# Patient Record
Sex: Male | Born: 1940 | Race: White | Hispanic: No | Smoking: Former smoker
Health system: Southern US, Community
[De-identification: ages and names within clinical notes are randomized; demographics above are authoritative.]

## PROBLEM LIST (undated history)

## (undated) DIAGNOSIS — N186 End stage renal disease: Secondary | ICD-10-CM

## (undated) DIAGNOSIS — Z936 Other artificial openings of urinary tract status: Secondary | ICD-10-CM

## (undated) DIAGNOSIS — D649 Anemia, unspecified: Secondary | ICD-10-CM

## (undated) DIAGNOSIS — N2581 Secondary hyperparathyroidism of renal origin: Secondary | ICD-10-CM

## (undated) DIAGNOSIS — I351 Nonrheumatic aortic (valve) insufficiency: Secondary | ICD-10-CM

## (undated) DIAGNOSIS — I209 Angina pectoris, unspecified: Secondary | ICD-10-CM

## (undated) DIAGNOSIS — D631 Anemia in chronic kidney disease: Secondary | ICD-10-CM

## (undated) DIAGNOSIS — N029 Recurrent and persistent hematuria with unspecified morphologic changes: Secondary | ICD-10-CM

## (undated) DIAGNOSIS — I340 Nonrheumatic mitral (valve) insufficiency: Secondary | ICD-10-CM

## (undated) DIAGNOSIS — N189 Chronic kidney disease, unspecified: Secondary | ICD-10-CM

## (undated) DIAGNOSIS — J189 Pneumonia, unspecified organism: Secondary | ICD-10-CM

## (undated) DIAGNOSIS — K529 Noninfective gastroenteritis and colitis, unspecified: Secondary | ICD-10-CM

## (undated) DIAGNOSIS — R011 Cardiac murmur, unspecified: Secondary | ICD-10-CM

## (undated) DIAGNOSIS — C679 Malignant neoplasm of bladder, unspecified: Secondary | ICD-10-CM

## (undated) DIAGNOSIS — I10 Essential (primary) hypertension: Secondary | ICD-10-CM

## (undated) DIAGNOSIS — R7303 Prediabetes: Secondary | ICD-10-CM

## (undated) DIAGNOSIS — M545 Low back pain, unspecified: Secondary | ICD-10-CM

## (undated) DIAGNOSIS — G8929 Other chronic pain: Secondary | ICD-10-CM

## (undated) DIAGNOSIS — I350 Nonrheumatic aortic (valve) stenosis: Secondary | ICD-10-CM

## (undated) DIAGNOSIS — I214 Non-ST elevation (NSTEMI) myocardial infarction: Secondary | ICD-10-CM

## (undated) DIAGNOSIS — Z8679 Personal history of other diseases of the circulatory system: Secondary | ICD-10-CM

## (undated) DIAGNOSIS — Z87442 Personal history of urinary calculi: Secondary | ICD-10-CM

## (undated) DIAGNOSIS — Z9289 Personal history of other medical treatment: Secondary | ICD-10-CM

## (undated) DIAGNOSIS — F419 Anxiety disorder, unspecified: Secondary | ICD-10-CM

## (undated) HISTORY — PX: PROSTATECTOMY: SHX69

## (undated) HISTORY — DX: Secondary hyperparathyroidism of renal origin: N25.81

## (undated) HISTORY — DX: Malignant neoplasm of bladder, unspecified: C67.9

## (undated) HISTORY — DX: Personal history of other diseases of the circulatory system: Z86.79

## (undated) HISTORY — DX: Essential (primary) hypertension: I10

## (undated) HISTORY — PX: TONSILLECTOMY: SUR1361

## (undated) HISTORY — PX: CYSTECTOMY: SUR359

---

## 2005-12-23 HISTORY — PX: ARTERIOVENOUS GRAFT PLACEMENT: SUR1029

## 2005-12-23 HISTORY — PX: URETHRECTOMY: SHX1080

## 2006-01-02 ENCOUNTER — Inpatient Hospital Stay (HOSPITAL_COMMUNITY): Admission: EM | Admit: 2006-01-02 | Discharge: 2006-01-14 | Payer: Self-pay | Admitting: Emergency Medicine

## 2006-01-02 ENCOUNTER — Ambulatory Visit: Payer: Self-pay | Admitting: Internal Medicine

## 2006-01-03 ENCOUNTER — Encounter: Payer: Self-pay | Admitting: Internal Medicine

## 2006-01-04 ENCOUNTER — Ambulatory Visit: Payer: Self-pay | Admitting: Pulmonary Disease

## 2006-01-10 ENCOUNTER — Encounter (INDEPENDENT_AMBULATORY_CARE_PROVIDER_SITE_OTHER): Payer: Self-pay | Admitting: *Deleted

## 2006-01-20 ENCOUNTER — Inpatient Hospital Stay (HOSPITAL_COMMUNITY): Admission: RE | Admit: 2006-01-20 | Discharge: 2006-01-30 | Payer: Self-pay | Admitting: Urology

## 2006-01-21 ENCOUNTER — Encounter (INDEPENDENT_AMBULATORY_CARE_PROVIDER_SITE_OTHER): Payer: Self-pay | Admitting: Specialist

## 2007-12-19 ENCOUNTER — Encounter: Admission: RE | Admit: 2007-12-19 | Discharge: 2007-12-19 | Payer: Self-pay | Admitting: Nephrology

## 2008-04-11 ENCOUNTER — Ambulatory Visit (HOSPITAL_COMMUNITY): Admission: RE | Admit: 2008-04-11 | Discharge: 2008-04-11 | Payer: Self-pay | Admitting: Urology

## 2008-05-03 ENCOUNTER — Ambulatory Visit (HOSPITAL_COMMUNITY): Admission: RE | Admit: 2008-05-03 | Discharge: 2008-05-03 | Payer: Self-pay | Admitting: Urology

## 2008-05-17 ENCOUNTER — Encounter (HOSPITAL_COMMUNITY): Admission: RE | Admit: 2008-05-17 | Discharge: 2008-08-15 | Payer: Self-pay | Admitting: Nephrology

## 2008-08-23 ENCOUNTER — Encounter (HOSPITAL_COMMUNITY): Admission: RE | Admit: 2008-08-23 | Discharge: 2008-10-24 | Payer: Self-pay | Admitting: Nephrology

## 2008-11-22 ENCOUNTER — Encounter (HOSPITAL_COMMUNITY): Admission: RE | Admit: 2008-11-22 | Discharge: 2009-02-14 | Payer: Self-pay | Admitting: Nephrology

## 2009-03-14 ENCOUNTER — Encounter (HOSPITAL_COMMUNITY): Admission: RE | Admit: 2009-03-14 | Discharge: 2009-06-12 | Payer: Self-pay | Admitting: Nephrology

## 2009-06-20 ENCOUNTER — Encounter (HOSPITAL_COMMUNITY): Admission: RE | Admit: 2009-06-20 | Discharge: 2009-09-18 | Payer: Self-pay | Admitting: Nephrology

## 2009-10-10 ENCOUNTER — Encounter (HOSPITAL_COMMUNITY): Admission: RE | Admit: 2009-10-10 | Discharge: 2010-01-08 | Payer: Self-pay | Admitting: Nephrology

## 2010-01-16 ENCOUNTER — Encounter (HOSPITAL_COMMUNITY): Admission: RE | Admit: 2010-01-16 | Discharge: 2010-04-16 | Payer: Self-pay | Admitting: Nephrology

## 2010-05-08 ENCOUNTER — Encounter (HOSPITAL_COMMUNITY): Admission: RE | Admit: 2010-05-08 | Discharge: 2010-08-06 | Payer: Self-pay | Admitting: Nephrology

## 2010-08-14 ENCOUNTER — Encounter (HOSPITAL_COMMUNITY)
Admission: RE | Admit: 2010-08-14 | Discharge: 2010-10-27 | Payer: Self-pay | Source: Home / Self Care | Attending: Nephrology | Admitting: Nephrology

## 2011-01-07 LAB — RENAL FUNCTION PANEL
BUN: 57 mg/dL — ABNORMAL HIGH (ref 6–23)
Calcium: 9.3 mg/dL (ref 8.4–10.5)
Calcium: 9.4 mg/dL (ref 8.4–10.5)
Creatinine, Ser: 5.54 mg/dL — ABNORMAL HIGH (ref 0.4–1.5)
Creatinine, Ser: 5.59 mg/dL — ABNORMAL HIGH (ref 0.4–1.5)
GFR calc Af Amer: 12 mL/min — ABNORMAL LOW (ref >60)
GFR calc non Af Amer: 10 mL/min — ABNORMAL LOW (ref >60)
Glucose, Bld: 233 mg/dL — ABNORMAL HIGH (ref 70–99)
Phosphorus: 3 mg/dL (ref 2.3–4.6)
Phosphorus: 3.1 mg/dL (ref 2.3–4.6)
Sodium: 136 mEq/L (ref 135–145)
Sodium: 136 mEq/L (ref 135–145)

## 2011-01-07 LAB — PTH, INTACT AND CALCIUM
Calcium, Total (PTH): 9.7 mg/dL (ref 8.4–10.5)
PTH: 20.2 pg/mL (ref 14.0–72.0)
PTH: 24.3 pg/mL (ref 14.0–72.0)

## 2011-01-07 LAB — POCT HEMOGLOBIN-HEMACUE: Hemoglobin: 11.6 g/dL — ABNORMAL LOW (ref 13.0–17.0)

## 2011-01-07 LAB — FERRITIN: Ferritin: 516 ng/mL — ABNORMAL HIGH (ref 22–322)

## 2011-01-07 LAB — IRON AND TIBC
Iron: 57 ug/dL (ref 42–135)
Iron: 86 ug/dL (ref 42–135)
TIBC: 280 ug/dL (ref 215–435)

## 2011-01-08 LAB — RENAL FUNCTION PANEL
Albumin: 3.5 g/dL (ref 3.5–5.2)
Chloride: 102 mEq/L (ref 96–112)
Creatinine, Ser: 5.58 mg/dL — ABNORMAL HIGH (ref 0.4–1.5)
GFR calc Af Amer: 12 mL/min — ABNORMAL LOW (ref >60)
GFR calc non Af Amer: 10 mL/min — ABNORMAL LOW (ref >60)
Phosphorus: 3.3 mg/dL (ref 2.3–4.6)
Potassium: 4.4 mEq/L (ref 3.5–5.1)

## 2011-01-08 LAB — PTH, INTACT AND CALCIUM
Calcium, Total (PTH): 9.9 mg/dL (ref 8.4–10.5)
PTH: 16.6 pg/mL (ref 14.0–72.0)

## 2011-01-09 LAB — RENAL FUNCTION PANEL
Albumin: 3.7 g/dL (ref 3.5–5.2)
CO2: 29 mEq/L (ref 19–32)
Calcium: 9.4 mg/dL (ref 8.4–10.5)
Creatinine, Ser: 5.38 mg/dL — ABNORMAL HIGH (ref 0.4–1.5)
GFR calc Af Amer: 13 mL/min — ABNORMAL LOW (ref >60)
GFR calc non Af Amer: 11 mL/min — ABNORMAL LOW (ref >60)
Sodium: 136 mEq/L (ref 135–145)

## 2011-01-09 LAB — FERRITIN: Ferritin: 596 ng/mL — ABNORMAL HIGH (ref 22–322)

## 2011-01-09 LAB — IRON AND TIBC: Iron: 65 ug/dL (ref 42–135)

## 2011-01-09 LAB — POCT HEMOGLOBIN-HEMACUE: Hemoglobin: 11.4 g/dL — ABNORMAL LOW (ref 13.0–17.0)

## 2011-01-09 LAB — PTH, INTACT AND CALCIUM: Calcium, Total (PTH): 9.5 mg/dL (ref 8.4–10.5)

## 2011-01-10 LAB — RENAL FUNCTION PANEL
Albumin: 3.8 g/dL (ref 3.5–5.2)
CO2: 27 mEq/L (ref 19–32)
CO2: 32 mEq/L (ref 19–32)
Calcium: 9.4 mg/dL (ref 8.4–10.5)
Chloride: 98 mEq/L (ref 96–112)
Creatinine, Ser: 6.12 mg/dL — ABNORMAL HIGH (ref 0.4–1.5)
GFR calc Af Amer: 11 mL/min — ABNORMAL LOW (ref >60)
GFR calc non Af Amer: 9 mL/min — ABNORMAL LOW (ref >60)
Glucose, Bld: 236 mg/dL — ABNORMAL HIGH (ref 70–99)
Potassium: 4.6 mEq/L (ref 3.5–5.1)
Sodium: 138 mEq/L (ref 135–145)
Sodium: 137 mEq/L (ref 135–145)

## 2011-01-10 LAB — IRON AND TIBC
Iron: 69 ug/dL (ref 42–135)
TIBC: 287 ug/dL (ref 215–435)
TIBC: 272 ug/dL (ref 215–435)

## 2011-01-10 LAB — POCT HEMOGLOBIN-HEMACUE
Hemoglobin: 13 g/dL (ref 13.0–17.0)
Hemoglobin: 11.5 g/dL — ABNORMAL LOW (ref 13.0–17.0)
Hemoglobin: 11.5 g/dL — ABNORMAL LOW (ref 13.0–17.0)

## 2011-01-10 LAB — FERRITIN
Ferritin: 483 ng/mL — ABNORMAL HIGH (ref 22–322)
Ferritin: 514 ng/mL — ABNORMAL HIGH (ref 22–322)

## 2011-01-11 LAB — RENAL FUNCTION PANEL
CO2: 31 mEq/L (ref 19–32)
Calcium: 9.2 mg/dL (ref 8.4–10.5)
Creatinine, Ser: 5.24 mg/dL — ABNORMAL HIGH (ref 0.4–1.5)
GFR calc Af Amer: 13 mL/min — ABNORMAL LOW (ref >60)
Glucose, Bld: 230 mg/dL — ABNORMAL HIGH (ref 70–99)

## 2011-01-11 LAB — FERRITIN: Ferritin: 455 ng/mL — ABNORMAL HIGH (ref 22–322)

## 2011-01-11 LAB — IRON AND TIBC
Iron: 62 ug/dL (ref 42–135)
TIBC: 248 ug/dL (ref 215–435)
UIBC: 186 ug/dL

## 2011-01-12 LAB — RENAL FUNCTION PANEL
Albumin: 3.4 g/dL — ABNORMAL LOW (ref 3.5–5.2)
BUN: 48 mg/dL — ABNORMAL HIGH (ref 6–23)
Creatinine, Ser: 5.53 mg/dL — ABNORMAL HIGH (ref 0.4–1.5)
Phosphorus: 3.4 mg/dL (ref 2.3–4.6)

## 2011-01-12 LAB — IRON AND TIBC
Saturation Ratios: 23 % (ref 20–55)
UIBC: 202 ug/dL

## 2011-01-12 LAB — PTH, INTACT AND CALCIUM
Calcium, Total (PTH): 9.2 mg/dL (ref 8.4–10.5)
PTH: 26.1 pg/mL (ref 14.0–72.0)

## 2011-01-12 LAB — FERRITIN: Ferritin: 435 ng/mL — ABNORMAL HIGH (ref 22–322)

## 2011-01-13 LAB — RENAL FUNCTION PANEL
Albumin: 3.4 g/dL — ABNORMAL LOW (ref 3.5–5.2)
Chloride: 99 mEq/L (ref 96–112)
Creatinine, Ser: 5.61 mg/dL — ABNORMAL HIGH (ref 0.4–1.5)
GFR calc Af Amer: 12 mL/min — ABNORMAL LOW (ref >60)
GFR calc non Af Amer: 10 mL/min — ABNORMAL LOW (ref >60)
Phosphorus: 2.9 mg/dL (ref 2.3–4.6)
Potassium: 4.5 mEq/L (ref 3.5–5.1)

## 2011-01-13 LAB — FERRITIN: Ferritin: 427 ng/mL — ABNORMAL HIGH (ref 22–322)

## 2011-01-13 LAB — IRON AND TIBC: TIBC: 254 ug/dL (ref 215–435)

## 2011-01-17 LAB — RENAL FUNCTION PANEL
Albumin: 3.4 g/dL — ABNORMAL LOW (ref 3.5–5.2)
GFR calc Af Amer: 12 mL/min — ABNORMAL LOW (ref >60)
GFR calc non Af Amer: 10 mL/min — ABNORMAL LOW (ref >60)
Glucose, Bld: 231 mg/dL — ABNORMAL HIGH (ref 70–99)
Phosphorus: 3.2 mg/dL (ref 2.3–4.6)
Potassium: 4.6 mEq/L (ref 3.5–5.1)
Sodium: 137 mEq/L (ref 135–145)

## 2011-01-17 LAB — IRON AND TIBC: TIBC: 270 ug/dL (ref 215–435)

## 2011-01-17 LAB — POCT HEMOGLOBIN-HEMACUE: Hemoglobin: 11.8 g/dL — ABNORMAL LOW (ref 13.0–17.0)

## 2011-01-17 LAB — PTH, INTACT AND CALCIUM: PTH: 20.5 pg/mL (ref 14.0–72.0)

## 2011-01-25 LAB — RENAL FUNCTION PANEL
CO2: 28 mEq/L (ref 19–32)
Chloride: 99 mEq/L (ref 96–112)
GFR calc Af Amer: 12 mL/min — ABNORMAL LOW (ref >60)
GFR calc non Af Amer: 10 mL/min — ABNORMAL LOW (ref >60)
Glucose, Bld: 236 mg/dL — ABNORMAL HIGH (ref 70–99)
Potassium: 4.1 mEq/L (ref 3.5–5.1)
Sodium: 136 mEq/L (ref 135–145)

## 2011-01-25 LAB — POCT HEMOGLOBIN-HEMACUE: Hemoglobin: 11.5 g/dL — ABNORMAL LOW (ref 13.0–17.0)

## 2011-01-25 LAB — IRON AND TIBC: Saturation Ratios: 20 % (ref 20–55)

## 2011-01-27 LAB — IRON AND TIBC
Iron: 57 ug/dL (ref 42–135)
Saturation Ratios: 22 % (ref 20–55)
UIBC: 203 ug/dL

## 2011-01-27 LAB — FERRITIN: Ferritin: 280 ng/mL (ref 22–322)

## 2011-01-28 LAB — RENAL FUNCTION PANEL
Calcium: 9.8 mg/dL (ref 8.4–10.5)
GFR calc Af Amer: 12 mL/min — ABNORMAL LOW (ref >60)
GFR calc non Af Amer: 10 mL/min — ABNORMAL LOW (ref >60)
Glucose, Bld: 203 mg/dL — ABNORMAL HIGH (ref 70–99)
Phosphorus: 3.6 mg/dL (ref 2.3–4.6)
Potassium: 4.1 mEq/L (ref 3.5–5.1)
Sodium: 141 mEq/L (ref 135–145)

## 2011-01-28 LAB — IRON AND TIBC: Iron: 62 ug/dL (ref 42–135)

## 2011-01-28 LAB — FERRITIN: Ferritin: 426 ng/mL — ABNORMAL HIGH (ref 22–322)

## 2011-01-28 LAB — POCT HEMOGLOBIN-HEMACUE: Hemoglobin: 11.4 g/dL — ABNORMAL LOW (ref 13.0–17.0)

## 2011-01-29 LAB — RENAL FUNCTION PANEL
BUN: 53 mg/dL — ABNORMAL HIGH (ref 6–23)
CO2: 29 mEq/L (ref 19–32)
Chloride: 99 mEq/L (ref 96–112)
Creatinine, Ser: 5.77 mg/dL — ABNORMAL HIGH (ref 0.4–1.5)
Glucose, Bld: 221 mg/dL — ABNORMAL HIGH (ref 70–99)
Potassium: 4.3 mEq/L (ref 3.5–5.1)

## 2011-01-29 LAB — FERRITIN: Ferritin: 424 ng/mL — ABNORMAL HIGH (ref 22–322)

## 2011-01-29 LAB — IRON AND TIBC
Iron: 54 ug/dL (ref 42–135)
TIBC: 255 ug/dL (ref 215–435)

## 2011-01-30 LAB — FERRITIN: Ferritin: 415 ng/mL — ABNORMAL HIGH (ref 22–322)

## 2011-01-30 LAB — IRON AND TIBC
Iron: 53 ug/dL (ref 42–135)
Saturation Ratios: 20 % (ref 20–55)
TIBC: 267 ug/dL (ref 215–435)
UIBC: 214 ug/dL

## 2011-01-30 LAB — RENAL FUNCTION PANEL
Albumin: 3.3 g/dL — ABNORMAL LOW (ref 3.5–5.2)
BUN: 52 mg/dL — ABNORMAL HIGH (ref 6–23)
Creatinine, Ser: 5.68 mg/dL — ABNORMAL HIGH (ref 0.4–1.5)
Glucose, Bld: 252 mg/dL — ABNORMAL HIGH (ref 70–99)
Phosphorus: 3.3 mg/dL (ref 2.3–4.6)
Potassium: 4.7 mEq/L (ref 3.5–5.1)

## 2011-01-30 LAB — POCT HEMOGLOBIN-HEMACUE: Hemoglobin: 11.2 g/dL — ABNORMAL LOW (ref 13.0–17.0)

## 2011-01-31 LAB — RENAL FUNCTION PANEL
Albumin: 3.2 g/dL — ABNORMAL LOW (ref 3.5–5.2)
BUN: 50 mg/dL — ABNORMAL HIGH (ref 6–23)
Calcium: 9.5 mg/dL (ref 8.4–10.5)
Glucose, Bld: 163 mg/dL — ABNORMAL HIGH (ref 70–99)
Phosphorus: 4.4 mg/dL (ref 2.3–4.6)
Potassium: 4.3 mEq/L (ref 3.5–5.1)
Sodium: 140 mEq/L (ref 135–145)

## 2011-01-31 LAB — FERRITIN: Ferritin: 526 ng/mL — ABNORMAL HIGH (ref 22–322)

## 2011-01-31 LAB — IRON AND TIBC
Saturation Ratios: 19 % — ABNORMAL LOW (ref 20–55)
UIBC: 221 ug/dL

## 2011-01-31 LAB — POCT HEMOGLOBIN-HEMACUE
Hemoglobin: 10.2 g/dL — ABNORMAL LOW (ref 13.0–17.0)
Hemoglobin: 8.3 g/dL — ABNORMAL LOW (ref 13.0–17.0)
Hemoglobin: 8.3 g/dL — ABNORMAL LOW (ref 13.0–17.0)

## 2011-02-01 LAB — RENAL FUNCTION PANEL
CO2: 25 mEq/L (ref 19–32)
Calcium: 9.4 mg/dL (ref 8.4–10.5)
Creatinine, Ser: 6.51 mg/dL — ABNORMAL HIGH (ref 0.4–1.5)
GFR calc Af Amer: 10 mL/min — ABNORMAL LOW (ref >60)
GFR calc non Af Amer: 9 mL/min — ABNORMAL LOW (ref >60)
Phosphorus: 3.6 mg/dL (ref 2.3–4.6)
Sodium: 139 mEq/L (ref 135–145)

## 2011-02-01 LAB — IRON AND TIBC
Saturation Ratios: 17 % — ABNORMAL LOW (ref 20–55)
UIBC: 217 ug/dL

## 2011-02-03 LAB — RENAL FUNCTION PANEL
CO2: 26 mEq/L (ref 19–32)
Calcium: 9.3 mg/dL (ref 8.4–10.5)
GFR calc Af Amer: 11 mL/min — ABNORMAL LOW (ref >60)
GFR calc non Af Amer: 9 mL/min — ABNORMAL LOW (ref >60)
Phosphorus: 3.6 mg/dL (ref 2.3–4.6)
Potassium: 4.8 mEq/L (ref 3.5–5.1)
Sodium: 135 mEq/L (ref 135–145)

## 2011-02-03 LAB — IRON AND TIBC: TIBC: 250 ug/dL (ref 215–435)

## 2011-02-03 LAB — POCT HEMOGLOBIN-HEMACUE: Hemoglobin: 11.4 g/dL — ABNORMAL LOW (ref 13.0–17.0)

## 2011-02-04 LAB — RENAL FUNCTION PANEL
Albumin: 3.3 g/dL — ABNORMAL LOW (ref 3.5–5.2)
BUN: 58 mg/dL — ABNORMAL HIGH (ref 6–23)
Creatinine, Ser: 5.62 mg/dL — ABNORMAL HIGH (ref 0.4–1.5)
GFR calc Af Amer: 12 mL/min — ABNORMAL LOW (ref >60)
Phosphorus: 3.9 mg/dL (ref 2.3–4.6)

## 2011-02-09 LAB — RENAL FUNCTION PANEL
Albumin: 3.2 g/dL — ABNORMAL LOW (ref 3.5–5.2)
Chloride: 105 mEq/L (ref 96–112)
GFR calc Af Amer: 12 mL/min — ABNORMAL LOW (ref >60)
GFR calc non Af Amer: 10 mL/min — ABNORMAL LOW (ref >60)
Potassium: 4.3 mEq/L (ref 3.5–5.1)
Sodium: 136 mEq/L (ref 135–145)

## 2011-02-09 LAB — IRON AND TIBC: UIBC: 173 ug/dL

## 2011-02-09 LAB — POCT HEMOGLOBIN-HEMACUE: Hemoglobin: 10.7 g/dL — ABNORMAL LOW (ref 13.0–17.0)

## 2011-03-12 NOTE — Op Note (Signed)
NAME:  Matthew Mays, Matthew Mays NO.:  [id number]   MEDICAL RECORD NO.:  [id number]          PATIENT TYPE:  INP   LOCATION:  NA                           FACILITY:  [facility]   PHYSICIAN:  Di Kindle. Edilia Bo, M.D.DATE OF BIRTH:  August 27, 1941   DATE OF PROCEDURE:  01/13/2006  DATE OF DISCHARGE:                                 OPERATIVE REPORT   PREOPERATIVE DIAGNOSIS:  Chronic renal failure.   POSTOPERATIVE DIAGNOSIS:  Chronic renal failure.   PROCEDURE:  Placement of new left upper arm arteriovenous graft.   SURGEON:  Di Kindle. Edilia Bo, M.D.   ASSISTANT:  Coral Ceo, P.A.-C.   ANESTHESIA:  Local with sedation.   TECHNIQUE:  The patient was taken to the operating room, sedated by  anesthesia. The left upper extremity was prepped and draped in usual sterile  fashion. After the skin was infiltrated with 1% lidocaine, a transverse  incision was made just above the antecubital level.  Here, the cephalic vein  was dissected free and was very small and not usable for a fistula. The  brachial artery was dissected free beneath the fascia and was reasonable  although somewhat small. The veins were very small and I did not think he  was a candidate for a forearm graft. Therefore, a separate longitudinal  incision was made beneath the axilla after the skin was anesthetized. The  high brachial vein here was dissected free and was a good sized vein. A 4-7  mm graft was then tunneled between the two incisions and the patient was  then heparinized. The brachial artery was clamped proximally and distally  and a longitudinal arteriotomy was made. A short segment of the 4 mm end of  the graft was excised, the graft spatulated, and sewn end-to-side to the  brachial artery using continuous 6-0 Prolene suture. The graft was then  pulled to the appropriate length for anastomosis to the high brachial vein.  The vein was ligated distally and spatulated proximally. The graft was cut  to  the appropriate length, spatulated, and sewn end-to-end to the vein using  continuous 6-0 Prolene suture. At the completion, there was a good thrill in  the graft and a palpable radial pulse. Hemostasis was obtained. The wounds  were closed with a deep layer of 3-0 Vicryl and the skin closed with 4-0  Vicryl. A sterile dressing was applied. The patient tolerated procedure well  and was transferred to the recovery room in satisfactory condition. All  needle and sponge counts were correct.      Di Kindle. Edilia Bo, M.D.  Electronically Signed     CSD/MEDQ  D:  01/13/2006  T:  01/15/2006  Job:  [id number]

## 2011-03-12 NOTE — H&P (Signed)
NAME:  Matthew Mays, Matthew Mays NO.:  [id number]   MEDICAL RECORD NO.:  [id number]          PATIENT TYPE:  INP   LOCATION:  2103                         FACILITY:  [facility]   PHYSICIAN:  Zetta Bills, MD          DATE OF BIRTH:  1940/10/28   DATE OF ADMISSION:  01/02/2006  DATE OF DISCHARGE:                                HISTORY & PHYSICAL   HISTORY OF PRESENTING ILLNESS:  This is a 70 year old man with relatively  unremarkable past medical history other than a history of delirium tremens  from alcohol withdrawal 2 years ago and currently present glaucoma.  He has  not seen at regular doctor in 2 years.   He presents with a 4-day history of profound weakness, shortness of breath,  dyspnea on exertion, and shortness of breath when lying down.  He denies any  associated fever or chills.  He reports a near daily cough with scant white  sputum production and no changes to his sputum quality or quantity in the  recent past.  He does complain of mild chest pain, as well as epigastric  pain that resolves by itself.   He reports melena and hematochezia for the past 2-3 years, and reports that  he has been noticing external hemorrhoids.  He denies any associated  hematemesis, and also denies coffee ground emesis.  He reports some mild  dyspepsia, for which he takes Tums tablets as needed.   He also has diffuse arthralgias, for which he takes 2-4 tablets of Aleve a  day, and denies any associated aspirin, BC powder or Goody's powder use.   He is incontinent of urine and has been wearing diaper pads for the past 1-2  years.  He cannot tell me clearly when he last passed urine spontaneously.  He also has associated leg swelling for the past 2 years.   PAST MEDICAL HISTORY:  1.  History of delirium tremens in the past secondary to alcohol withdrawal      and no history of seizures.  2.  History of urinary incontinence for the past 2 years.  3.  History of melena and hematochezia for  the past 2-3 years.  4.  History of dyspepsia intermittently.   PAST SURGICAL HISTORY:  Not significant.   MEDICATIONS:  1.  Some eye drops for glaucoma, of which the name he cannot remember.  2.  Naproxen as needed.   ALLERGIES:  No known drug allergies.   FAMILY AND SOCIAL HISTORY:  The patient is a current smoker.  He has been  smoking cigarettes for the past 30 years, and smokes 2 packs a day of  cigarettes.  He is a former alcohol abuser, but has been abstinent for the  past 2 years.  He lives at home with his wife and has a supportive daughter  who checks up on him.   REVIEW OF SYSTEMS:  ENT:  He denies any epistaxis.  Denies congestion.  Denies rhinorrhea.  CARDIOVASCULAR:  He denies any angina.  Denies any  claudication.  NEUROLOGIC:  He reports  some occasional orthostatic  dizziness.  He reports some headaches, and also reports some numbness of his  arms and legs.   PHYSICAL EXAMINATION:  VITAL SIGNS:  Temperature of 94.8, pulse 96, blood  pressure 119/47 (orthostatic changes), respiratory rate 32, and he is on  BiPAP at 8/12 cm of water.  He is saturating 98% on this.  GENERAL:  He is in poor general condition.  Appears uncomfortable and  fatigued.  RESPIRATORY EVALUATION:  Distant breath sounds bilaterally, prolonged  expiration and bilateral rhonchi.  He h.s. no rales.  CARDIOVASCULAR:  Pulse is regular in rate and rhythm  Heart sounds S1 and S2  are normal with a grade 3/6 ejection systolic murmur over the apex and left  lateral sternal border.  ABDOMEN:  Soft, flat, nontender, nondistended, and bowel sounds are normal.  EXTREMITIES:  He has trace edema bipedally.  NEUROLOGIC:  He is oriented to time, person and place.  Cranial nerves  appear normal on gross evaluation, and he is able to move all 4 extremities  symmetrically.   ADMISSION LABORATORIES:  Sodium of 135, potassium 7.1, chloride 107,  bicarbonate 8, BUN 140, creatinine of 13, glucose 203.  He has an  anion gap  of 20.  Total bilirubin of 1.  Alkaline phosphatase of 93.  AST of 63, ALT  of 35.  Total protein 5.5 and albumin 2.3.  Hemoglobin of 2.6, hematocrit of  8.3.  White cell count of 16.3.  Platelets of 672.  He has an MCV of 82.  Red cell distribution weight of 21%.  Lipase of 119.  Arterial blood gas  shows a pH of 7.22, PCO2 of 20, PO2 of 287, bicarbonate of 8, and oxygen  saturation of 100%.   Chest x-ray shows mild cardiomegaly with a right infrahilar infiltrate  versus scar atelectasis.   ASSESSMENT AND PLAN:  1.  Respiratory distress.  An arterial blood gas has been checked, and based      on his clinical criteria, the patient will undergo endotracheal      intubation for ventilator support.  He is currently on BiPAP, but it is      anticipated that he will get progressively fatigued, and with worsening      of shortness of breath due to metabolic acidosis.  The patient will be      intubated any supported on the ventilator for a more stable medical      course.  From his chest x-ray and lung exam, as well as history, he has      no acute intrapulmonary pathology going on, but with this profound      anemia that he presents with, he likely has a supply to demand (V/Q)      mismatch.  2.  Anemia.  This is likely a combination of ongoing GI loss, as well as      preexisting chronic kidney disease.  The patient will be transfused with      2 units of packed red blood cells that are O negative in order to      rapidly stabilize him.  This was accomplished by the emergency room      prior to my arrival there.  The patient will be closely monitored for      his volume status, as well as his potassium concentration.  Guaiac exam      of this patient was positive for dark greasy stools.  Rectal exam also  showed a diffusely enlarged smooth prostate gland.  The patient will      lateral on need erythropoietin and intravenous iron, depending on his     iron studies.  3.   Elevated BUN and creatinine.  Based on his history, as well as      concomitant albumin and anemia that is evident on admission, the patient      likely has chronic kidney disease with an acute exacerbation.  A Foley      catheter was placed in this patient and obtained 1.5 liters of cloudy      dark yellow urine.  This my indicate that he may have an obstructive      uropathy, and it will be verified by obtaining a renal ultrasound after      he is more medically stable.  The presence of an analgesic nephropathy      or a primary glomerulonephritis cannot be ruled out at this time.  A      renal consult was obtained on an earlier basis, and Dr. Arlean Hopping was      promptly informed, and he came and placed a right femoral double lumen      catheter for urgent intermittent hemodialysis, in order to help with the      reduction of his uremia, as well s improvement of his hyperkalemia.  4.  Hyperkalemia, likely secondary to chronic kidney disease/renal      insufficiency.  The patient was acutely stabilized in the emergency room      using intravenous calcium gluconate, intravenous bicarbonate, and      intravenous 50% dextrose and insulin in order to acutely decrease his      potassium.  Kayexalate enema 50 gm was prescribed while in the emergency      room, but this was not received by the patient before transfer to the      intensive care unit.  Definitive management was accomplished with      hemodialysis.  5.  Past history of ethanol abuse.  The patient will be empirically started      on intravenous thiamine and folic acid.  6.  Prophylaxis.  For deep vein thrombosis, the patient will be started on      PS hose, and for GI prophylaxis, he will      be started on proton pump inhibitors initially intravenously twice a day      because of his risk of uremic gastropathy, and this can later be      transitioned over to once a day and converted to oral route when he able      to take  so.      Zetta Bills, MD     JP/MEDQ  D:  01/02/2006  T:  01/03/2006  Job:  [id number]

## 2011-03-12 NOTE — Discharge Summary (Signed)
NAME:  Matthew Mays NO.:  [id number]   MEDICAL RECORD NO.:  [id number]          PATIENT TYPE:  INP   LOCATION:  5530                         FACILITY:  [facility]   PHYSICIAN:  Sherin Quarry, MD      DATE OF BIRTH:  1941/07/15   DATE OF ADMISSION:  01/02/2006  DATE OF DISCHARGE:                                 DISCHARGE SUMMARY   Matthew Mays is a 70 year old man with a past history of prolonged alcohol  abuse and delirium tremens but no recent history of alcohol abuse. He had  not seen a physician for a number of years. He presented on January 02, 2006  with a four-day history of weakness, shortness of breath, dyspnea on  exertion and nausea. He had been taking a number of tablets of Aleve, BC  powders and Goody powders because of diffuse arthralgias. He had been  chronically incontinent of urine and been wearing diapers because of this  for the last two years.   On physical exam, he was noted to have a temperature 94.8. His blood  pressure was 119/47. He was experiencing respiratory distress and was on  BiPAP. Chest exam revealed diminished breath sounds diffusely with prolonged  expiratory wheezes and rhonchi. Cardiovascular exam revealed a grade 3/6  systolic murmur. The abdomen was flat. It was nondistended. Bowel sounds  were normal. Neurologic testing and examination of the extremities was  normal.   Potassium was 7.1, bicarbonate was 8, creatinine was 13, BUN was 140. White  cell count was 16,300. Arterial blood gas showed pH of 7.22, pCO2 of 20, pO2  of 287, bicarbonate of 8.   Because of the profound respiratory distress, the patient was intubated and  placed on a ventilator. He was transfused two units of blood. A Foley  catheter was placed because of concern about bladder outlet obstruction and  1.5 liters of dark urine was drained. In light of his hyperkalemia, he was  given intravenous calcium bicarbonate, dextrose insulin, and Kayexalate  enema was  given. A consultation was obtained from Dr. Arlean Hopping of the renal  service. He recommended the patient receive acute hemodialysis. Dialysis was  administered on the day of admission. The patient was carefully monitored in  the critical care unit.   By January 05, 2006, the patient was extubated and was not experiencing any  respiratory distress. He was seen in consultation by the urology service and  it was felt that eventually he was going to need a cystoscopy. CT scan of  the abdomen and pelvis was performed which did not show any surprising  findings. It showed that the bladder was very thick walled and irregular  with calcifications within the bladder wall. It was thought that this could  represent a transitional cell carcinoma. The patient's renal function slowly  improved. By January 05, 2006, the creatinine was down to 7.3. By January 07, 2006, it was down to 6.1. The patient became increasingly alert and was able  to tolerate a regular diet. Unfortunately, the creatinine seemed to plateau  at about 6.1. On  January 10, 2006, the patient was transported to [facility]  and received a cystoscopy with biopsy. At this point, we are awaiting the  results of the cystoscopy which perhaps will be back by Wednesday.   DIAGNOSES:  1.  As of January 10, 2006, bladder outlet obstruction secondary to diffusely      calcified poorly functioning bladder.  2.  Acute renal failure secondary to bladder outlet obstruction.  3.  Acute respiratory failure secondary to renal failure.  4.  Anemia secondary to renal failure.  5.  Severe hyperkalemia secondary to renal failure.  6.  History of alcohol abuse with delirium tremens in the past.  7.  A 100 pack-year smoking history.  8.  Chronic obstructive pulmonary disease.   MEDICATIONS AT THE TIME OF THIS DICTATION:  1.  Oracit solution 30 mL t.i.d.  2.  Protonix 40 milligrams b.i.d.  3.  Ferrous sulfate 325 milligrams every 8 hours.  4.  Calcium carbonate 500  milligrams 3 tablets t.i.d.  5.  Nephro-Vite one daily/  6.  Aranesp 200 mcg weekly.  7.  Hectorol 2.5 mcg daily.  8.  Sliding scale insulin.  9.  Albuterol by nebulization q.4h. p.r.n.   The patient's condition is fair. Subsequent treatments are pending.           ______________________________  Sherin Quarry, MD     SY/MEDQ  D:  01/10/2006  T:  01/11/2006  Job:  [id number]   cc:   Danice Goltz, M.D. [facility]  [address] [place], [place] [address]   Dyke Maes, M.D.  Fax: [phone]

## 2011-03-12 NOTE — Discharge Summary (Signed)
NAME:  Matthew Mays, Matthew Mays                  ACCOUNT NO.:  [id number]   MEDICAL RECORD NO.:  [id number]          PATIENT TYPE:  INP   LOCATION:  5530                         FACILITY:  [facility]   PHYSICIAN:  Kela Millin, M.D.DATE OF BIRTH:  12-20-1940   DATE OF ADMISSION:  01/02/2006  DATE OF DISCHARGE:  01/14/2006                                 DISCHARGE SUMMARY   ADDENDUM:  This is an addendum to the hospital course.  The patient was seen  by [facility] and Dr. Edilia Bo did a left upper extremity A-V graft on January 13, 2006.  The patient tolerated the procedure without any complications.  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  Dr. Earlene Plater, urologist, saw the patient and discussed a  cystectomy and ileal loop in detail with the patient and he agreed to  proceed.  Dr.  Earlene Plater stated that the patient will be readmitted to the  hospital on Thursday, January 20, 2006, which is the day before the surgery  for bowel prep.  The surgery was scheduled for Friday, January 21, 2006.  The  patient was to go home with his Foley catheter and he was instructed on how  to irrigate it prior to discharge.  Per Dr. Earlene Plater, his office to handle the  logistics of the surgery and okay to discharge the patient.  Nephrology also  saw the patient today and stated that it was okay to discharge the patient  home and that they will see the patient back again when he is readmitted to  the hospital on Thursday, January 20, 2006.  The patient has remained afebrile  and hemodynamically stable and he will be discharged at this time to follow  up as already discussed.   DISCHARGE MEDICATIONS:  1.  Cipro one p.o. b.i.d. beginning Monday, January 17, 2006.  2.  Oracit 30 cc t.i.d.  3.  Hectorol 2.5 mcg one daily.  4.  Protonix 40 mg one b.i.d.  5.  Nephro-Vite one p.o. daily.  6.  Iron 325 p.o. t.i.d.  7.  Albuterol MDI two puffs q.4-6h. p.r.n.  8.  Percocet 5 mg, 1-2 tablets p.o.  q.4-6h. p.r.n.   FOLLOWUP CARE:  1.  Dr. Earlene Plater as above.  2.  Primary care physician in one week.  3.  Nephrology as above.   DISCHARGE CONDITION:  Improved, stable.   DISCHARGE DIET:  Renal diet.      Kela Millin, M.D.  Electronically Signed     ACV/MEDQ  D:  01/14/2006  T:  01/14/2006  Job:  [id number]   cc:   Dyke Maes, M.D.  Fax: [phone]   Lucrezia Starch. Earlene Plater, M.D.  Fax: [phone]

## 2011-03-12 NOTE — Consult Note (Signed)
NAME:  Matthew Mays, Matthew Mays NO.:  [id number]   MEDICAL RECORD NO.:  [id number]          PATIENT TYPE:  INP   LOCATION:  2103                         FACILITY:  [facility]   PHYSICIAN:  Maree Krabbe, M.D.DATE OF BIRTH:  27-Aug-1941   DATE OF CONSULTATION:  01/02/2006  DATE OF DISCHARGE:                                   CONSULTATION   REFERRING PHYSICIAN:  Dr. Danice Goltz.   PRIMARY CARE PHYSICIAN:  Dr. Jayme Cloud.   REASON FOR CONSULTATION:  Elevated creatinine.   HISTORY:  The patient is a 70 year old white male with prior history of  alcohol abuse and delirium tremens with no drinking in the last 2 years.  He  has not seen a doctor for 2 years.  He has had progressive fatigue,  shortness of breath and dyspnea on exertion.  When he presented to the  emergency room today, he was found to have profound azotemia and profound  anemia, also hyperkalemia and severe metabolic acidosis.  The patient was  intubated and admitted to the ICU.  Foley catheter was placed with some  difficulty and returned over 1 L of urine, which was brown with lots of  sediment and turbid.  The patient, before he was intubated, reported some  bloody stools off and on for the past 2 years, some dyspepsia and  arthralgias for which he takes 2-4 Aleve per day.   PAST MEDICAL HISTORY:  1.  Alcohol withdrawal and DTs in the past, no alcohol abuse for the last 2      years.  2.  History of glaucoma.   PAST SURGICAL HISTORY:  None.   MEDICATIONS:  P.r.n. NSAIDS.   ALLERGIES:  None.   SOCIAL HISTORY:  Two-pack-a-day smoker for 30 years, former alcohol abuse.  Lives at home with wife, supportive daughter.   REVIEW OF SYSTEMS:  GENERAL:  No fever, chills or sweats.  ENT:  No  epistaxis, sore throat or difficulty swallowing.  CARDIORESPIRATORY:  No  chest pain, otherwise as above.  GI:  No nausea, vomiting or diarrhea.  GU:  He has been incontinent of urine for over a year.  NEUROLOGIC:  No  history  of stroke, TIA or seizure.  No focal numbness or weakness.  ENDOCRINE:  No  heat or cold intolerance.   PHYSICAL EXAMINATION:  VITAL SIGNS:  Temperature 94.8, pulse 96,  respirations 32, prior to intubation, blood pressure 120/50.  GENERAL:  This is a disheveled-appearing older male who is currently on the  ventilator  SKIN:  Without rash.  HEENT:  PERRL.  EOMI.  NECK:  No JVD.  CHEST:  Clear throughout.  CARDIAC:  Regular rate and rhythm with a 2/6 systolic ejection murmur, no  rub or gallop.  ABDOMEN:  Soft, flat and nontender.  Active bowel sounds.  No organomegaly.  EXTREMITIES:  No edema.  No ulceration.  Good distal perfusion in the feet.  NEUROLOGIC:  Moves all 4 extremities symmetrically.  He is lethargic and  confused.   LABORATORY DATA:  Sodium 135, potassium 7.1, CO2 8, BUN 140, creatinine 13,  anion gap 20.  Hemoglobin 2.6, hematocrit 8%, white blood count 16,000.  Blood gas:  7.22/20/287.  Albumin 2.3.   RADIOLOGIC FINDINGS:  Chest x-ray:  No acute disease.   IMPRESSION:  1.  Severe azotemia of uncertain duration, possible obstructive nephropathy      due to large prostate.  2.  Urinary retention with enlarged prostate on exam by the resident.  Over      1 L of urine on initial Foley catheter placement.  3.  Severe anemia due to possible chronic kidney disease and possible      gastrointestinal bleed.  4.  Severe hyperkalemia with QRS prolongation.  5.  Metabolic acidosis.   RECOMMENDATIONS:  1.  Foley catheter is already in place.  Acute treatment for increased      potassium has been given with insulin, glucose, bicarb and IV calcium.  2.  Renal ultrasound and urinalysis to start the renal workup and look for      chronic hydronephrosis.  3.  Acute hemodialysis with transfusion for life-threatening hyperkalemia      and metabolic acidosis.      Maree Krabbe, M.D.  Electronically Signed     RDS/MEDQ  D:  01/02/2006  T:  01/04/2006  Job:   [id number]

## 2011-03-12 NOTE — Op Note (Signed)
NAME:  Matthew Mays, Matthew Mays NO.:  [id number]   MEDICAL RECORD NO.:  [id number]          PATIENT TYPE:  INP   LOCATION:  5530                         FACILITY:  [facility]   PHYSICIAN:  Ronald L. Earlene Plater, M.D.  DATE OF BIRTH:  10-26-1940   DATE OF PROCEDURE:  01/10/2006  DATE OF DISCHARGE:                                 OPERATIVE REPORT   PREOPERATIVE DIAGNOSIS:  Renal failure, urinary retention, gross hematuria,  calcified inner mucosa of the bladder on CT scan, bilateral hydronephrosis.   POSTOPERATIVE DIAGNOSIS:  Renal failure, urinary retention, gross hematuria,  calcified inner mucosa of the bladder on CT scan, bilateral hydronephrosis.   OPERATION PERFORMED:  Cystourethroscopy, irrigation of bladder debris and  bladder biopsy.   SURGEON:  Lucrezia Starch. Earlene Plater, M.D.   ANESTHESIA:  MAC.   ESTIMATED BLOOD LOSS:  30 mL.   COMPLICATIONS:  None.   TUBES:  23 French Foley.   INDICATIONS FOR PROCEDURE:  [name] is a very nice 70 year old white male  who presented in renal failure.  He was found to have bilateral severe  hydronephrosis with cortical atrophy and urinary retention.  A catheter was  passed and on CT scanning he was found to have diffuse mucosal inner lining  of his bladder with very thick walled bladder and bilateral hydronephrosis.  His creatinine is stabilized at 6.  After understanding risks, benefits and  alternatives, he has elected to proceed with cysto with bladder biopsy.   DESCRIPTION OF PROCEDURE:  The patient was placed in supine position and  after proper MAC analgesia, was placed in the dorsal lithotomy position and  prepped and draped with Betadine in sterile fashion.  Cystourethroscopy was  performed.  He really did not appear to have obstruction at all from the  prostatic urethra and the bladder was full of what appeared to be necrotic  debris with much calcification which was felt to be dystrophic  calcification.  Utilizing a Toomey syringe,   it was irrigated out and  appeared to be a combination of tissue and calcification and was submitted  to pathology.  Inspection revealed a diffusely inflamed thickened bladder  with oozing throughout.  The trigone could not even be visualized.  Therefore biopsy was taken at the posterior midline and the base was  cauterized with Bovie coagulation cautery.  I felt there was nothing else to  do at that point. Therefore, the  panendoscope was removed and a 22 [place] Foley catheter was passed with 15  mL in its balloon and the bladder was irrigated to a pink color.  It was  felt that there were no significant bleeders, it was just diffuse oozing and  that no further irrigation was indicated.  The patient was taken to the  recovery room stable.      Ronald L. Earlene Plater, M.D.  Electronically Signed     RLD/MEDQ  D:  01/10/2006  T:  01/11/2006  Job:  [id number]   cc:   Fayrene Fearing L. Deterding, M.D.  Fax: [phone]

## 2011-03-12 NOTE — Consult Note (Signed)
NAME:  Matthew Mays, Matthew Mays NO.:  [id number]   MEDICAL RECORD NO.:  [id number]          PATIENT TYPE:  INP   LOCATION:  5530                         FACILITY:  [facility]   PHYSICIAN:  Di Kindle. Edilia Bo, M.D.DATE OF BIRTH:  1941/01/08   DATE OF CONSULTATION:  01/11/2006  DATE OF DISCHARGE:                                   CONSULTATION   REASON FOR CONSULTATION:  Need for hemodialysis access. Consult is from Dr.  Darrick Penna.   HISTORY:  This is a pleasant 70 year old gentleman who was admitted on January 02, 2006. He was found to have acute on chronic renal insufficiency.  Vascular surgery was consulted for placement of long-term hemodialysis  access. Of note he is right-handed.   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 He denies any history of diabetes, hypertension,  hypercholesterolemia, history of previous cardiac disease.   ALLERGIES:  He has no known drug allergies.   REVIEW OF SYSTEMS:  He has had no recent chest pain, chest pressure,  palpitations or arrhythmias. He has had no leg pain or arm pain.   PHYSICAL EXAMINATION:  Temperature is 97.8, blood pressure 106/72. Lungs are  clear bilaterally to auscultation. On vascular exam, he has palpable  brachial and radial pulse bilaterally. He appears to have a usable forearm  cephalic vein on the left.   We will map his cephalic vein on the left arm and evaluate him for possible  AV fistula placement. If the vein is not adequate, then we will place an AV  graft in the left arm. I have also explained that we may have to make a  decisions at the time of surgery. I have discussed the indications for  surgery and potential complications including but not limited to bleeding,  arm swelling, failure of the fistula to mature, graft thrombosis, graft  infection, and steal syndrome. All of his questions were answered and he is  agreeable to proceed. His surgery  has been scheduled for Thursday January 13, 2006.      Di Kindle. Edilia Bo, M.D.  Electronically Signed     CSD/MEDQ  D:  01/11/2006  T:  01/12/2006  Job:  [id number]

## 2011-03-12 NOTE — Consult Note (Signed)
NAME:  Matthew Mays, BASSO NO.:  [id number]   MEDICAL RECORD NO.:  [id number]          PATIENT TYPE:  INP   LOCATION:  1432                         FACILITY:  [facility]   PHYSICIAN:  Terrial Rhodes, M.D.DATE OF BIRTH:  December 24, 1940   DATE OF CONSULTATION:  DATE OF DISCHARGE:                                   CONSULTATION   REASON FOR CONSULTATION:  Chronic renal failure.   HISTORY OF PRESENT ILLNESS:  Matthew Mays is a 70 year old white male who was  admitted to [facility] on January 02, 2005 when he was noted to have  acute-on-chronic renal failure as well as weakness, shortness of breath.  His creatinine was noted to be 13, BUN 140, potassium 7.1.  He also was in  respiratory distress.  He underwent dialysis emergently with volume removal  to treat his hyperkalemia.  Workup revealed significant bilateral  hydronephrosis with calcified mass in the region of the bladder, possibly  the prostate.  He subsequently underwent biopsy of the mass.  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  Has had chronic obstruction of his kidneys due  to this mass and subsequently developed progressive renal failure.  He has  been off of dialysis now for two weeks, and his most recent creatinine was  5.5.  He has advanced chronic kidney disease, likely due to chronic  obstruction from this malignancy.  He underwent an AV graft placement in his  left upper arm on January 13, 2006 by Dr. Edilia Bo.  He was admitted today by  Dr. Earlene Plater before they proceed with a cystectomy and ileal diversion.  Overall, he has been feeling well and has not been taking any pain  medications.  His Foley catheter was in place and draining bloody urine.   He has no known drug allergies.   PAST MEDICAL HISTORY:  1.  As above.  2.  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  3.  Bladder cancer.  4.  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  5.  Metabolic acidosis secondary to renal failure.  6.  Anemia.  7.  Secondary hypothyroidism.  8.  Status post left upper arm AV graft on January 13, 2006.  9.  Status post failure of his AV fistula that was placed on January 13, 2006.   CURRENT MEDICATIONS:  1.  Oracit 30 cc t.i.d.  2.  Hectorol 2.5 mcg daily.  3.  Protonix 40 mg b.i.d.  4.  Nephro-Vite 1 daily.  5.  Iron sulfate 325 mg a day.  6.  Albuterol metered dose inhalers p.r.n.  7.  Percocet p.r.n.   FAMILY HISTORY:  Noncontributory.  No family history of kidney disease.   SOCIAL HISTORY:  He lives at home with his wife.  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  He has a 60-pack-  year tobacco history but quit since his admission in March.  He quit alcohol  abuse three years ago.  No drugs.   REVIEW OF SYSTEMS:  In general, patient denies any anorexia, malaise.  OPHTHALMIC:  No blurred vision or photophobia.  CARDIAC:  No chest pain or  palpitations, orthopnea, PND.  PULMONARY:  No shortness of breath,  hemoptysis, productive cough.  GI:  No nausea or vomiting, hematochezia,  melena, bright red blood per rectum.  GU:  He has blood urine in his Foley  catheter.  All systems negative.   PHYSICAL EXAMINATION:  VITAL SIGNS:  Temperature 98.6, pulse 115, blood  pressure 114/72, respiratory rate 20.  GENERAL:  A frail, chronically ill-appearing man in no apparent distress.  HEENT:  Head normocephalic and atraumatic.  Pupils are equal, round and  reactive to light.  Extraocular muscles are intact.  No icterus.  Oropharynx  without lesions.  NECK:  Supple.  No lymphadenopathy.  No bruits.  LUNGS:  Clear to auscultation and percussion bilaterally.  No rales, rhonchi  or rub.  CARDIAC:  Regular rate and rhythm.  No precordial rub appreciated.  ABDOMEN:  He has a ventral hernia.  Normoactive bowel sounds.  Soft,  nontender, nondistended.  EXTREMITIES:  No clubbing, cyanosis  or edema.  He has a left upper arm AV  graft with palpable thrill.  No audible bruit.  No guarding or rebound.   LABS:  White blood cell count 7.6, hemoglobin 12.5, platelets 301.  Labs are  pending.   ASSESSMENT/PLAN:  1.  Bladder cancer with bladder outlet obstruction:  Biopsy revealed a      papillary urothelial carcinoma as well as the necrotic debris and      multiple areas of calcification.  He is due for a cystectomy and      diverting ileostomy tomorrow by Dr. Earlene Plater.  2.  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  Most      recent creatinine was 5.5 on January 14, 2006.  He has not had dialysis in      two weeks.  Given some return of his renal function.  Will await his      creatinine today.  If there are no uremic symptoms, will continue to      follow closely.  His AV graft is not ready for use.  3.  Vascular access:  As above.  AV graft, palpable thrill.  Audible bruit.      Only one week old.  4.  Hypertension:  Blood pressure is stable.  5.  Anemia:  We will continue to follow his hemoglobin postoperatively and      continue with iron supplements.  6.  Secondary hyperparathyroidism:  He is on vitamin D.  Will follow his      calcium and phosphorus, and then check PTH.  7.  Gastroesophageal reflux disease:  Continue his Protonix.  8.  Chronic obstructive pulmonary disease:  Will continue with albuterol      metered dose inhalers.  9.  Metabolic acidosis:  Will continue on Oracit.  10. Fluids, electrolytes, nutrition:  Will continue with his renal diet      until he is n.p.o. after midnight, at which time will adjust his IV      fluids as needed.  Will continue to follow his renal function.           ______________________________  Terrial Rhodes, M.D.     JC/MEDQ  D:  01/20/2006  T:  01/21/2006  Job:  [id number]

## 2011-03-12 NOTE — Op Note (Signed)
NAME:  Matthew Mays, Matthew Mays NO.:  [id number]   MEDICAL RECORD NO.:  [id number]          PATIENT TYPE:  INP   LOCATION:  1408                         FACILITY:  [facility]   PHYSICIAN:  Bertram Millard. 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   PRINCIPAL PROCEDURE:  Urethrectomy.   SURGEON:  Bertram Millard. Dahlstedt, M.D.   BRIEF HISTORY:  The patient is a 70 year old male who is undergoing  cystectomy for treatment of the significant/widespread transitional cell  carcinoma of the bladder.  During the cystectomy, a distal prostatic  urethral margin was positive for papillary transitional cell carcinoma.  Total urethrectomy is indicated.  The patient has been prepped and draped  for possible perineal procedure.   DESCRIPTION OF PROCEDURE:  A catheter was advanced through the patient's  urethra.  Scrotum was retracted anteriorly.  A midline perineal incision was  then made and carried down through the subcutaneous tissues with  electrocautery.  A [place] retractor was then placed to afford better  visualization of the urethra.  Urethra was identified just distal to the  bulbous urethra.  It was then grasped with the [facility].  Dissection was then  carried proximally and distally, circumscribing the urethra.  I was unable  to apply traction distally and proximally, thus totally dissecting the  urethra from the corporal bodies.  I then dissected proximally to the pelvic  diaphragm.  The [facility] retractor was used to gain deep exposure.  Small  bleeders were either electrocoagulated or clipped.  Dissection was then  carried up to the proximal urethra which was then dissected free from the  stump that was left.  A separate specimen, the most proximal urethra, was  sent as urethral margin for a permanent section.  Dr. Earlene Plater, from above,  then closed the pelvic floor with a Vicryl suture.  I then placed FloSeal in  the base of the incision to gain adequate hemostasis.  I then dissected the  urethra distally, inverting the penis.  Dissection was then carried up all  the way to the glands.  The distal urethra was free from the corpus  spongiosum of the glands.  In this manner, the entire urethra was sent as  urethra.  The penis was then allowed to get back to its resting position  outside the body.  Two sutures of 3-0 Monocryl were then placed in a  horizontal mattress fashion in the glandular defect to close it.  Hemostasis  was adequate.  The perineal incision was then closed in three layers.  The  inner two layers were of running, 2-0 Vicryl in simple fashion.  The skin  edges were reapproximated using a running, 4-0 Monocryl in a subcuticular  fashion.  The patient tolerated this segment of the procedure well.  Bertram Millard. Dahlstedt, M.D.  Electronically Signed     SMD/MEDQ  D:  01/21/2006  T:  01/24/2006  Job:  [id number]

## 2011-03-12 NOTE — Op Note (Signed)
NAME:  Matthew Mays, Matthew Mays NO.:  [id number]   MEDICAL RECORD NO.:  [id number]          PATIENT TYPE:  INP   LOCATION:  1432                         FACILITY:  [facility]   PHYSICIAN:  Ronald L. Earlene Plater, M.D.  DATE OF BIRTH:  April 11, 1941   DATE OF PROCEDURE:  01/21/2006  DATE OF DISCHARGE:                                 OPERATIVE REPORT   PREOPERATIVE DIAGNOSIS:  Bladder cancer.   POSTOPERATIVE DIAGNOSIS:  Bladder cancer and urothelial cancer.   OPERATION PERFORMED:  Radical cystectomy with bilateral lymph node  dissection and ileal conduit urinary diversion and urethrectomy.   SURGEON:  Lucrezia Starch. Earlene Plater, M.D. for cystectomy with bilateral lymph node  dissection and ileal conduit urinary diversion.   ASSISTANT:  1.  Bertram Millard. Retta Diones, M.D.  2.  Cornelious Bryant, MD   For the urethrectomy, surgeon is Dr. Retta Diones.   ANESTHESIA:  General.   INDICATIONS FOR PROCEDURE:  This is a 70 year old gentleman with a history  of bilateral hydronephrosis that is due to low grade [facility] affecting a big  portion of the bladder mucosa.  After extensive counseling, the patient  elected for a radical cystectomy with ileal conduit diversion.  The patient  is known to have renal failure secondary to his hydronephrosis and he is  dialysis dependent.   DESCRIPTION OF PROCEDURE:  The patient was admitted to the hospital the day  before surgery.  Nephrology and the hospital services were consulted. After  a bowel prep, the patient was taken the next day to operating room where  general anesthesia was induced.  Preop antibiotics were given.  He was  placed in the dorsal lithotomy position.  His perineal and abdominal area  were prepped and draped in the normal sterile fashion.  Time out was taken  to properly identify the patient and the procedure to be done.  A low  midline incision was made below the umbilicus.  Dissection was carried out  through Scarpa's fascia and anterior rectus fascia  through the rectus  muscles.  The peritoneal cavity was then entered.  Inspection of the  peritoneal cavity did not reveal any gross abdominal seeding that could be  detected.  The bowels were reflected anteriorly.  The vas deferens initially  on the right side was identified and clipped and ligated.  Theposteriorreflection of the peritoneum was then incised above the right  ureter which was in close proximity to the pelvic artery and vein which  facilitated__________.  The ureter on the right side was noted to be  excessively dilated due to the patient's known bilateral ureteral  hydronephrosis.  The posterior peritoneal covering was then incised and then  the right ureter was freed up proximally.  The right ureter was then traced  distally to the level of the bladder and was clipped and ligated  posteriorly.  The distal margin of the right ureter was then sent for frozen  section for identification of possible tumor involvement.  Again  identification of the left ureter was then made after clipping and cutting  the left vas deferens.  The  left ureter was then freed proximally and  distally to the level of the bladder.  The distal portion of the left ureter  was clipped and cut.  A portion of the distal left ureter was then sent for  the frozen section for possible involvement of bladder tumor.  The left  ureter was also noted to be grossly hydronephrotic.  Decision at that point  was made to continue with the cystectomy and to postpone lymph node  dissection until the bladder was out.  The bladder pedicles were then taken  down using the LigaSure, starting from the base of the bladder posteriorly  and gradually moving anterior to the apex of the prostate.  This was done on  the right and left side sequentially.  After the posterior aspect of the  bladder was freed up, dissection was then started anteriorly at the level of  the apex of the bladder.  The dorsal venous complex was initially  ligated  and cut.  Due to a considerable amount of oozing through the dorsal venous  complex, the catheter that was inserted the beginning of the case through  the penis was taken out and the LigaSure was applied across the urethra and  the venous complex and thus the urethra was divided that way.  This  effectively freed the surgical specimen which included the bladder and the  prostate.  This was sent for permanent pathological specimen with a portion  of the anterior prostatic urethra was sent for frozen section for immediate  identification of possible tumor involvement.  At this point, lymph node  dissection was done on the right and left side sequentially.  During this  process, the pathologist called intraoperatively, called into the operating  room and relayed the message that both distal ureteral samples were negative  for tumor involvement, however, the prostatic urethra was involved with  tumor.  Decision at that point was to proceed with urethrectomy which was  performed by Dr. Retta Diones, whose note will be dictated separately.  So the  lymph node dissection on the right side was started by freeing the lymph  node just above the right iliac artery and dissection of the lymph node  carried down to the level below the right iliac vein and was taken laterally  through to the pelvic wall on the right side.  The dissection was carried  anterior to the level of the obturator nerve.  Distally, the dissection was  taken to the node ofwhere the iliac artery and vein go into the femoral  canal.  It was noted that during the lymph node dissection the right iliac  vein was bleeding secondary to a small puncture hole that was fixed with 4-0  Prolene uneventfully.  The right lymph node packet was then sent to  permanent pathological evaluation.  The same lymph node dissection was then  carried out on the left side.  At this point the hemostasis was adequately in place in the pelvis using  electrocautery and Surgicel.  Attention was  then given to creating the ileal conduit.  About 15 cm from the ileocecal  junction, a 15 cm piece of ileum was identified.  This piece of ileum was  then cut using staples.  The ileal continuity was then re-established again  using staples.  The proximal portion of the ileum was then used to  anastomose the ureters.  Prior to anastomosing the ureters the left ureter  was tunneled behind the bowel mesentery to the right  side.  Both the  ureteral walls were tagged to the ileal loop wall a few centimeters proximal  to the end of both ureters to aid in the anastomosis of the ureters.  A  small circular piece of serosa and mucosal stuck into the ileum close to the  left distal ureteral opening.  The ureter and the newly made ileal defect  was then anastomosed together using 4-0 chromic in a running fashion.  Prior  to complete closure of the suture line, a single J stent was inserted into  the left kidney.  The distal portion of the ileal conduit where the staple  line was done was then cut  and the distal portion of the stent was  externalized for that portion.  Again, the same anastomosis was then carried  out on the right side with insertion of a single J stent in the right  ureter.  The distal end of the ureteral stent was externalized through the  distal portion of the ileal conduit.  The marking on the right upper  quadrant done by the ostomy nurses the night before was then used to create  a circular skin defect where the ostomy would finally exit.  Dissection was  then carried down to the level of the fascia where the cruciate incision was  made in the anterior rectus fascia.  The ileal conduit was then tunneled  through the fascial defect.  2-0 Vicryl sutures were then used to attach the  ileal conduit seromuscular to the anterior rectus fascia in order to keep  the ileal conduit in position.  Care was taken not to pull the ureteral  stents  during the maneuver of tunneling the ileal conduit through the ostomy  wall defect.  The ileal mucosa was then everted at the level of the skin  using 3-0 chromic.  The ostomy was then matured using multiple interrupted  chromic sutures.  At this point hemostasis was then inspected in the pelvis  with no evidence of active bleeding.  The abdominal cavity was then  copiously irrigated with antibiotic solution.  Dr. Retta Diones at this point  finished his urethrectomy with no complications. The abdominal wound was  then closed using 1-0 PDS.  The skin was then closed using staples.  An  ostomy back appliance was applied.  Prior to closure of the wound, Al Pimple drain was inserted in the left lower quadrant.  The Jackson-Pratt  drain was put in the dependent portion of the pelvic cavity.  The Al Pimple pelvic drain was sutured in place using silk.  An ostomy appliance was  applied to the newly made ileal  conduit.  At end of the procedure, the ileal conduit mucosa looked viable and healthy.  The wound was dressed  appropriately and the patient was taken out of the supine position and  awakened from anesthesia with no complication.   DISPOSITION:  The patient was taken to post anesthesia care unit in stable  condition.  Please note that Dr. Earlene Plater and Dr. Retta Diones both were present  and participated in the entire procedure as they were the primary surgeons.     ______________________________  Terie Purser, MD      Lucrezia Starch. Earlene Plater, M.D.  Electronically Signed    JH/MEDQ  D:  01/24/2006  T:  01/25/2006  Job:  [id number]

## 2011-03-12 NOTE — Discharge Summary (Signed)
NAME:  Matthew Mays, Matthew Mays NO.:  [id number]   MEDICAL RECORD NO.:  [id number]          PATIENT TYPE:  INP   LOCATION:  1419                         FACILITY:  [facility]   PHYSICIAN:  Lucrezia Starch. Earlene Plater, M.D.  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   PROCEDURES:  Cystoprostatectomy with urethrectomy with ileal conduit  diversion done on January 21, 2006.   DISCHARGE DIAGNOSES AND COMORBIDITIES:  1.  Bladder cancer.  2.  Chronic renal disease.  3.  Acidosis.  4.  Gastroesophageal reflux.  5.  Anemia.  6.  Hypertension.  7.  Secondary hypothyroidism.   HISTORY OF PRESENT ILLNESS AND HOSPITAL COURSE:  Matthew Mays is a 70 year old  gentleman who was seen and evaluated on January 02, 2005 when he was admitted  to [facility] with acute on chronic renal failure.  Work-up of his renal  failure to rule out bilateral hydronephrosis with calcified mass in the  region of the bladder and possibly the prostate.  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  He was then thought to have  diffuse bladder cancer causing bilateral ureteral obstruction with  subsequent renal failure.  After extensive counseling the patient elected  for cystectomy and ileal conduit diversion.  He was also consented for the  possibility of a urethrectomy should his urethral margins be positive for  cancer.  Patient was admitted on January 20, 2006 where he was preoperative  and received a bowel prep the day before surgery.  He was also seen by the  nephrology services preoperatively.  His creatinine on admission was noted  to be 6.  The patient underwent the second day cystoprostatectomy and  intraoperatively  his urethral margin was noted positive for cancer for which  he underwent a urethrectomy.  The patient had a successful procedure  __________.  He received bilateral lymph node dissection and an ileal  conduit during that procedure.  Postoperatively he was observed in the ICU  for the immediate postoperative period after which the patient was  transferred to the floor.  His postoperative course was relatively smooth  and was eventful for two minor incidents of diarrhea and some wound redness.  The diarrhea was investigated for Clostridium difficile and was negative.  The patient's wound redness was thought to be minor wound infection with no  evidence for dehiscence for which the patient was started on Keflex.  His  creatinine during the entire hospitalization slowly trended down and the  patient was discharged with a creatinine 5 on January 30, 2006.  It should be  noted that the patient's pain was very well controlled throughout his  hospital stay.  Once his bowels started moving he was given a renal diet  which he tolerated very well.  On the day of  discharge the patient was  afebrile.  His vital signs were stable.  He was awake, alert, and oriented.  His abdomen was soft and nontender.  His ostium was pink and viable.  He had  clear breath sounds bilaterally.  His wounds were clean, dry, and intact  with some area of redness on his wound that was given Keflex.  The patient  was discharged to home on his regular home medications as documented on the  medication reconciliation sheet.  The patient was given prescription of  Keflex  and Vicodin to be taken.  Should the patient have any questions or concerns  he is to contact us or come to the emergency room.  The patient was given a  follow-up with Dr. Earlene Plater in two to three weeks for postoperative follow-up.     ______________________________  Terie Purser, MD      Lucrezia Starch. Earlene Plater, M.D.  Electronically Signed    JH/MEDQ  D:   02/15/2006  T:  02/16/2006  Job:  [id number]

## 2011-03-12 NOTE — Consult Note (Signed)
NAME:  Matthew Mays, Matthew Mays NO.:  [id number]   MEDICAL RECORD NO.:  [id number]          PATIENT TYPE:  INP   LOCATION:  2103                         FACILITY:  [facility]   PHYSICIAN:  Lucrezia Starch. Earlene Plater, M.D.  DATE OF BIRTH:  [birth year]   DATE OF CONSULTATION:  01/04/2006  DATE OF DISCHARGE:                                   CONSULTATION   A GU consult for obstructive uropathy with bilateral hydronephrosis.  Admitted for respiratory distress with ventilation for respiratory fatigue,  severe anemia, increased BUN and creatinine with uremia and azotemia and  life threatening hyperkalemia.  Four days prior to admission, patient  experienced weakness, shortness of breath, dyspnea on exertion and  orthopnea.  He reported melena, hematochezia for two to three years.  Urination on admission:  Diapers were in place.  States he has been  incontinent for the last one to two years.  On interview today, patient  states he urinated normally prior to hospitalization with good flow,  nocturia x2, no pain or blood.   Upon entering unit, RN is currently placing three-way Foley for increased  clots and sludge and mild hematuria and to have continual irrigation with  normal saline with order being taken from renal service.   PAST MEDICAL HISTORY:  1.  History of delirium tremors secondary to ETOH withdrawal.  2.  History of dyspepsia.  3.  Cataracts.   PAST SURGICAL HISTORY:  None.   MEDICATIONS PRIOR TO ADMISSION:  1.  Eyes for cataracts.  2.  Naprosyn p.r.n.   FAMILY HISTORY:  No history of prostate cancer or BPH.   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   REVIEW OF SYSTEMS:  Cough with sputum, mild dyspepsia and epigastric pain.  No chest pain.  Neurologically, alert and oriented, however, poor historian  on continence status.   PHYSICAL EXAMINATION:  GENERAL APPEARANCE:  A 70 year old white male in no  acute distress.  VITAL SIGNS:  Temperature 98.1, blood pressure 118/55, pulse 105, O2  saturation 97% on two liters.  HEENT:  Central lines at right neck.  ABDOMEN:  Soft, nondistended.  GU:  Testicles descended bilaterally.  Prostate large, semisymmetric,  benign.  Three-way Foley in place.  Tea-colored urine.  SKIN:  Warm and dry.  EXTREMITIES:  There is 1+ edema to lower extremities and hands.  Positive  pedal pulses.  NEUROLOGIC:  Alert and oriented x3, although poor health historian.   LABORATORY DATA:  White count 97.3, hemoglobin 9.3, hematocrit 26.8,  platelets 241.  Sodium 143, potassium 4, chloride 113, CO2 19, BUN 66,  creatinine 6.4, sodium 92, calcium 6.9.  Negative urine culture on January 02, 2006.   Renal ultrasound from January 02, 2006, severe bilateral hydronephrosis with  calcified mass in region of bladder which may represent prostate.   IMPRESSION AND PLAN:  1.  Obstructed uropathy.  CT scan abdomen and pelvis without contrast on      Thursday.  Keep bladder drained.  Discontinue bladder irrigation,      replace 18 [place] three-way with 22 [place] two-way and may hand irrigate      if needed.  2.  Renal failure.  3.  Anemia.  4.  Hypocalcemia, as per medical plan.     ______________________________  Alessandra Bevels. Chase Picket, FNP-C      Ronald L. Earlene Plater, M.D.  Electronically Signed    JML/MEDQ  D:  01/04/2006  T:  01/06/2006  Job:  [id number]

## 2011-03-12 NOTE — H&P (Signed)
NAME:  Matthew Mays, Matthew Mays NO.:  [id number]   MEDICAL RECORD NO.:  [id number]          PATIENT TYPE:  INP   LOCATION:  1432                         FACILITY:  [facility]   PHYSICIAN:  Ronald L. Earlene Plater, M.D.  DATE OF BIRTH:  [birth year]   DATE OF ADMISSION:  DATE OF DISCHARGE:                                HISTORY & PHYSICAL   REASON FOR ADMISSION:  Surgery for bladder cancer.   HISTORY OF PRESENT ILLNESS:  The patient was previously hospitalized, found  incidentally on CT scan to have obstructive uropathy with thickened bladder  wall and calcifications to bladder wall.  A cysto performed with biopsy that  revealed __________  of bladder.  The patient also had renal failure due to  obstructive uropathy and did require dialysis.   PAST MEDICAL HISTORY:  1.  Left A-V graft placed in the left arm in April 2007.  2.  Cystourethroscopy, performed January 10, 2006.  3.  Previous history of DTs secondary to alcohol withdrawal two years ago.  4.  GERD.   ALLERGIES:  None.   SOCIAL HISTORY:  Alcohol use till 2005.  He has a 60-year pack history for  tobacco use.   PHYSICAL EXAMINATION:  GENERAL:  A thin well-appearing male in no acute  distress.  HEENT:  Normocephalic.  NECK:  Without thyromegaly or JVD.  CHEST:  A few rales at bases.  ABDOMEN:  Soft, nontender.  GU:  Foley catheter in place.   IMPRESSION:  Known papillary urothelial cancer of bladder.   PLAN:  The patient is admitted to undergo a cystectomy ileoconduit by Dr.  Darvin Neighbours.     ______________________________  Alessandra Bevels. Chase Picket, FNP-C      Ronald L. Earlene Plater, M.D.  Electronically Signed   JML/MEDQ  D:  03/02/2006  T:  03/02/2006  Job:  [id number]

## 2011-07-23 LAB — POCT I-STAT 4, (NA,K, GLUC, HGB,HCT)
Glucose, Bld: 165 — ABNORMAL HIGH
HCT: 32 — ABNORMAL LOW
Hemoglobin: 10.9 — ABNORMAL LOW
Operator id: 206361
Potassium: 4.5
Sodium: 137
Sodium: 139

## 2011-07-23 LAB — HEMOGLOBIN AND HEMATOCRIT, BLOOD: Hemoglobin: 10.5 — ABNORMAL LOW

## 2011-07-26 LAB — HEMOGLOBIN AND HEMATOCRIT, BLOOD
HCT: 37.1 — ABNORMAL LOW
Hemoglobin: 12.2 — ABNORMAL LOW

## 2011-07-27 LAB — RENAL FUNCTION PANEL
Albumin: 3.3 g/dL — ABNORMAL LOW (ref 3.5–5.2)
BUN: 60 mg/dL — ABNORMAL HIGH (ref 6–23)
CO2: 27 mEq/L (ref 19–32)
Chloride: 98 mEq/L (ref 96–112)
Creatinine, Ser: 6.1 mg/dL — ABNORMAL HIGH (ref 0.4–1.5)
Glucose, Bld: 217 mg/dL — ABNORMAL HIGH (ref 70–99)

## 2011-07-27 LAB — CBC
MCHC: 33.1 g/dL (ref 30.0–36.0)
RBC: 4.02 MIL/uL — ABNORMAL LOW (ref 4.22–5.81)

## 2011-07-27 LAB — FERRITIN: Ferritin: 265 ng/mL (ref 22–322)

## 2011-07-27 LAB — IRON AND TIBC
Iron: 53 ug/dL (ref 42–135)
TIBC: 261 ug/dL (ref 215–435)

## 2011-07-28 LAB — POCT HEMOGLOBIN-HEMACUE: Hemoglobin: 11.2 — ABNORMAL LOW

## 2011-07-30 LAB — IRON AND TIBC
Iron: 76 ug/dL (ref 42–135)
Saturation Ratios: 29 % (ref 20–55)
TIBC: 266 ug/dL (ref 215–435)

## 2011-07-30 LAB — RENAL FUNCTION PANEL
BUN: 63 mg/dL — ABNORMAL HIGH (ref 6–23)
CO2: 27 mEq/L (ref 19–32)
Calcium: 9.6 mg/dL (ref 8.4–10.5)
Chloride: 99 mEq/L (ref 96–112)
Creatinine, Ser: 6.9 mg/dL — ABNORMAL HIGH (ref 0.4–1.5)
GFR calc Af Amer: 10 mL/min — ABNORMAL LOW (ref >60)
GFR calc non Af Amer: 8 mL/min — ABNORMAL LOW (ref >60)
Glucose, Bld: 220 mg/dL — ABNORMAL HIGH (ref 70–99)

## 2011-11-04 DIAGNOSIS — N185 Chronic kidney disease, stage 5: Secondary | ICD-10-CM | POA: Diagnosis not present

## 2011-11-04 DIAGNOSIS — E119 Type 2 diabetes mellitus without complications: Secondary | ICD-10-CM | POA: Diagnosis not present

## 2011-11-04 DIAGNOSIS — Z23 Encounter for immunization: Secondary | ICD-10-CM | POA: Diagnosis not present

## 2011-11-04 DIAGNOSIS — D649 Anemia, unspecified: Secondary | ICD-10-CM | POA: Diagnosis not present

## 2011-11-04 DIAGNOSIS — N2581 Secondary hyperparathyroidism of renal origin: Secondary | ICD-10-CM | POA: Diagnosis not present

## 2012-02-24 DIAGNOSIS — Z961 Presence of intraocular lens: Secondary | ICD-10-CM | POA: Diagnosis not present

## 2012-02-24 DIAGNOSIS — H40059 Ocular hypertension, unspecified eye: Secondary | ICD-10-CM | POA: Diagnosis not present

## 2012-02-24 DIAGNOSIS — H0230 Blepharochalasis unspecified eye, unspecified eyelid: Secondary | ICD-10-CM | POA: Diagnosis not present

## 2012-03-01 DIAGNOSIS — D509 Iron deficiency anemia, unspecified: Secondary | ICD-10-CM | POA: Diagnosis not present

## 2012-03-01 DIAGNOSIS — N185 Chronic kidney disease, stage 5: Secondary | ICD-10-CM | POA: Diagnosis not present

## 2012-03-01 DIAGNOSIS — R7309 Other abnormal glucose: Secondary | ICD-10-CM | POA: Diagnosis not present

## 2012-03-01 DIAGNOSIS — E739 Lactose intolerance, unspecified: Secondary | ICD-10-CM | POA: Diagnosis not present

## 2012-03-06 ENCOUNTER — Other Ambulatory Visit: Payer: Self-pay

## 2012-03-06 DIAGNOSIS — Z01810 Encounter for preprocedural cardiovascular examination: Secondary | ICD-10-CM

## 2012-03-06 DIAGNOSIS — N185 Chronic kidney disease, stage 5: Secondary | ICD-10-CM

## 2012-03-09 ENCOUNTER — Encounter: Payer: Self-pay | Admitting: Vascular Surgery

## 2012-03-21 ENCOUNTER — Encounter: Payer: Self-pay | Admitting: Vascular Surgery

## 2012-03-22 ENCOUNTER — Ambulatory Visit (INDEPENDENT_AMBULATORY_CARE_PROVIDER_SITE_OTHER): Payer: Medicare Other | Admitting: Vascular Surgery

## 2012-03-22 ENCOUNTER — Other Ambulatory Visit: Payer: Self-pay

## 2012-03-22 ENCOUNTER — Encounter: Payer: Self-pay | Admitting: Vascular Surgery

## 2012-03-22 VITALS — BP 176/78 | HR 103 | Resp 20 | Ht 70.0 in | Wt 187.0 lb

## 2012-03-22 DIAGNOSIS — Z01810 Encounter for preprocedural cardiovascular examination: Secondary | ICD-10-CM | POA: Diagnosis not present

## 2012-03-22 DIAGNOSIS — N185 Chronic kidney disease, stage 5: Secondary | ICD-10-CM | POA: Diagnosis not present

## 2012-03-22 DIAGNOSIS — N186 End stage renal disease: Secondary | ICD-10-CM

## 2012-03-22 NOTE — Progress Notes (Signed)
[facility]  Patient name: Matthew Mays MRN: [id number] DOB: 12/22/1940 Sex: male  REASON FOR CONSULT: evaluate for hemodialysis access. Referred by Dr. Alvan Powell.  HPI: Matthew Mays is a 71 y.o. male who had a left upper arm AV graft placed many years ago that was never used. He was seen by Dr. Alvan Pallin the office it was noted this graft was occluded. Was not clear for how long. He sent for evaluation for new access. Of note his had no recent uremic symptoms. Specifically he denies nausea, vomiting, palpitations, fatigue, or anorexia.  He has a history of hypertension which is been well controlled on his current medications. He's had previous sinus tachycardia but has had no recent episodes.   Past Medical History  Diagnosis Date  . Bladder cancer   . Chronic kidney disease   . Secondary hyperparathyroidism   . H/O sinus tachycardia   . Hypertension   . Prediabetes     Family History  Problem Relation Age of Onset  . Cancer Mother     BRAIN    SOCIAL HISTORY: History  Substance Use Topics  . Smoking status: Former Smoker -- 2.0 packs/day for 40 years    Types: Cigarettes    Quit date: 03/22/2006  . Smokeless tobacco: Never Used  . Alcohol Use: Yes    No Known Allergies  Current Outpatient Prescriptions  Medication Sig Dispense Refill  . calcitRIOL (ROCALTROL) 0.5 MCG capsule Take 0.5 mcg by mouth daily.      . calcium carbonate (TUMS EX) 750 MG chewable tablet Chew 4 tablets by mouth.      . dorzolamide (TRUSOPT) 2 % ophthalmic solution Place 2 drops into the right eye daily.      . furosemide (LASIX) 40 MG tablet Take 40 mg by mouth daily.      . Iron 66 MG TABS Take 65 mg by mouth daily.      . metoprolol (LOPRESSOR) 50 MG tablet Take 50 mg by mouth 2 (two) times daily.      . sodium bicarbonate 650 MG tablet Take 650 mg by mouth 4 (four) times daily.        REVIEW OF SYSTEMS: [X ] denotes positive finding; [  ] denotes negative  finding  CARDIOVASCULAR:  [ ] chest pain   [ ] chest pressure   [ ] palpitations   [ ] orthopnea   [ ] dyspnea on exertion   [ ] claudication   [ ] rest pain   [ ] DVT   [ ] phlebitis PULMONARY:   [ ] productive cough   [ ] asthma   [ ] wheezing NEUROLOGIC:   [ ] weakness  [ ] paresthesias  [ ] aphasia  [ ] amaurosis  [ ] dizziness HEMATOLOGIC:   [ ] bleeding problems   [ ] clotting disorders MUSCULOSKELETAL:  [ ] joint pain   [ ] joint swelling [ ] leg swelling GASTROINTESTINAL: [ ]  blood in stool  [ ]  hematemesis GENITOURINARY:  [ ]  dysuria  [ ]  hematuria PSYCHIATRIC:  [ ] history of major depression INTEGUMENTARY:  [ ] rashes  [ ] ulcers CONSTITUTIONAL:  [ ] fever   [ ] chills  PHYSICAL EXAM: Filed Vitals:   03/22/12 1508  BP: 176/78  Pulse: 103  Resp: 20  Height: 5' 10" (1.778 m)  Weight: 187 lb (84.823 kg)   Body mass index is 26.83 kg/(m^2). GENERAL:   The patient is a well-nourished male, in no acute distress. The vital signs are documented above. CARDIOVASCULAR: There is a regular rate and rhythm without significant murmur appreciated. He has palpable radial pulses bilaterally. PULMONARY: There is good air exchange bilaterally without wheezing or rales. ABDOMEN: Soft and non-tender with normal pitched bowel sounds.  MUSCULOSKELETAL: There are no major deformities or cyanosis. NEUROLOGIC: No focal weakness or paresthesias are detected. SKIN: There are no ulcers or rashes noted. PSYCHIATRIC: The patient has a normal affect.  DATA:  I have independently interpreted his vein mapping. This shows that his upper arm graft in the left is occluded. The vein in the forearm measures 0.3-0.38 cm in diameter. The upper arm cephalic vein measures 0.3 8.42 cm in maximum diameter. The basilic vein is at very short in the upper arm.  I have reviewed his records from Dr. Alvin Palos office. He has stage V chronic kidney disease. His had a previous prior cystectomy for cancer the bladder.  Also has secondary hyperparathyroidism which has been stable.  MEDICAL ISSUES: I've recommended that we explore his forearm cephalic vein on the left and potentially place a radiocephalic fistula. The second option for a fistula would be an upper arm fistula. If neither were adequate I do not think the basilic vein would be usable so we would likely have to place a new graft in the left arm. I have explained the indications for placement of an AV fistula or AV graft. I've explained that if at all possible we will place an AV fistula.  I have reviewed the risks of placement of an AV fistula including but not limited to: failure of the fistula to mature, need for subsequent interventions, and thrombosis. In addition I have reviewed the potential complications of placement of an AV graft. These risks include, but are not limited to, graft thrombosis, graft infection, wound healing problems, bleeding, arm swelling, and steal syndrome. All the patient's questions were answered and they are agreeable to proceed with surgery. His surgery has been scheduled for 03/30/2012.   Orville Widmann S [facility] [place] Beeper: [phone]   

## 2012-03-22 NOTE — Progress Notes (Signed)
Bilateral UE venous duplex mapping for AVF performed @ [facility] 03/22/2012

## 2012-03-23 ENCOUNTER — Encounter (HOSPITAL_COMMUNITY): Payer: Self-pay | Admitting: Pharmacist

## 2012-03-23 ENCOUNTER — Encounter (HOSPITAL_COMMUNITY): Payer: Self-pay | Admitting: Surgery

## 2012-03-23 NOTE — Progress Notes (Signed)
Called pt to obtain PAT assesment.  Denies any cardiac and/or sleep studies.  Denies sleep apnea.  Pt stated PCP: Dr. Tenny Craw with [facility](#[phone]). Confirmed w/pt lab appt date/time.//L. Rubens Cranston,RN

## 2012-03-24 ENCOUNTER — Encounter (HOSPITAL_COMMUNITY)
Admission: RE | Admit: 2012-03-24 | Discharge: 2012-03-24 | Disposition: A | Discharge status: Home / Self Care | Payer: Medicare Other | Source: Ambulatory Visit | Attending: Vascular Surgery | Admitting: Vascular Surgery

## 2012-03-24 ENCOUNTER — Encounter (HOSPITAL_COMMUNITY): Payer: Self-pay

## 2012-03-24 DIAGNOSIS — N189 Chronic kidney disease, unspecified: Secondary | ICD-10-CM | POA: Diagnosis not present

## 2012-03-24 DIAGNOSIS — I129 Hypertensive chronic kidney disease with stage 1 through stage 4 chronic kidney disease, or unspecified chronic kidney disease: Secondary | ICD-10-CM | POA: Diagnosis not present

## 2012-03-24 DIAGNOSIS — Z01810 Encounter for preprocedural cardiovascular examination: Secondary | ICD-10-CM | POA: Diagnosis not present

## 2012-03-24 DIAGNOSIS — Z01818 Encounter for other preprocedural examination: Secondary | ICD-10-CM | POA: Diagnosis not present

## 2012-03-24 DIAGNOSIS — N2581 Secondary hyperparathyroidism of renal origin: Secondary | ICD-10-CM | POA: Diagnosis not present

## 2012-03-24 DIAGNOSIS — Z01812 Encounter for preprocedural laboratory examination: Secondary | ICD-10-CM | POA: Diagnosis not present

## 2012-03-24 DIAGNOSIS — Z01811 Encounter for preprocedural respiratory examination: Secondary | ICD-10-CM | POA: Diagnosis not present

## 2012-03-24 DIAGNOSIS — R7309 Other abnormal glucose: Secondary | ICD-10-CM | POA: Diagnosis not present

## 2012-03-24 LAB — SURGICAL PCR SCREEN: Staphylococcus aureus: NEGATIVE

## 2012-03-24 NOTE — Pre-Procedure Instructions (Signed)
20 Earl Losee Marcom  03/24/2012   Your procedure is scheduled on:  03/30/12  Report to [facility] at 8:30 AM. -900 per dr  Call this number if you have problems the morning of surgery: [phone]   Remember:   Do not eat food:After Midnight.  May have clear liquids: up to 4 Hours before arrival (4:30 AM).  Clear liquids include soda, tea, black coffee, apple or grape juice, broth.  Take these medicines the morning of surgery with A SIP OF WATER: eye drops, Metoprolol/Lopressor   Do not wear jewelry, make-up or nail polish.  Do not wear lotions, powders, or perfumes. You may wear deodorant.  Do not shave 48 hours prior to surgery. Men may shave face and neck.  Do not bring valuables to the hospital.  Contacts, dentures or bridgework may not be worn into surgery.  Leave suitcase in the car. After surgery it may be brought to your room.  For patients admitted to the hospital, checkout time is 11:00 AM the day of discharge.   Patients discharged the day of surgery will not be allowed to drive home. Wife linda     Special Instructions: CHG Shower Use Special Wash: 1/2 bottle night before surgery and 1/2 bottle morning of surgery.   Please read over the following fact sheets that you were given: Pain Booklet, Coughing and Deep Breathing, MRSA Information and Surgical Site Infection Prevention

## 2012-03-24 NOTE — Pre-Procedure Instructions (Signed)
20 Arn Mcomber Herberg  03/24/2012   Your procedure is scheduled on:  03/30/12  Report to [facility] at 8:30 AM.  Call this number if you have problems the morning of surgery: [phone]   Remember:   Do not eat food:After Midnight.  May have clear liquids: up to 4 Hours before arrival (4:30 AM).  Clear liquids include soda, tea, black coffee, apple or grape juice, broth.  Take these medicines the morning of surgery with A SIP OF WATER: eye drops, Metoprolol/Lopressor   Do not wear jewelry, make-up or nail polish.  Do not wear lotions, powders, or perfumes. You may wear deodorant.  Do not shave 48 hours prior to surgery. Men may shave face and neck.  Do not bring valuables to the hospital.  Contacts, dentures or bridgework may not be worn into surgery.  Leave suitcase in the car. After surgery it may be brought to your room.  For patients admitted to the hospital, checkout time is 11:00 AM the day of discharge.   Patients discharged the day of surgery will not be allowed to drive home.     Special Instructions: CHG Shower Use Special Wash: 1/2 bottle night before surgery and 1/2 bottle morning of surgery.   Please read over the following fact sheets that you were given: Pain Booklet, Coughing and Deep Breathing, MRSA Information and Surgical Site Infection Prevention

## 2012-03-29 MED ORDER — CEFAZOLIN SODIUM-DEXTROSE 2-3 GM-% IV SOLR
2.0000 g | INTRAVENOUS | Status: AC
  Administered 2012-03-30: 2 g via INTRAVENOUS
  Filled 2012-03-29: qty 50

## 2012-03-30 ENCOUNTER — Encounter (HOSPITAL_COMMUNITY): Payer: Self-pay | Admitting: Anesthesiology

## 2012-03-30 ENCOUNTER — Encounter (HOSPITAL_COMMUNITY)
Admission: RE | Disposition: A | Discharge status: Home / Self Care | Payer: Self-pay | Source: Ambulatory Visit | Attending: Vascular Surgery

## 2012-03-30 ENCOUNTER — Encounter (HOSPITAL_COMMUNITY): Payer: Self-pay

## 2012-03-30 ENCOUNTER — Ambulatory Visit (HOSPITAL_COMMUNITY)
Admission: RE | Admit: 2012-03-30 | Discharge: 2012-03-30 | Disposition: A | Discharge status: Home / Self Care | Payer: Medicare Other | Source: Ambulatory Visit | Attending: Vascular Surgery | Admitting: Vascular Surgery

## 2012-03-30 ENCOUNTER — Ambulatory Visit (HOSPITAL_COMMUNITY): Payer: Medicare Other | Admitting: Anesthesiology

## 2012-03-30 ENCOUNTER — Telehealth: Payer: Self-pay | Admitting: Vascular Surgery

## 2012-03-30 DIAGNOSIS — Z01818 Encounter for other preprocedural examination: Secondary | ICD-10-CM | POA: Insufficient documentation

## 2012-03-30 DIAGNOSIS — N2581 Secondary hyperparathyroidism of renal origin: Secondary | ICD-10-CM | POA: Diagnosis not present

## 2012-03-30 DIAGNOSIS — I129 Hypertensive chronic kidney disease with stage 1 through stage 4 chronic kidney disease, or unspecified chronic kidney disease: Secondary | ICD-10-CM | POA: Insufficient documentation

## 2012-03-30 DIAGNOSIS — N186 End stage renal disease: Secondary | ICD-10-CM

## 2012-03-30 DIAGNOSIS — Z01812 Encounter for preprocedural laboratory examination: Secondary | ICD-10-CM | POA: Insufficient documentation

## 2012-03-30 DIAGNOSIS — N189 Chronic kidney disease, unspecified: Secondary | ICD-10-CM | POA: Insufficient documentation

## 2012-03-30 DIAGNOSIS — R7309 Other abnormal glucose: Secondary | ICD-10-CM | POA: Insufficient documentation

## 2012-03-30 DIAGNOSIS — Z01810 Encounter for preprocedural cardiovascular examination: Secondary | ICD-10-CM | POA: Insufficient documentation

## 2012-03-30 DIAGNOSIS — I10 Essential (primary) hypertension: Secondary | ICD-10-CM | POA: Diagnosis not present

## 2012-03-30 DIAGNOSIS — E211 Secondary hyperparathyroidism, not elsewhere classified: Secondary | ICD-10-CM | POA: Diagnosis not present

## 2012-03-30 DIAGNOSIS — C679 Malignant neoplasm of bladder, unspecified: Secondary | ICD-10-CM | POA: Diagnosis not present

## 2012-03-30 HISTORY — PX: AV FISTULA PLACEMENT: SHX1204

## 2012-03-30 SURGERY — ARTERIOVENOUS (AV) FISTULA CREATION
Anesthesia: Monitor Anesthesia Care | Site: Arm Lower | Laterality: Left | Wound class: Clean

## 2012-03-30 MED ORDER — PROPOFOL 10 MG/ML IV EMUL
INTRAVENOUS | Status: DC | PRN
  Administered 2012-03-30: 50 ug/kg/min via INTRAVENOUS

## 2012-03-30 MED ORDER — HYDROMORPHONE HCL PF 1 MG/ML IJ SOLN: 0.2500 mg | INTRAMUSCULAR | Status: DC | PRN

## 2012-03-30 MED ORDER — HEPARIN SODIUM (PORCINE) 1000 UNIT/ML IJ SOLN
INTRAMUSCULAR | Status: DC | PRN
  Administered 2012-03-30: 6000 [IU] via INTRAVENOUS

## 2012-03-30 MED ORDER — OXYCODONE-ACETAMINOPHEN 5-325 MG PO TABS: 1.0000 | ORAL_TABLET | ORAL | Status: AC | PRN

## 2012-03-30 MED ORDER — LIDOCAINE HCL (CARDIAC) 20 MG/ML IV SOLN
INTRAVENOUS | Status: DC | PRN
  Administered 2012-03-30: 40 mg via INTRAVENOUS

## 2012-03-30 MED ORDER — PROPOFOL 10 MG/ML IV EMUL
INTRAVENOUS | Status: DC | PRN
  Administered 2012-03-30 (×2): 30 mg via INTRAVENOUS

## 2012-03-30 MED ORDER — SODIUM CHLORIDE 0.9 % IR SOLN
Status: DC | PRN
  Administered 2012-03-30: 14:00:00

## 2012-03-30 MED ORDER — 0.9 % SODIUM CHLORIDE (POUR BTL) OPTIME
TOPICAL | Status: DC | PRN
  Administered 2012-03-30: 1000 mL

## 2012-03-30 MED ORDER — SODIUM CHLORIDE 0.9 % IV SOLN
INTRAVENOUS | Status: DC
  Administered 2012-03-30: 20 mL/h via INTRAVENOUS

## 2012-03-30 MED ORDER — ONDANSETRON HCL 4 MG/2ML IJ SOLN: 4.0000 mg | Freq: Once | INTRAMUSCULAR | Status: DC | PRN

## 2012-03-30 MED ORDER — PROTAMINE SULFATE 10 MG/ML IV SOLN
INTRAVENOUS | Status: DC | PRN
  Administered 2012-03-30: 5 mg via INTRAVENOUS
  Administered 2012-03-30: 10 mg via INTRAVENOUS
  Administered 2012-03-30: 5 mg via INTRAVENOUS
  Administered 2012-03-30: 10 mg via INTRAVENOUS

## 2012-03-30 MED ORDER — FENTANYL CITRATE 0.05 MG/ML IJ SOLN
INTRAMUSCULAR | Status: DC | PRN
  Administered 2012-03-30: 100 ug via INTRAVENOUS

## 2012-03-30 MED ORDER — LIDOCAINE HCL (PF) 1 % IJ SOLN
INTRAMUSCULAR | Status: DC | PRN
  Administered 2012-03-30: 7 mL

## 2012-03-30 MED ORDER — MIDAZOLAM HCL 5 MG/5ML IJ SOLN
INTRAMUSCULAR | Status: DC | PRN
  Administered 2012-03-30: 2 mg via INTRAVENOUS

## 2012-03-30 SURGICAL SUPPLY — 37 items
CANISTER SUCTION 2500CC (MISCELLANEOUS) ×2 IMPLANT
CLIP TI MEDIUM 6 (CLIP) ×2 IMPLANT
CLIP TI WIDE RED SMALL 6 (CLIP) ×4 IMPLANT
CLOTH BEACON ORANGE TIMEOUT ST (SAFETY) ×2 IMPLANT
COVER PROBE W GEL 5X96 (DRAPES) ×2 IMPLANT
COVER SURGICAL LIGHT HANDLE (MISCELLANEOUS) ×4 IMPLANT
DECANTER SPIKE VIAL GLASS SM (MISCELLANEOUS) ×2 IMPLANT
DERMABOND ADVANCED (GAUZE/BANDAGES/DRESSINGS) ×1
DERMABOND ADVANCED .7 DNX12 (GAUZE/BANDAGES/DRESSINGS) ×1 IMPLANT
DRAIN PENROSE 1/2X12 LTX STRL (WOUND CARE) IMPLANT
ELECT REM PT RETURN 9FT ADLT (ELECTROSURGICAL) ×2
ELECTRODE REM PT RTRN 9FT ADLT (ELECTROSURGICAL) ×1 IMPLANT
GLOVE BIO SURGEON STRL SZ7.5 (GLOVE) ×6 IMPLANT
GLOVE BIOGEL PI IND STRL 6.5 (GLOVE) ×1 IMPLANT
GLOVE BIOGEL PI IND STRL 7.0 (GLOVE) ×2 IMPLANT
GLOVE BIOGEL PI IND STRL 7.5 (GLOVE) ×1 IMPLANT
GLOVE BIOGEL PI INDICATOR 6.5 (GLOVE) ×1
GLOVE BIOGEL PI INDICATOR 7.0 (GLOVE) ×2
GLOVE BIOGEL PI INDICATOR 7.5 (GLOVE) ×1
GLOVE SURG SS PI 6.0 STRL IVOR (GLOVE) ×2 IMPLANT
GLOVE SURG SS PI 7.5 STRL IVOR (GLOVE) ×2 IMPLANT
GOWN STRL NON-REIN LRG LVL3 (GOWN DISPOSABLE) ×4 IMPLANT
KIT BASIN OR (CUSTOM PROCEDURE TRAY) ×2 IMPLANT
KIT ROOM TURNOVER OR (KITS) ×2 IMPLANT
NS IRRIG 1000ML POUR BTL (IV SOLUTION) ×2 IMPLANT
PACK CV ACCESS (CUSTOM PROCEDURE TRAY) ×2 IMPLANT
PAD ARMBOARD 7.5X6 YLW CONV (MISCELLANEOUS) ×4 IMPLANT
SPONGE GAUZE 4X4 12PLY (GAUZE/BANDAGES/DRESSINGS) ×2 IMPLANT
SPONGE SURGIFOAM ABS GEL 100 (HEMOSTASIS) IMPLANT
SUT PROLENE 6 0 BV (SUTURE) ×2 IMPLANT
SUT VIC AB 3-0 SH 27 (SUTURE) ×1
SUT VIC AB 3-0 SH 27X BRD (SUTURE) ×1 IMPLANT
SUT VICRYL 4-0 PS2 18IN ABS (SUTURE) ×2 IMPLANT
TOWEL OR 17X24 6PK STRL BLUE (TOWEL DISPOSABLE) ×2 IMPLANT
TOWEL OR 17X26 10 PK STRL BLUE (TOWEL DISPOSABLE) ×2 IMPLANT
UNDERPAD 30X30 INCONTINENT (UNDERPADS AND DIAPERS) ×2 IMPLANT
WATER STERILE IRR 1000ML POUR (IV SOLUTION) ×2 IMPLANT

## 2012-03-30 NOTE — Transfer of Care (Signed)
Immediate Anesthesia Transfer of Care Note  Patient: Matthew Mays  Procedure(s) Performed: Procedure(s) (LRB): ARTERIOVENOUS (AV) FISTULA CREATION (Left)  Patient Location: PACU  Anesthesia Type: MAC  Level of Consciousness: awake, alert  and oriented  Airway & Oxygen Therapy: Patient Spontanous Breathing and Patient connected to face mask oxygen  Post-op Assessment: Report given to PACU RN  Post vital signs: Reviewed and stable  Complications: No apparent anesthesia complications

## 2012-03-30 NOTE — Telephone Encounter (Addendum)
Message copied by Rosalyn Charters on Thu Mar 30, 2012  4:31 PM ------      Message from: Melene Plan      Created: Thu Mar 30, 2012  3:42 PM      Regarding: FW: charges and follow up                   ----- Message -----         From: Chuck Hint, MD         Sent: 03/30/2012   2:45 PM           To: Reuel Derby, Melene Plan, RN      Subject: charges and follow up                                    PROCEDURE: left radial cephalic AV fistula            SURGEON: Di Kindle. Edilia Bo, MD, FACS            ASSIST: Della Goo PA            He will need a follow up visit in 6 weeks to check on the maturation of this fistula. He should have a duplex scan for that visit.            [facility]  UNABLE TO REACH PT. BY PHONE MAILED APPOINTMENT LETTER FOR JUNE 05-10-12 3pm

## 2012-03-30 NOTE — Procedures (Unsigned)
CEPHALIC VEIN MAPPING  INDICATION:  Chronic kidney disease, stage V.  HISTORY:  EXAM:  The right cephalic vein is compressible.  Diameter measurements range from 0.25 to 0.38 cm.  The right basilic vein is compressible.  Diameter measurements range from 0.43 to 0.44 cm.  The left cephalic vein is compressible.  Diameter measurements range from 0.30 to 0.42 cm.  The left basilic vein is compressible.  Diameter measurements  of  0.46 cm.  See attached worksheet for all measurements.  IMPRESSION: 1. Patent bilateral cephalic and basilic veins with diameter     measurements as described above and as noted on worksheet. 2. 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  ___________________________________________ Di Kindle. Edilia Bo, M.D.  SH/MEDQ  D:  03/22/2012  T:  03/22/2012  Job:  [id number]

## 2012-03-30 NOTE — H&P (View-Only) (Signed)
[facility] of [facility]  Patient name: Matthew Mays MRN: [id number] DOB: 07/22/1941 Sex: male  REASON FOR CONSULT: evaluate for hemodialysis access. Referred by Dr. Fabio Pierce.  HPI: Matthew Mays is a 71 y.o. male who had a left upper arm AV graft placed many years ago that was never used. He was seen by Dr. Carmelia Roller the office it was noted this graft was occluded. Was not clear for how long. He sent for evaluation for new access. Of note his had no recent uremic symptoms. Specifically he denies nausea, vomiting, palpitations, fatigue, or anorexia.  He has a history of hypertension which is been well controlled on his current medications. He's had previous sinus tachycardia but has had no recent episodes.   Past Medical History  Diagnosis Date  . Bladder cancer   . Chronic kidney disease   . Secondary hyperparathyroidism   . H/O sinus tachycardia   . Hypertension   . Prediabetes     Family History  Problem Relation Age of Onset  . Cancer Mother     BRAIN    SOCIAL HISTORY: History  Substance Use Topics  . Smoking status: Former Smoker -- 2.0 packs/day for 40 years    Types: Cigarettes    Quit date: 03/22/2006  . Smokeless tobacco: Never Used  . Alcohol Use: Yes    No Known Allergies  Current Outpatient Prescriptions  Medication Sig Dispense Refill  . calcitRIOL (ROCALTROL) 0.5 MCG capsule Take 0.5 mcg by mouth daily.      . calcium carbonate (TUMS EX) 750 MG chewable tablet Chew 4 tablets by mouth.      . dorzolamide (TRUSOPT) 2 % ophthalmic solution Place 2 drops into the right eye daily.      . furosemide (LASIX) 40 MG tablet Take 40 mg by mouth daily.      . Iron 66 MG TABS Take 65 mg by mouth daily.      . metoprolol (LOPRESSOR) 50 MG tablet Take 50 mg by mouth 2 (two) times daily.      . sodium bicarbonate 650 MG tablet Take 650 mg by mouth 4 (four) times daily.        REVIEW OF SYSTEMS: Arly.Keller ] denotes positive finding; [  ] denotes negative  finding  CARDIOVASCULAR:  [ ]  chest pain   [ ]  chest pressure   [ ]  palpitations   [ ]  orthopnea   [ ]  dyspnea on exertion   [ ]  claudication   [ ]  rest pain   [ ]  DVT   [ ]  phlebitis PULMONARY:   [ ]  productive cough   [ ]  asthma   [ ]  wheezing NEUROLOGIC:   [ ]  weakness  [ ]  paresthesias  [ ]  aphasia  [ ]  amaurosis  [ ]  dizziness HEMATOLOGIC:   [ ]  bleeding problems   [ ]  clotting disorders MUSCULOSKELETAL:  [ ]  joint pain   [ ]  joint swelling [ ]  leg swelling GASTROINTESTINAL: [ ]   blood in stool  [ ]   hematemesis GENITOURINARY:  [ ]   dysuria  [ ]   hematuria PSYCHIATRIC:  [ ]  history of major depression INTEGUMENTARY:  [ ]  rashes  [ ]  ulcers CONSTITUTIONAL:  [ ]  fever   [ ]  chills  PHYSICAL EXAM: Filed Vitals:   03/22/12 1508  BP: 176/78  Pulse: 103  Resp: 20  Height: 5\' 10"  (1.778 m)  Weight: 187 lb (84.823 kg)   Body mass index is 26.83 kg/(m^2). GENERAL:  The patient is a well-nourished male, in no acute distress. The vital signs are documented above. CARDIOVASCULAR: There is a regular rate and rhythm without significant murmur appreciated. He has palpable radial pulses bilaterally. PULMONARY: There is good air exchange bilaterally without wheezing or rales. ABDOMEN: Soft and non-tender with normal pitched bowel sounds.  MUSCULOSKELETAL: There are no major deformities or cyanosis. NEUROLOGIC: No focal weakness or paresthesias are detected. SKIN: There are no ulcers or rashes noted. PSYCHIATRIC: The patient has a normal affect.  DATA:  I have independently interpreted his vein mapping. This shows that his upper arm graft in the left is occluded. The vein in the forearm measures 0.3-0.38 cm in diameter. The upper arm cephalic vein measures 0.3 8.42 cm in maximum diameter. The basilic vein is at very short in the upper arm.  I have reviewed his records from Dr. Pricilla Loveless office. He has stage V chronic kidney disease. His had a previous prior cystectomy for cancer the bladder.  Also has secondary hyperparathyroidism which has been stable.  MEDICAL ISSUES: I've recommended that we explore his forearm cephalic vein on the left and potentially place a radiocephalic fistula. The second option for a fistula would be an upper arm fistula. If neither were adequate I do not think the basilic vein would be usable so we would likely have to place a new graft in the left arm. I have explained the indications for placement of an AV fistula or AV graft. I've explained that if at all possible we will place an AV fistula.  I have reviewed the risks of placement of an AV fistula including but not limited to: failure of the fistula to mature, need for subsequent interventions, and thrombosis. In addition I have reviewed the potential complications of placement of an AV graft. These risks include, but are not limited to, graft thrombosis, graft infection, wound healing problems, bleeding, arm swelling, and steal syndrome. All the patient's questions were answered and they are agreeable to proceed with surgery. His surgery has been scheduled for 03/30/2012.   Dora Simeone S [facility] [place] [facility]: [phone]

## 2012-03-30 NOTE — Op Note (Signed)
NAME: Matthew Mays   MRN: [id number] DOB: 01/03/1941    DATE OF OPERATION: 03/30/2012  PREOP DIAGNOSIS: chronic kidney disease  POSTOP DIAGNOSIS: same  PROCEDURE: left radial cephalic AV fistula  SURGEON: Di Kindle. Edilia Bo, MD, FACS  ASSIST: Della Goo PA  ANESTHESIA: local with sedation   EBL: minimal  INDICATIONS: Matthew Mays is a 71 y.o. male who is not yet on dialysis. He has had a previous left upper arm AV graft. He is brought in for elective placement of a fistula in the left arm.  FINDINGS: the vein was 3.5 mm. The artery was calcified but patent.  TECHNIQUE: The patient was brought to the operating room and sedated by anesthesia. The left upper extremity was prepped and draped in the usual sterile fashion. An oblique incision was made in the left wrist and the cephalic vein was dissected free. It was ligated distally. It irrigated up with heparinized saline and was a 3.5 mm vein. The radial artery was dissected free beneath the fascia. It was calcified but patent. The patient was heparinized. The radial artery was clamped proximally and distally and a longitudinal arteriotomy was made. The vein was spatulated and sewn end to side to the artery using continuous 6-0 Prolene suture. At the completion was a good thrill in the fistula. Hemostasis was obtained in the wound and the wounds closed with a deep layer of 3-0 Vicryl. Skin was closed with a 4-0 subcuticular stitch. Dermabond was applied. The patient tolerated the procedure well and was transferred to the recovery room in stable condition. All needle and sponge counts were correct.  Matthew Ferrari, MD, FACS [facility]  DATE OF DICTATION:   03/30/2012

## 2012-03-30 NOTE — Preoperative (Signed)
Beta Blockers   Reason not to administer Beta Blockers:Not Applicable, last dose 03/30/12 @0400 

## 2012-03-30 NOTE — Anesthesia Postprocedure Evaluation (Signed)
  Anesthesia Post-op Note  Patient: Matthew Mays  Procedure(s) Performed: Procedure(s) (LRB): ARTERIOVENOUS (AV) FISTULA CREATION (Left)  Patient Location: PACU  Anesthesia Type: MAC  Level of Consciousness: awake, alert  and oriented  Airway and Oxygen Therapy: Patient Spontanous Breathing and Patient connected to nasal cannula oxygen  Post-op Pain: none  Post-op Assessment: Post-op Vital signs reviewed and Patient's Cardiovascular Status Stable  Post-op Vital Signs: stable  Complications: No apparent anesthesia complications

## 2012-03-30 NOTE — Interval H&P Note (Signed)
History and Physical Interval Note:  03/30/2012 12:53 PM  Matthew Mays  has presented today for surgery, with the diagnosis of End Stage Renal Disease  The various methods of treatment have been discussed with the patient and family. After consideration of risks, benefits and other options for treatment, the patient has consented to: ARTERIOVENOUS (AV) FISTULA CREATION (Left) as a surgical intervention .  The patients' history has been reviewed, patient examined, no change in status, stable for surgery.  I have reviewed the patients' chart and labs.  Questions were answered to the patient's satisfaction.     [name]

## 2012-03-30 NOTE — Anesthesia Preprocedure Evaluation (Addendum)
Anesthesia Evaluation  Patient identified by MRN, date of birth, ID band Patient awake    Reviewed: Allergy & Precautions, H&P , NPO status , Patient's Chart, lab work & pertinent test results, reviewed documented beta blocker date and time   Airway Mallampati: II  Neck ROM: Full    Dental  (+) Teeth Intact and Dental Advisory Given   Pulmonary  breath sounds clear to auscultation        Cardiovascular Rhythm:Regular Rate:Normal     Neuro/Psych    GI/Hepatic   Endo/Other    Renal/GU      Musculoskeletal   Abdominal   Peds  Hematology   Anesthesia Other Findings   Reproductive/Obstetrics                          Anesthesia Physical Anesthesia Plan  ASA: III  Anesthesia Plan: MAC   Post-op Pain Management:    Induction: Intravenous  Airway Management Planned: Nasal Cannula  Additional Equipment:   Intra-op Plan:   Post-operative Plan:   Informed Consent: I have reviewed the patients History and Physical, chart, labs and discussed the procedure including the risks, benefits and alternatives for the proposed anesthesia with the patient or authorized representative who has indicated his/her understanding and acceptance.   Dental advisory given  Plan Discussed with:   Anesthesia Plan Comments: (ESRD has not started HD K-5.0 Htn H/O bladder Ca, S/P cystectomy 6 years ago, has ileostomy   Plan MAC  Kipp Brood, MD)        Anesthesia Quick Evaluation

## 2012-03-31 ENCOUNTER — Other Ambulatory Visit: Payer: Self-pay | Admitting: *Deleted

## 2012-03-31 ENCOUNTER — Encounter (HOSPITAL_COMMUNITY): Payer: Self-pay | Admitting: Vascular Surgery

## 2012-03-31 DIAGNOSIS — N186 End stage renal disease: Secondary | ICD-10-CM

## 2012-03-31 DIAGNOSIS — Z4931 Encounter for adequacy testing for hemodialysis: Secondary | ICD-10-CM

## 2012-03-31 LAB — POCT I-STAT 4, (NA,K, GLUC, HGB,HCT)
Potassium: 5 mEq/L (ref 3.5–5.1)
Sodium: 137 mEq/L (ref 135–145)

## 2012-04-12 ENCOUNTER — Ambulatory Visit: Payer: Self-pay | Admitting: Vascular Surgery

## 2012-05-09 ENCOUNTER — Encounter: Payer: Self-pay | Admitting: Vascular Surgery

## 2012-05-10 ENCOUNTER — Ambulatory Visit (INDEPENDENT_AMBULATORY_CARE_PROVIDER_SITE_OTHER): Payer: Medicare Other | Admitting: Vascular Surgery

## 2012-05-10 ENCOUNTER — Encounter: Payer: Self-pay | Admitting: Vascular Surgery

## 2012-05-10 VITALS — BP 164/70 | HR 71 | Temp 98.5°F | Ht 70.0 in | Wt 185.0 lb

## 2012-05-10 DIAGNOSIS — T82598A Other mechanical complication of other cardiac and vascular devices and implants, initial encounter: Secondary | ICD-10-CM

## 2012-05-10 DIAGNOSIS — N186 End stage renal disease: Secondary | ICD-10-CM

## 2012-05-10 DIAGNOSIS — Z4931 Encounter for adequacy testing for hemodialysis: Secondary | ICD-10-CM

## 2012-05-10 NOTE — Progress Notes (Signed)
Vascular and Vein Specialist of [facility]  Patient name: Matthew Mays MRN: [id number] DOB: [birth year] Sex: male  REASON FOR VISIT: follow up after left radiocephalic AV fistula appeared  HPI: [name] is a 71 y.o. male who had a left radiocephalic AV fistula performed on 03/30/2012. He comes in for a 6 week follow up visit. He has no complaints referrable to the left arm. He has no paresthesias. He is not yet on dialysis.   REVIEW OF SYSTEMS: Arly.Keller ] denotes positive finding; [  ] denotes negative finding  CARDIOVASCULAR:  [ ]  chest pain   [ ]  dyspnea on exertion    CONSTITUTIONAL:  [ ]  fever   [ ]  chills  PHYSICAL EXAM: Filed Vitals:   05/10/12 1553  BP: 164/70  Pulse: 71  Temp: 98.5 F (36.9 C)  TempSrc: Oral  Height: 5\' 10"  (1.778 m)  Weight: 185 lb (83.915 kg)  SpO2: 99%   Body mass index is 26.54 kg/(m^2). GENERAL: The patient is a well-nourished male, in no acute distress. The vital signs are documented above. CARDIOVASCULAR: There is a regular rate and rhythm  PULMONARY: There is good air exchange bilaterally without wheezing or rales. Fistula has a good thrill and can be followed up the forearm.  I have independently interpreted his duplex of his fistula which shows that the depths ranged from 0.26-0.4 cm. Diameters range from 0.4-0.49 cm. I do not see any areas of significant stenosis within the fistula. There are 2 small competing branches. The radial artery was somewhat calcified.  MEDICAL ISSUES:  End stage renal disease This patient's left radiocephalic AV fistula appears to be gradually maturing. The fistula has a good thrill. The depths of the fistula is good. Diameters range from 0.4-0.49 cm. I have suggested a follow up duplex scan in 6 months our the patient would prefer to hold off on this. Certainly if after he is seen by Dr. Casimiro Needle there are any concerns we can arrange for that. Otherwise I'll see him back as needed. I've encouraged him to continue  exercises hand.   DICKSON,CHRISTOPHER S [facility] [place] [facility]: [phone]

## 2012-05-10 NOTE — Assessment & Plan Note (Signed)
This patient's left radiocephalic AV fistula appears to be gradually maturing. The fistula has a good thrill. The depths of the fistula is good. Diameters range from 0.4-0.49 cm. I have suggested a follow up duplex scan in 6 months our the patient would prefer to hold off on this. Certainly if after he is seen by Dr. Casimiro Needle there are any concerns we can arrange for that. Otherwise I'll see him back as needed. I've encouraged him to continue exercises hand.

## 2012-05-10 NOTE — Progress Notes (Signed)
Lt radiocephalic avf duplex performed @ [facility] 05/10/2012

## 2012-05-18 NOTE — Procedures (Unsigned)
VASCULAR LAB EXAM  INDICATION:  Complications, arteriovenous fistula.  HISTORY: Diabetes: Cardiac: Hypertension:  EXAM:  Left radiocephalic arteriovenous fistula duplex.  IMPRESSION: 1. Patent left arterial inflow with vessel wall calcification noted. 2. Left arterial outflow is retrograde. 3. Left distal forearm venous outflow, suggestive of stenosis with a     peak systolic velocity of 672 cm/s. 4. One competing branch noted in the distal forearm of the venous     outflow. 5. Remainder of venous outflow is patent to the level of the mid upper     arm without hemodynamic changes evident.  ___________________________________________ Di Kindle. Edilia Bo, M.D.  SH/MEDQ  D:  05/10/2012  T:  05/10/2012  Job:  [id number]

## 2012-07-04 DIAGNOSIS — E119 Type 2 diabetes mellitus without complications: Secondary | ICD-10-CM | POA: Diagnosis not present

## 2012-07-04 DIAGNOSIS — N185 Chronic kidney disease, stage 5: Secondary | ICD-10-CM | POA: Diagnosis not present

## 2012-07-04 DIAGNOSIS — N2581 Secondary hyperparathyroidism of renal origin: Secondary | ICD-10-CM | POA: Diagnosis not present

## 2012-07-12 DIAGNOSIS — Z8551 Personal history of malignant neoplasm of bladder: Secondary | ICD-10-CM | POA: Diagnosis not present

## 2012-07-12 DIAGNOSIS — R310 Gross hematuria: Secondary | ICD-10-CM | POA: Diagnosis not present

## 2012-07-21 DIAGNOSIS — R310 Gross hematuria: Secondary | ICD-10-CM | POA: Diagnosis not present

## 2012-07-21 DIAGNOSIS — C679 Malignant neoplasm of bladder, unspecified: Secondary | ICD-10-CM | POA: Diagnosis not present

## 2012-07-21 DIAGNOSIS — Z8551 Personal history of malignant neoplasm of bladder: Secondary | ICD-10-CM | POA: Diagnosis not present

## 2012-08-14 DIAGNOSIS — R310 Gross hematuria: Secondary | ICD-10-CM | POA: Diagnosis not present

## 2012-08-14 DIAGNOSIS — Z8551 Personal history of malignant neoplasm of bladder: Secondary | ICD-10-CM | POA: Diagnosis not present

## 2012-08-22 DIAGNOSIS — Z961 Presence of intraocular lens: Secondary | ICD-10-CM | POA: Diagnosis not present

## 2012-08-22 DIAGNOSIS — H0230 Blepharochalasis unspecified eye, unspecified eyelid: Secondary | ICD-10-CM | POA: Diagnosis not present

## 2012-08-22 DIAGNOSIS — H40059 Ocular hypertension, unspecified eye: Secondary | ICD-10-CM | POA: Diagnosis not present

## 2012-11-07 DIAGNOSIS — R7309 Other abnormal glucose: Secondary | ICD-10-CM | POA: Diagnosis not present

## 2012-11-07 DIAGNOSIS — N185 Chronic kidney disease, stage 5: Secondary | ICD-10-CM | POA: Diagnosis not present

## 2012-11-07 DIAGNOSIS — Z23 Encounter for immunization: Secondary | ICD-10-CM | POA: Diagnosis not present

## 2013-03-07 DIAGNOSIS — E119 Type 2 diabetes mellitus without complications: Secondary | ICD-10-CM | POA: Diagnosis not present

## 2013-03-07 DIAGNOSIS — N185 Chronic kidney disease, stage 5: Secondary | ICD-10-CM | POA: Diagnosis not present

## 2013-03-07 DIAGNOSIS — E559 Vitamin D deficiency, unspecified: Secondary | ICD-10-CM | POA: Diagnosis not present

## 2013-03-08 DIAGNOSIS — Z961 Presence of intraocular lens: Secondary | ICD-10-CM | POA: Diagnosis not present

## 2013-03-08 DIAGNOSIS — H40059 Ocular hypertension, unspecified eye: Secondary | ICD-10-CM | POA: Diagnosis not present

## 2013-03-08 DIAGNOSIS — H0230 Blepharochalasis unspecified eye, unspecified eyelid: Secondary | ICD-10-CM | POA: Diagnosis not present

## 2013-07-05 DIAGNOSIS — N185 Chronic kidney disease, stage 5: Secondary | ICD-10-CM | POA: Diagnosis not present

## 2013-07-05 DIAGNOSIS — E119 Type 2 diabetes mellitus without complications: Secondary | ICD-10-CM | POA: Diagnosis not present

## 2013-09-04 DIAGNOSIS — H0230 Blepharochalasis unspecified eye, unspecified eyelid: Secondary | ICD-10-CM | POA: Diagnosis not present

## 2013-09-04 DIAGNOSIS — Z961 Presence of intraocular lens: Secondary | ICD-10-CM | POA: Diagnosis not present

## 2013-09-04 DIAGNOSIS — H18429 Band keratopathy, unspecified eye: Secondary | ICD-10-CM | POA: Diagnosis not present

## 2013-09-04 DIAGNOSIS — H40059 Ocular hypertension, unspecified eye: Secondary | ICD-10-CM | POA: Diagnosis not present

## 2013-11-05 DIAGNOSIS — Z23 Encounter for immunization: Secondary | ICD-10-CM | POA: Diagnosis not present

## 2013-11-05 DIAGNOSIS — I120 Hypertensive chronic kidney disease with stage 5 chronic kidney disease or end stage renal disease: Secondary | ICD-10-CM | POA: Diagnosis not present

## 2013-11-05 DIAGNOSIS — N185 Chronic kidney disease, stage 5: Secondary | ICD-10-CM | POA: Diagnosis not present

## 2014-03-13 DIAGNOSIS — H18429 Band keratopathy, unspecified eye: Secondary | ICD-10-CM | POA: Diagnosis not present

## 2014-03-13 DIAGNOSIS — Z961 Presence of intraocular lens: Secondary | ICD-10-CM | POA: Diagnosis not present

## 2014-03-13 DIAGNOSIS — H40059 Ocular hypertension, unspecified eye: Secondary | ICD-10-CM | POA: Diagnosis not present

## 2014-03-14 DIAGNOSIS — N185 Chronic kidney disease, stage 5: Secondary | ICD-10-CM | POA: Diagnosis not present

## 2014-03-14 DIAGNOSIS — I10 Essential (primary) hypertension: Secondary | ICD-10-CM | POA: Diagnosis not present

## 2014-09-12 DIAGNOSIS — H40053 Ocular hypertension, bilateral: Secondary | ICD-10-CM | POA: Diagnosis not present

## 2014-09-12 DIAGNOSIS — H18423 Band keratopathy, bilateral: Secondary | ICD-10-CM | POA: Diagnosis not present

## 2014-09-12 DIAGNOSIS — Z961 Presence of intraocular lens: Secondary | ICD-10-CM | POA: Diagnosis not present

## 2014-09-24 DIAGNOSIS — N185 Chronic kidney disease, stage 5: Secondary | ICD-10-CM | POA: Diagnosis not present

## 2014-09-24 DIAGNOSIS — E8770 Fluid overload, unspecified: Secondary | ICD-10-CM | POA: Diagnosis not present

## 2014-09-24 DIAGNOSIS — I10 Essential (primary) hypertension: Secondary | ICD-10-CM | POA: Diagnosis not present

## 2014-09-24 DIAGNOSIS — E559 Vitamin D deficiency, unspecified: Secondary | ICD-10-CM | POA: Diagnosis not present

## 2014-09-24 DIAGNOSIS — Z23 Encounter for immunization: Secondary | ICD-10-CM | POA: Diagnosis not present

## 2014-10-09 DIAGNOSIS — N185 Chronic kidney disease, stage 5: Secondary | ICD-10-CM | POA: Diagnosis not present

## 2015-03-20 DIAGNOSIS — H01021 Squamous blepharitis right upper eyelid: Secondary | ICD-10-CM | POA: Diagnosis not present

## 2015-03-20 DIAGNOSIS — H01024 Squamous blepharitis left upper eyelid: Secondary | ICD-10-CM | POA: Diagnosis not present

## 2015-03-20 DIAGNOSIS — H18423 Band keratopathy, bilateral: Secondary | ICD-10-CM | POA: Diagnosis not present

## 2015-03-20 DIAGNOSIS — Z961 Presence of intraocular lens: Secondary | ICD-10-CM | POA: Diagnosis not present

## 2015-03-20 DIAGNOSIS — H01025 Squamous blepharitis left lower eyelid: Secondary | ICD-10-CM | POA: Diagnosis not present

## 2015-03-20 DIAGNOSIS — H40053 Ocular hypertension, bilateral: Secondary | ICD-10-CM | POA: Diagnosis not present

## 2015-03-20 DIAGNOSIS — H01022 Squamous blepharitis right lower eyelid: Secondary | ICD-10-CM | POA: Diagnosis not present

## 2015-06-04 DIAGNOSIS — E8770 Fluid overload, unspecified: Secondary | ICD-10-CM | POA: Diagnosis not present

## 2015-06-04 DIAGNOSIS — I10 Essential (primary) hypertension: Secondary | ICD-10-CM | POA: Diagnosis not present

## 2015-06-04 DIAGNOSIS — N185 Chronic kidney disease, stage 5: Secondary | ICD-10-CM | POA: Diagnosis not present

## 2015-06-04 DIAGNOSIS — E559 Vitamin D deficiency, unspecified: Secondary | ICD-10-CM | POA: Diagnosis not present

## 2015-12-17 DIAGNOSIS — H18423 Band keratopathy, bilateral: Secondary | ICD-10-CM | POA: Diagnosis not present

## 2015-12-17 DIAGNOSIS — H40053 Ocular hypertension, bilateral: Secondary | ICD-10-CM | POA: Diagnosis not present

## 2015-12-17 DIAGNOSIS — Z961 Presence of intraocular lens: Secondary | ICD-10-CM | POA: Diagnosis not present

## 2016-02-18 DIAGNOSIS — E8770 Fluid overload, unspecified: Secondary | ICD-10-CM | POA: Diagnosis not present

## 2016-02-18 DIAGNOSIS — I10 Essential (primary) hypertension: Secondary | ICD-10-CM | POA: Diagnosis not present

## 2016-02-18 DIAGNOSIS — N185 Chronic kidney disease, stage 5: Secondary | ICD-10-CM | POA: Diagnosis not present

## 2016-04-01 DIAGNOSIS — N185 Chronic kidney disease, stage 5: Secondary | ICD-10-CM | POA: Diagnosis not present

## 2016-06-07 DIAGNOSIS — I10 Essential (primary) hypertension: Secondary | ICD-10-CM | POA: Diagnosis not present

## 2016-06-07 DIAGNOSIS — E8770 Fluid overload, unspecified: Secondary | ICD-10-CM | POA: Diagnosis not present

## 2016-06-07 DIAGNOSIS — N185 Chronic kidney disease, stage 5: Secondary | ICD-10-CM | POA: Diagnosis not present

## 2016-06-15 DIAGNOSIS — H18423 Band keratopathy, bilateral: Secondary | ICD-10-CM | POA: Diagnosis not present

## 2016-06-15 DIAGNOSIS — H01025 Squamous blepharitis left lower eyelid: Secondary | ICD-10-CM | POA: Diagnosis not present

## 2016-06-15 DIAGNOSIS — H01022 Squamous blepharitis right lower eyelid: Secondary | ICD-10-CM | POA: Diagnosis not present

## 2016-06-15 DIAGNOSIS — Z961 Presence of intraocular lens: Secondary | ICD-10-CM | POA: Diagnosis not present

## 2016-06-15 DIAGNOSIS — H40053 Ocular hypertension, bilateral: Secondary | ICD-10-CM | POA: Diagnosis not present

## 2016-06-15 DIAGNOSIS — H01021 Squamous blepharitis right upper eyelid: Secondary | ICD-10-CM | POA: Diagnosis not present

## 2016-06-15 DIAGNOSIS — H10023 Other mucopurulent conjunctivitis, bilateral: Secondary | ICD-10-CM | POA: Diagnosis not present

## 2016-06-15 DIAGNOSIS — H01024 Squamous blepharitis left upper eyelid: Secondary | ICD-10-CM | POA: Diagnosis not present

## 2016-06-19 ENCOUNTER — Inpatient Hospital Stay (HOSPITAL_COMMUNITY): Payer: Medicare Other

## 2016-06-19 ENCOUNTER — Emergency Department (HOSPITAL_COMMUNITY): Payer: Medicare Other

## 2016-06-19 ENCOUNTER — Encounter (HOSPITAL_COMMUNITY): Payer: Self-pay | Admitting: Emergency Medicine

## 2016-06-19 ENCOUNTER — Inpatient Hospital Stay (HOSPITAL_COMMUNITY)
Admission: EM | Admit: 2016-06-19 | Discharge: 2016-06-21 | DRG: 872 | Disposition: A | Discharge status: Home Health | Payer: Medicare Other | Attending: Family Medicine | Admitting: Family Medicine

## 2016-06-19 DIAGNOSIS — N390 Urinary tract infection, site not specified: Secondary | ICD-10-CM | POA: Diagnosis present

## 2016-06-19 DIAGNOSIS — R531 Weakness: Secondary | ICD-10-CM | POA: Diagnosis present

## 2016-06-19 DIAGNOSIS — N179 Acute kidney failure, unspecified: Secondary | ICD-10-CM | POA: Diagnosis present

## 2016-06-19 DIAGNOSIS — D631 Anemia in chronic kidney disease: Secondary | ICD-10-CM | POA: Diagnosis present

## 2016-06-19 DIAGNOSIS — A419 Sepsis, unspecified organism: Secondary | ICD-10-CM | POA: Diagnosis not present

## 2016-06-19 DIAGNOSIS — N1330 Unspecified hydronephrosis: Secondary | ICD-10-CM | POA: Diagnosis present

## 2016-06-19 DIAGNOSIS — E1165 Type 2 diabetes mellitus with hyperglycemia: Secondary | ICD-10-CM | POA: Diagnosis present

## 2016-06-19 DIAGNOSIS — N2581 Secondary hyperparathyroidism of renal origin: Secondary | ICD-10-CM | POA: Diagnosis present

## 2016-06-19 DIAGNOSIS — Z9079 Acquired absence of other genital organ(s): Secondary | ICD-10-CM | POA: Diagnosis not present

## 2016-06-19 DIAGNOSIS — I120 Hypertensive chronic kidney disease with stage 5 chronic kidney disease or end stage renal disease: Secondary | ICD-10-CM | POA: Diagnosis present

## 2016-06-19 DIAGNOSIS — A4159 Other Gram-negative sepsis: Principal | ICD-10-CM | POA: Diagnosis present

## 2016-06-19 DIAGNOSIS — Z8551 Personal history of malignant neoplasm of bladder: Secondary | ICD-10-CM | POA: Diagnosis not present

## 2016-06-19 DIAGNOSIS — N185 Chronic kidney disease, stage 5: Secondary | ICD-10-CM | POA: Diagnosis not present

## 2016-06-19 DIAGNOSIS — I129 Hypertensive chronic kidney disease with stage 1 through stage 4 chronic kidney disease, or unspecified chronic kidney disease: Secondary | ICD-10-CM | POA: Diagnosis not present

## 2016-06-19 DIAGNOSIS — Z906 Acquired absence of other parts of urinary tract: Secondary | ICD-10-CM | POA: Diagnosis not present

## 2016-06-19 DIAGNOSIS — R404 Transient alteration of awareness: Secondary | ICD-10-CM | POA: Diagnosis not present

## 2016-06-19 DIAGNOSIS — N184 Chronic kidney disease, stage 4 (severe): Secondary | ICD-10-CM | POA: Diagnosis not present

## 2016-06-19 DIAGNOSIS — E869 Volume depletion, unspecified: Secondary | ICD-10-CM | POA: Diagnosis not present

## 2016-06-19 DIAGNOSIS — R739 Hyperglycemia, unspecified: Secondary | ICD-10-CM

## 2016-06-19 DIAGNOSIS — E1122 Type 2 diabetes mellitus with diabetic chronic kidney disease: Secondary | ICD-10-CM | POA: Diagnosis present

## 2016-06-19 DIAGNOSIS — Z87891 Personal history of nicotine dependence: Secondary | ICD-10-CM

## 2016-06-19 DIAGNOSIS — E1129 Type 2 diabetes mellitus with other diabetic kidney complication: Secondary | ICD-10-CM | POA: Diagnosis not present

## 2016-06-19 LAB — I-STAT CG4 LACTIC ACID, ED
Lactic Acid, Venous: 2.27 mmol/L (ref 0.5–1.9)
Lactic Acid, Venous: 1.86 mmol/L (ref 0.5–1.9)

## 2016-06-19 LAB — URINALYSIS, ROUTINE W REFLEX MICROSCOPIC
Bilirubin Urine: NEGATIVE
GLUCOSE, UA: NEGATIVE mg/dL
Ketones, ur: NEGATIVE mg/dL
NITRITE: NEGATIVE
PROTEIN: 100 mg/dL — AB
Specific Gravity, Urine: 1.014 (ref 1.005–1.030)
pH: 6.5 (ref 5.0–8.0)

## 2016-06-19 LAB — BASIC METABOLIC PANEL
ANION GAP: 11 (ref 5–15)
BUN: 101 mg/dL — ABNORMAL HIGH (ref 6–20)
CALCIUM: 8.5 mg/dL — AB (ref 8.9–10.3)
CO2: 18 mmol/L — AB (ref 22–32)
CREATININE: 8.62 mg/dL — AB (ref 0.61–1.24)
Chloride: 111 mmol/L (ref 101–111)
GFR, EST AFRICAN AMERICAN: 6 mL/min — AB (ref >60)
GFR, EST NON AFRICAN AMERICAN: 5 mL/min — AB (ref >60)
Glucose, Bld: 156 mg/dL — ABNORMAL HIGH (ref 65–99)
Potassium: 4.3 mmol/L (ref 3.5–5.1)
SODIUM: 140 mmol/L (ref 135–145)

## 2016-06-19 LAB — CBC WITH DIFFERENTIAL/PLATELET
BASOS ABS: 0 10*3/uL (ref 0.0–0.1)
Basophils Relative: 0 %
EOS PCT: 0 %
Eosinophils Absolute: 0 10*3/uL (ref 0.0–0.7)
HCT: 28.8 % — ABNORMAL LOW (ref 39.0–52.0)
Hemoglobin: 9.2 g/dL — ABNORMAL LOW (ref 13.0–17.0)
LYMPHS PCT: 2 %
Lymphs Abs: 0.1 10*3/uL — ABNORMAL LOW (ref 0.7–4.0)
MCH: 31.5 pg (ref 26.0–34.0)
MCHC: 31.9 g/dL (ref 30.0–36.0)
MCV: 98.6 fL (ref 78.0–100.0)
Monocytes Absolute: 0.1 10*3/uL (ref 0.1–1.0)
Monocytes Relative: 1 %
Neutro Abs: 8.2 10*3/uL — ABNORMAL HIGH (ref 1.7–7.7)
Neutrophils Relative %: 97 %
PLATELETS: 174 10*3/uL (ref 150–400)
RBC: 2.92 MIL/uL — AB (ref 4.22–5.81)
RDW: 15.1 % (ref 11.5–15.5)
WBC: 8.4 10*3/uL (ref 4.0–10.5)

## 2016-06-19 LAB — URINE MICROSCOPIC-ADD ON

## 2016-06-19 LAB — COMPREHENSIVE METABOLIC PANEL
ALK PHOS: 51 U/L (ref 38–126)
ALT: 11 U/L — ABNORMAL LOW (ref 17–63)
ANION GAP: 14 (ref 5–15)
AST: 12 U/L — ABNORMAL LOW (ref 15–41)
Albumin: 3.3 g/dL — ABNORMAL LOW (ref 3.5–5.0)
BUN: 110 mg/dL — ABNORMAL HIGH (ref 6–20)
CALCIUM: 9.3 mg/dL (ref 8.9–10.3)
CHLORIDE: 108 mmol/L (ref 101–111)
CO2: 16 mmol/L — AB (ref 22–32)
Creatinine, Ser: 9.11 mg/dL — ABNORMAL HIGH (ref 0.61–1.24)
GFR calc non Af Amer: 5 mL/min — ABNORMAL LOW (ref >60)
GFR, EST AFRICAN AMERICAN: 6 mL/min — AB (ref >60)
Glucose, Bld: 155 mg/dL — ABNORMAL HIGH (ref 65–99)
Potassium: 4.7 mmol/L (ref 3.5–5.1)
SODIUM: 138 mmol/L (ref 135–145)
Total Bilirubin: 0.6 mg/dL (ref 0.3–1.2)
Total Protein: 7.1 g/dL (ref 6.5–8.1)

## 2016-06-19 LAB — CBG MONITORING, ED: GLUCOSE-CAPILLARY: 149 mg/dL — AB (ref 65–99)

## 2016-06-19 LAB — LACTIC ACID, PLASMA
LACTIC ACID, VENOUS: 1.5 mmol/L (ref 0.5–1.9)
Lactic Acid, Venous: 1.9 mmol/L (ref 0.5–1.9)

## 2016-06-19 LAB — APTT: APTT: 33 s (ref 24–36)

## 2016-06-19 LAB — PROTIME-INR
INR: 1.19
PROTHROMBIN TIME: 15.2 s (ref 11.4–15.2)

## 2016-06-19 LAB — PROCALCITONIN: Procalcitonin: >175 ng/mL

## 2016-06-19 MED ORDER — SODIUM BICARBONATE 650 MG PO TABS
1300.0000 mg | ORAL_TABLET | Freq: Every day | ORAL | Status: DC
  Administered 2016-06-19 – 2016-06-21 (×3): 1300 mg via ORAL
  Filled 2016-06-19 (×4): qty 2

## 2016-06-19 MED ORDER — POLYETHYLENE GLYCOL 3350 17 G PO PACK: 17.0000 g | PACK | Freq: Every day | ORAL | Status: DC | PRN

## 2016-06-19 MED ORDER — ACETAMINOPHEN 500 MG PO TABS
1000.0000 mg | ORAL_TABLET | Freq: Once | ORAL | Status: AC
  Administered 2016-06-19: 1000 mg via ORAL
  Filled 2016-06-19: qty 2

## 2016-06-19 MED ORDER — ONDANSETRON HCL 4 MG PO TABS: 4.0000 mg | ORAL_TABLET | Freq: Four times a day (QID) | ORAL | Status: DC | PRN

## 2016-06-19 MED ORDER — SODIUM CHLORIDE 0.9 % IV BOLUS (SEPSIS)
1000.0000 mL | Freq: Once | INTRAVENOUS | Status: AC
  Administered 2016-06-19: 1000 mL via INTRAVENOUS

## 2016-06-19 MED ORDER — SODIUM CHLORIDE 0.9 % IV SOLN
INTRAVENOUS | Status: DC
  Administered 2016-06-19 – 2016-06-20 (×2): via INTRAVENOUS

## 2016-06-19 MED ORDER — METOPROLOL TARTRATE 50 MG PO TABS
50.0000 mg | ORAL_TABLET | Freq: Two times a day (BID) | ORAL | Status: DC
  Administered 2016-06-19 – 2016-06-21 (×5): 50 mg via ORAL
  Filled 2016-06-19 (×5): qty 1

## 2016-06-19 MED ORDER — CEFTRIAXONE SODIUM 1 G IJ SOLR: 1.0000 g | INTRAMUSCULAR | Status: DC

## 2016-06-19 MED ORDER — SODIUM CHLORIDE 0.9 % IV BOLUS (SEPSIS)
500.0000 mL | Freq: Once | INTRAVENOUS | Status: AC
  Administered 2016-06-19: 500 mL via INTRAVENOUS

## 2016-06-19 MED ORDER — ONDANSETRON HCL 4 MG/2ML IJ SOLN: 4.0000 mg | Freq: Four times a day (QID) | INTRAMUSCULAR | Status: DC | PRN

## 2016-06-19 MED ORDER — CALCIUM CARBONATE ANTACID 500 MG PO CHEW
4.0000 | CHEWABLE_TABLET | Freq: Every day | ORAL | Status: DC
  Administered 2016-06-19 – 2016-06-21 (×3): 800 mg via ORAL
  Filled 2016-06-19 (×3): qty 4

## 2016-06-19 MED ORDER — ACETAMINOPHEN 650 MG RE SUPP: 650.0000 mg | Freq: Four times a day (QID) | RECTAL | Status: DC | PRN

## 2016-06-19 MED ORDER — BISACODYL 5 MG PO TBEC: 5.0000 mg | DELAYED_RELEASE_TABLET | Freq: Every day | ORAL | Status: DC | PRN

## 2016-06-19 MED ORDER — TRAZODONE HCL 50 MG PO TABS: 25.0000 mg | ORAL_TABLET | Freq: Every evening | ORAL | Status: DC | PRN

## 2016-06-19 MED ORDER — LATANOPROST 0.005 % OP SOLN
1.0000 [drp] | Freq: Every day | OPHTHALMIC | Status: DC
  Administered 2016-06-19 – 2016-06-20 (×2): 1 [drp] via OPHTHALMIC
  Filled 2016-06-19: qty 2.5

## 2016-06-19 MED ORDER — HEPARIN SODIUM (PORCINE) 5000 UNIT/ML IJ SOLN
5000.0000 [IU] | Freq: Three times a day (TID) | INTRAMUSCULAR | Status: DC
  Filled 2016-06-19 (×2): qty 1

## 2016-06-19 MED ORDER — ACETAMINOPHEN 325 MG PO TABS: 650.0000 mg | ORAL_TABLET | Freq: Four times a day (QID) | ORAL | Status: DC | PRN

## 2016-06-19 MED ORDER — DEXTROSE 5 % IV SOLN
2.0000 g | Freq: Once | INTRAVENOUS | Status: AC
  Administered 2016-06-19: 2 g via INTRAVENOUS
  Filled 2016-06-19: qty 2

## 2016-06-19 MED ORDER — DEXTROSE 5 % IV SOLN: 2.0000 g | Freq: Once | INTRAVENOUS | Status: DC

## 2016-06-19 MED ORDER — DORZOLAMIDE HCL 2 % OP SOLN
2.0000 [drp] | Freq: Every day | OPHTHALMIC | Status: DC
  Administered 2016-06-20 – 2016-06-21 (×2): 2 [drp] via OPHTHALMIC
  Filled 2016-06-19: qty 10

## 2016-06-19 NOTE — ED Provider Notes (Signed)
[facility] DEPT Provider Note   CSN: [id number] Arrival date & time: 06/19/16  0911     History   Chief Complaint Chief Complaint  Patient presents with  . Weakness    HPI Matthew Mays is a 75 y.o. male.  HPI 75 year old male who presents with generalized weakness. He has a remote history of bladder cancer status post  cystoprostatectomy with urethrectomy with ileal conduit diversion, chronic kidney disease, hypertension, and prediabetes. States that yesterday evening while in bed he began to feel unwell with subjective fevers and chills. This morning woke up with significant generalized weakness that he was barely able to walk. Had several hours of rigors. Subsequently came to the ED for evaluation. No nausea, vomiting, abdominal pain, abnormal urostomy output, cough, shortness of breath, chest pain. States that he has diarrhea every 3 days, and this is unchanged. Reports chronic low back pain. Past Medical History:  Diagnosis Date  . Bladder cancer ([place])   . Chronic kidney disease   . H/O sinus tachycardia   . Hypertension   . Prediabetes   . Secondary hyperparathyroidism [facility])     Patient Active Problem List   Diagnosis Date Noted  . UTI (lower urinary tract infection) 06/19/2016  . Sepsis ([place]) 06/19/2016  . Hyperglycemia 06/19/2016  . Hypertension 06/19/2016  . AKI (acute kidney injury) ([place]) 06/19/2016  . CKD (chronic kidney disease), stage V ([place]) 06/19/2016  . End stage renal disease ([place]) 03/22/2012    Past Surgical History:  Procedure Laterality Date  . ARTERIOVENOUS GRAFT PLACEMENT     left arm AVG  . AV FISTULA PLACEMENT  03/30/2012   Procedure: ARTERIOVENOUS (AV) FISTULA CREATION;  Surgeon: Angelia Mould, MD;  Location: [facility];  Service: Vascular;  Laterality: Left;  . CYSTECTOMY     w/ ileal diversion for bladder cancer   . TONSILLECTOMY         Home Medications    Prior to Admission medications   Medication Sig Start Date End Date  Taking? Authorizing Provider  calcium carbonate (TUMS EX) 750 MG chewable tablet Chew 4 tablets by mouth daily.    Yes Historical Provider, MD  dorzolamide (TRUSOPT) 2 % ophthalmic solution Place 2 drops into the right eye daily.   Yes Historical Provider, MD  furosemide (LASIX) 40 MG tablet Take 40 mg by mouth daily.   Yes Historical Provider, MD  latanoprost (XALATAN) 0.005 % ophthalmic solution Place 1 drop into both eyes daily. Takes at 5 PM   Yes Historical Provider, MD  metoprolol (LOPRESSOR) 50 MG tablet Take 50 mg by mouth 2 (two) times daily.   Yes Historical Provider, MD  OVER THE COUNTER MEDICATION Take 1 tablet by mouth daily. Iron 65mg    Yes Historical Provider, MD  sodium bicarbonate 650 MG tablet Take 1,300 mg by mouth daily.    Yes Historical Provider, MD    Family History Family History  Problem Relation Age of Onset  . Cancer Mother     BRAIN    Social History Social History  Substance Use Topics  . Smoking status: Former Smoker    Packs/day: 2.00    Years: 40.00    Types: Cigarettes, Pipe    Quit date: 03/22/2006  . Smokeless tobacco: Never Used     Comment: Stopped smoking 10 years ago.  . Alcohol use 19.2 oz/week    7 Cans of beer, 25 Glasses of wine per week     Allergies   Review of patient's allergies indicates  no known allergies.   Review of Systems Review of Systems 10/14 systems reviewed and are negative other than those stated in the HPI   Physical Exam Updated Vital Signs BP (!) 125/50 (BP Location: Right Arm)   Pulse 80   Temp 98.8 F (37.1 C) (Oral)   Resp 18   Ht 5\' 10"  (1.778 m)   Wt 190 lb 8 oz (86.4 kg)   SpO2 98%   BMI 27.33 kg/m   Physical Exam Physical Exam  Nursing note and vitals reviewed. Constitutional: Well developed, well nourished, non-toxic, and in no acute distress Head: Normocephalic and atraumatic.  Mouth/Throat: Oropharynx is clear and moist.  Neck: Normal range of motion. Neck supple.  Cardiovascular:  Tachycardic rate and regular rhythm.  No edema Pulmonary/Chest: Effort normal and breath sounds normal.  Abdominal: Soft. There is no tenderness. There is no rebound and no guarding.  urostomy in the right lower quadrant draining yellow urine.  Musculoskeletal: Normal range of motion.  Neurological: Alert, no facial droop, fluent speech, moves all extremities symmetrically Skin: Skin is warm and dry.  Psychiatric: Cooperative   ED Treatments / Results  Labs (all labs ordered are listed, but only abnormal results are displayed) Labs Reviewed  BLOOD CULTURE ID PANEL (REFLEXED) - Abnormal; Notable for the following:       Result Value   Enterobacteriaceae species DETECTED (*)    Klebsiella oxytoca DETECTED (*)    All other components within normal limits  COMPREHENSIVE METABOLIC PANEL - Abnormal; Notable for the following:    CO2 16 (*)    Glucose, Bld 155 (*)    BUN 110 (*)    Creatinine, Ser 9.11 (*)    Albumin 3.3 (*)    AST 12 (*)    ALT 11 (*)    GFR calc non Af Amer 5 (*)    GFR calc Af Amer 6 (*)    All other components within normal limits  CBC WITH DIFFERENTIAL/PLATELET - Abnormal; Notable for the following:    RBC 2.92 (*)    Hemoglobin 9.2 (*)    HCT 28.8 (*)    Neutro Abs 8.2 (*)    Lymphs Abs 0.1 (*)    All other components within normal limits  URINALYSIS, ROUTINE W REFLEX MICROSCOPIC (NOT AT [facility]) - Abnormal; Notable for the following:    APPearance HAZY (*)    Hgb urine dipstick MODERATE (*)    Protein, ur 100 (*)    Leukocytes, UA MODERATE (*)    All other components within normal limits  URINE MICROSCOPIC-ADD ON - Abnormal; Notable for the following:    Squamous Epithelial / LPF 0-5 (*)    Bacteria, UA MANY (*)    All other components within normal limits  BASIC METABOLIC PANEL - Abnormal; Notable for the following:    CO2 18 (*)    Glucose, Bld 156 (*)    BUN 101 (*)    Creatinine, Ser 8.62 (*)    Calcium 8.5 (*)    GFR calc non Af Amer 5 (*)     GFR calc Af Amer 6 (*)    All other components within normal limits  I-STAT CG4 LACTIC ACID, ED - Abnormal; Notable for the following:    Lactic Acid, Venous 2.27 (*)    All other components within normal limits  CBG MONITORING, ED - Abnormal; Notable for the following:    Glucose-Capillary 149 (*)    All other components within normal limits  CULTURE,  BLOOD (ROUTINE X 2)  CULTURE, BLOOD (ROUTINE X 2)  URINE CULTURE  LACTIC ACID, PLASMA  PROCALCITONIN  PROTIME-INR  APTT  LACTIC ACID, PLASMA  HEMOGLOBIN 123XX123  BASIC METABOLIC PANEL  CBC  I-STAT CG4 LACTIC ACID, ED  I-STAT CG4 LACTIC ACID, ED    EKG  EKG Interpretation  Date/Time:  Saturday June 19 2016 09:20:11 EDT Ventricular Rate:  126 PR Interval:    QRS Duration: 134 QT Interval:  342 QTC Calculation: 496 R Axis:   5 Text Interpretation:  Sinus tachycardia Right bundle branch block other than tachycardia, no significant change  Confirmed by Jasper Ruminski MD, Neasia Fleeman [id number]) on 06/19/2016 10:41:36 AM       Radiology Dg Chest 2 View  Result Date: 06/19/2016 CLINICAL DATA:  Sepsis EXAM: CHEST  2 VIEW COMPARISON:  None. FINDINGS: The heart size appears normal. There is no pleural effusion or edema. Aortic atherosclerosis noted. No airspace consolidation. IMPRESSION: 1. Aortic atherosclerosis. 2. No acute findings. Electronically Signed   By: Kerby Moors M.D.   On: 06/19/2016 11:31   US Renal  Result Date: 06/19/2016 CLINICAL DATA:  Acute on chronic renal disease. Post cystectomy with ileal diversion. History of bladder cancer. EXAM: RENAL / URINARY TRACT ULTRASOUND COMPLETE COMPARISON:  CT abdomen pelvis 07/21/2012 FINDINGS: Right Kidney: Length: 11.8. There is increased echogenicity of the renal parenchyma. Shadowing calculi are seen in the mid and lower renal pole, the largest measuring 9 mm. No evidence of hydronephrosis. Left Kidney: Length: 10.9 cm. Increased cortical echogenicity. There is a moderate to marked left  hydronephrosis. Bladder: Surgically absent. IMPRESSION: Moderate to marked left hydronephrosis. Increased echogenicity of bilateral renal parenchyma, consistent with medical renal disease. Mid and lower pole nonobstructive right nephrolithiasis. Electronically Signed   By: Fidela Salisbury M.D.   On: 06/19/2016 17:10    Procedures Procedures (including critical care time)  Medications Ordered in ED Medications  dorzolamide (TRUSOPT) 2 % ophthalmic solution 2 drop (not administered)  calcium carbonate (TUMS - dosed in mg elemental calcium) chewable tablet 800 mg of elemental calcium (800 mg of elemental calcium Oral Given 06/19/16 1852)  metoprolol tartrate (LOPRESSOR) tablet 50 mg (50 mg Oral Given 06/19/16 2137)  sodium bicarbonate tablet 1,300 mg (1,300 mg Oral Given 06/19/16 1851)  latanoprost (XALATAN) 0.005 % ophthalmic solution 1 drop (1 drop Both Eyes Given 06/19/16 2137)  0.9 %  sodium chloride infusion ( Intravenous New Bag/Given 06/19/16 1741)  acetaminophen (TYLENOL) tablet 650 mg (not administered)    Or  acetaminophen (TYLENOL) suppository 650 mg (not administered)  traZODone (DESYREL) tablet 25 mg (not administered)  polyethylene glycol (MIRALAX / GLYCOLAX) packet 17 g (not administered)  bisacodyl (DULCOLAX) EC tablet 5 mg (not administered)  ondansetron (ZOFRAN) tablet 4 mg (not administered)    Or  ondansetron (ZOFRAN) injection 4 mg (not administered)  heparin injection 5,000 Units (5,000 Units Subcutaneous Not Given 06/20/16 0600)  cefTRIAXone (ROCEPHIN) 2 g in dextrose 5 % 50 mL IVPB (not administered)  sodium chloride 0.9 % bolus 1,000 mL (0 mLs Intravenous Stopped 06/19/16 1153)    And  sodium chloride 0.9 % bolus 1,000 mL (0 mLs Intravenous Stopped 06/19/16 1153)    And  sodium chloride 0.9 % bolus 500 mL (0 mLs Intravenous Stopped 06/19/16 1230)  acetaminophen (TYLENOL) tablet 1,000 mg (1,000 mg Oral Given 06/19/16 1011)  cefTRIAXone (ROCEPHIN) 2 g in dextrose 5 % 50  mL IVPB (0 g Intravenous Stopped 06/19/16 1153)     Initial Impression / Assessment  and Plan / ED Course  I have reviewed the triage vital signs and the nursing notes.  Pertinent labs & imaging results that were available during my care of the patient were reviewed by me and considered in my medical decision making (see chart for details).  Clinical Course    Code sepsis activated on arrival. Patient tachycardic, tachypnea, and with fever. Source likely urine due to his urostomy and UA with many leukocytes and too numerous to count WBCs.  Urine culture, blood cultures are also obtained. Blood work also notable for elevated lactic 2.27. No significant leukocytosis. With acute renal failure, creatinine of 9.11. His baseline is usually around a creatinine of 5. Normal potassium. No evidence of fluid overload on exam. CXR w/o PNA. He did receive 2 g of ceftriaxone for UTI, and IV fluid resuscitation 30 mL/kg. Will admit to medicine service for ongoing management.    CRITICAL CARE Performed by: Forde Dandy   Total critical care time: 35 minutes  Critical care time was exclusive of separately billable procedures and treating other patients.  Critical care was necessary to treat or prevent imminent or life-threatening deterioration.  Critical care was time spent personally by me on the following activities: development of treatment plan with patient and/or surrogate as well as nursing, discussions with consultants, evaluation of patient's response to treatment, examination of patient, obtaining history from patient or surrogate, ordering and performing treatments and interventions, ordering and review of laboratory studies, ordering and review of radiographic studies, pulse oximetry and re-evaluation of patient's condition.    Final Clinical Impressions(s) / ED Diagnoses   Final diagnoses:  Weakness  UTI (lower urinary tract infection)  Sepsis, due to unspecified organism ([place])  AKI  (acute kidney injury) [facility])    New Prescriptions Current Discharge Medication List       Forde Dandy, MD 06/20/16 [phone]

## 2016-06-19 NOTE — Progress Notes (Signed)
Pharmacy Antibiotic Note  Matthew Mays is a 75 y.o. male admitted on 06/19/2016 with sepsis 2/2 UTI.  PMH includes bladder cx s/p urethrectomy w/ urostomy. Pt reports feeling unwell with fevers and chills. Tmax 103.5 in ED, LA 2.27 - code sepsis called. Pharmacy has been consulted for ceftriaxone dosing.  Plan: -Ceftriaxone 2g x1 in ED -Ceftriaxone 1 g q24h  Height: 5\' 10"  (177.8 cm) Weight: 180 lb (81.6 kg) IBW/kg (Calculated) : 73  Temp (24hrs), Avg:101.8 F (38.8 C), Min:100.3 F (37.9 C), Max:103.2 F (39.6 C)   Recent Labs Lab 06/19/16 0936 06/19/16 0947 06/19/16 1352  WBC 8.4  --   --   CREATININE 9.11*  --   --   LATICACIDVEN  --  2.27* 1.86    Estimated Creatinine Clearance: 7.2 mL/min (by C-G formula based on SCr of 9.11 mg/dL).    No Known Allergies  Antimicrobials this admission: 8/26 CTX >>   Dose adjustments this admission: n/a  Microbiology results: 8/26 BCx: IP 8/26 UCx: IP    Thank you for allowing pharmacy to be a part of this patient's care.  Arrie Senate, PharmD PGY-1 Pharmacy Resident Pager: [phone] 06/19/2016

## 2016-06-19 NOTE — Consult Note (Signed)
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 year-old with hx of HTN, CKD stage 4/5 , sec HPTH and bladder cancer w ileal diversion, DM2.   Pateint presented to ED with gen weakness, couldn't stand w/o help. In ED was tachy with creat 9, up from baseline ~ 5.  UA showed tntc WBC's and pt was febrile to 103.  Admitted and started on IVF at 50/hr and IV abx.  Creat today is down to 8.62.  BP is normal,  140's/80's.  No N/V/ occ diarrhea (chronic, intermitt), no abd pain.    Pt f/b Dr Florene Glen, says his baseline creat is 7.  No wt loss or n/v at home, no fatigue prior to this acute illness.  Remotes hx UTI's had 2-3 about 6-7 yrs ago, but none lately.  No nsaids.  Feels a lot better today after IVF's and IV abx overnight.   He had bad case of bladder cancer yrs ago and they took his bladder out, has not had any problems with recurrence.  He has an ileal conduit (urostomy) in the RLQ.   Pt grew up in [place] outside of [place].  Got an assoc degree in Engineer, production and works Orthoptist for [employer], e.g.textile and heavy machine for [employer]  Pt is widowed, has 2 children left.  Lives in [place] on a 5 acre lot.  Struggles w anemia for several yrs related to CKD, took ESA for a while but gets by w/o it now and Hb still around 10 baseline.  LUA AV fistula failed, has a 75 yr old L forearm AVF never used.        ROS  denies CP  no joint pain   no HA  no blurry vision  no rash  no diarrhea  no nausea/ vomiting  no dysuria  no difficulty voiding  no change in urine color    Past Medical History  Past Medical History:  Diagnosis Date  . Bladder cancer ([place])   . Chronic kidney disease   . H/O sinus tachycardia   . Hypertension   . Prediabetes   . Secondary hyperparathyroidism [facility])     Past Surgical History  Past Surgical History:  Procedure Laterality Date  . ARTERIOVENOUS GRAFT PLACEMENT     left arm AVG  . AV FISTULA PLACEMENT  03/30/2012   Procedure: ARTERIOVENOUS (AV) FISTULA CREATION;  Surgeon: Angelia Mould, MD;  Location: [facility];  Service: Vascular;  Laterality: Left;  . CYSTECTOMY     w/ ileal diversion for bladder cancer   . TONSILLECTOMY     Family History  Family History  Problem Relation Age of Onset  . Cancer Mother     BRAIN   Social History  reports that he quit smoking about 10 years ago. His smoking use included Cigarettes and Pipe. He has a 80.00 pack-year smoking history. He has never used smokeless tobacco. He reports that he drinks about 19.2 oz of alcohol per week . He reports that he does not use drugs. Allergies No Known Allergies Home medications Prior to Admission medications   Medication Sig Start Date End Date Taking? Authorizing Provider  calcium carbonate (TUMS EX) 750 MG chewable tablet Chew 4 tablets by mouth daily.    Yes Historical Provider, MD  dorzolamide (TRUSOPT) 2 % ophthalmic solution Place 2  drops into the right eye daily.   Yes Historical Provider, MD  furosemide (LASIX) 40 MG tablet Take 40 mg by mouth daily.   Yes Historical Provider, MD  latanoprost (XALATAN) 0.005 % ophthalmic solution Place 1 drop into both eyes daily. Takes at 5 PM   Yes Historical Provider, MD  metoprolol (LOPRESSOR) 50 MG tablet Take 50 mg by mouth 2 (two) times daily.   Yes Historical Provider, MD  OVER THE COUNTER MEDICATION Take 1 tablet by mouth daily. Iron 65mg    Yes Historical Provider, MD  sodium bicarbonate 650 MG tablet Take 1,300 mg by mouth daily.    Yes Historical Provider, MD   Liver Function Tests  Recent Labs Lab 06/19/16 0936  AST 12*  ALT 11*  ALKPHOS 51  BILITOT 0.6  PROT 7.1  ALBUMIN 3.3*   No results for input(s): LIPASE, AMYLASE in the last 168 hours. CBC  Recent Labs Lab 06/19/16 0936  WBC 8.4   NEUTROABS 8.2*  HGB 9.2*  HCT 28.8*  MCV 98.6  PLT [id number]   Basic Metabolic Panel  Recent Labs Lab 06/19/16 0936 06/19/16 1611  NA 138 140  K 4.7 4.3  CL 108 111  CO2 16* 18*  GLUCOSE 155* 156*  BUN 110* 101*  CREATININE 9.11* 8.62*  CALCIUM 9.3 8.5*   Iron/TIBC/Ferritin/ %Sat    Component Value Date/Time   IRON 86 07/31/2010 1309   TIBC 280 07/31/2010 1309   FERRITIN 650 (H) 07/31/2010 1309   IRONPCTSAT 31 07/31/2010 1309    Vitals:   06/19/16 1445 06/19/16 1500 06/19/16 1515 06/19/16 1618  BP: 138/60 135/56 135/60 (!) 131/53  Pulse: 88 86 86 79  Resp:      Temp:      TempSrc:      SpO2: 100% 100% 100% 100%  Weight:    86.4 kg (190 lb 8 oz)  Height:    5\' 10"  (1.778 m)   Exam Gen chron ill, pleasant No rash, cyanosis or gangrene Sclera anicteric, throat clear  No jvd or bruits Chest clear bilat RRR no MRG Abd soft ntnd no mass or ascites +bs, RLQ urostomy bag w clear urine GU normal male MS no joint effusions or deformity Ext no LE or UE edema / no wounds or ulcers Neuro is alert, Ox 3 , nf, no asterixis L RC AVF not real well developed, +bruit  Home meds > Lasix 40/d, Lopressor 50 bid, eye drops, NaHCO3 tabs, CaCO3  Renal US > R kidney length: 11.8, no hydronephrosis.  Left Kidney: length: 10.9 cm. Increased cortical echogenicity... there is a moderate to marked left hydronephrosis.  Assessment: 1.  Acute on CRF - due to vol depletion/UTI and/or L hydronephrosis.  Not sure if this is new or old (the hydro).  2.  UTI - on Rocephin 3.  Vol depletion - getting IVF"s.  4.  CKD 4/5 - baseline creat 7, f/b Dr Florene Glen, has AVF L forearm which is not well developed 5.  Hx bladder Ca/ R ileal conduit   Plan - no indication for dialysis.  Has room for volume, will increase IVF's slightly.  Need to get OP records to see if the L hydro has been addressed or not in the past.  Suspect he will improve back to baseline Cr around 7.  He is not interested in doing  dialysis unless he is very sick.  Will follow.   Kelly Splinter MD [employer] pager [phone]    cell [phone]  06/19/2016, 6:57 PM

## 2016-06-19 NOTE — ED Notes (Signed)
Called radiology to check on pt's status of going for xray. Pt is next in line.

## 2016-06-19 NOTE — ED Notes (Signed)
Attempted report x1. 

## 2016-06-19 NOTE — ED Notes (Addendum)
Gave pt turkey sandwich and water 

## 2016-06-19 NOTE — ED Triage Notes (Signed)
Pt called EMS for help off the toilet. Pt feeling weak. EMS reports pt could not stand without help and usually is ambulatory. Pt HR 120's ST, BP 127/75, CBG 172, 95% on room air. Pt is alert and oriented; lives alone. Pt states he started feeling weak on Tues and has progressively gotten worse.

## 2016-06-19 NOTE — H&P (Signed)
History and Physical    Matthew Mays [id number] DOB: 1941/05/12 DOA: 06/19/2016  PCP: Estanislado Emms, MD  Patient coming from:   Home   Chief Complaint: weakness and chills  HPI: Matthew Mays is a 75 y.o. male with a medical history significant for, but not  limited to, CKD, HTN , DM2 and bladder cancer. He called EMS today after feeling too weak to get himself off the toilet.Weakness started approx 4 days ago, has gotten progressively worse.  No chest pain, sob or worsening of his chronic cough. No abdominal pain.   ED Course:  Temp 103.2,, tachycardic, tachypneic  2.5 L  Fluid bolus, tylenol, 2 grams rocephin  Review of Systems: As per HPI, otherwise 10 point review of systems negative.    Past Medical History:  Diagnosis Date  . Bladder cancer ([place])   . Chronic kidney disease   . H/O sinus tachycardia   . Hypertension   . Prediabetes   . Secondary hyperparathyroidism [facility])     Past Surgical History:  Procedure Laterality Date  . ARTERIOVENOUS GRAFT PLACEMENT     left arm AVG  . AV FISTULA PLACEMENT  03/30/2012   Procedure: ARTERIOVENOUS (AV) FISTULA CREATION;  Surgeon: Angelia Mould, MD;  Location: [place];  Service: Vascular;  Laterality: Left;  . CYSTECTOMY     w/ ileal diversion for bladder cancer   . TONSILLECTOMY      Social History   Social History  . Marital status: Married    Spouse name: N/A  . Number of children: N/A  . Years of education: N/A   Occupational History  . Not on file.   Social History Main Topics  . Smoking status: Former Smoker    Packs/day: 2.00    Years: 40.00    Types: Cigarettes    Quit date: 03/22/2006  . Smokeless tobacco: Never Used  . Alcohol use 6.0 oz/week    10 Cans of beer per week  . Drug use: No  . Sexual activity: Not on file   Other Topics Concern  . Not on file   Social History Narrative  . No narrative on file   Lives at home alone. No assistive devices needed for ambulation No Known  Allergies  Family History  Problem Relation Age of Onset  . Cancer Mother     BRAIN    Prior to Admission medications   Medication Sig Start Date End Date Taking? Authorizing Provider  calcium carbonate (TUMS EX) 750 MG chewable tablet Chew 4 tablets by mouth daily.    Yes Historical Provider, MD  dorzolamide (TRUSOPT) 2 % ophthalmic solution Place 2 drops into the right eye daily.   Yes Historical Provider, MD  furosemide (LASIX) 40 MG tablet Take 40 mg by mouth daily.   Yes Historical Provider, MD  latanoprost (XALATAN) 0.005 % ophthalmic solution Place 1 drop into both eyes daily. Takes at 5 PM   Yes Historical Provider, MD  metoprolol (LOPRESSOR) 50 MG tablet Take 50 mg by mouth 2 (two) times daily.   Yes Historical Provider, MD  OVER THE COUNTER MEDICATION Take 1 tablet by mouth daily. Iron 65mg    Yes Historical Provider, MD  sodium bicarbonate 650 MG tablet Take 1,300 mg by mouth daily.    Yes Historical Provider, MD    Physical Exam: Vitals:   06/19/16 1134 06/19/16 1135 06/19/16 1145 06/19/16 1245  BP:  143/68 145/64 120/57  Pulse:  101 93 95  Resp:  22 23  25  Temp: 100.3 F (37.9 C)     TempSrc: Rectal     SpO2:  100% 100% 100%  Weight:      Height:        Constitutional:  Pleasant obese white male in NAD, calm, comfortable Vitals:   06/19/16 1134 06/19/16 1135 06/19/16 1145 06/19/16 1245  BP:  143/68 145/64 120/57  Pulse:  101 93 95  Resp:  22 23 25   Temp: 100.3 F (37.9 C)     TempSrc: Rectal     SpO2:  100% 100% 100%  Weight:      Height:       Eyes: PER, lids and conjunctivae normal ENMT: Mucous membranes are moist. Posterior pharynx clear of any exudate or lesions..  Neck: normal, supple, no masses Respiratory: clear to auscultation bilaterally, no wheezing, no crackles. Normal respiratory effort. No accessory muscle use.  Cardiovascular:  Sinus tachycardia. No extremity edema. 1+ dorsal pedis pulses.   Abdomen: no tenderness, no masses palpated. No  hepatomegaly. Bowel sounds positive. RLQ ileostomy bag with large amount of clear, light yellow urine.  Musculoskeletal: no clubbing / cyanosis. No joint deformity upper and lower extremities. Good ROM, no contractures. Normal muscle tone.  Skin: no rashes, lesions, ulcers.  Neurologic: CN 2-12 grossly intact. Sensation intact, Strength 5/5 in all 4.  Psychiatric: Normal judgment and insight. Alert and oriented x 3. Normal mood.   Labs on Admission: I have personally reviewed following labs and imaging studies  Urine analysis:    Component Value Date/Time   COLORURINE YELLOW 06/19/2016 0936   APPEARANCEUR HAZY (A) 06/19/2016 0936   LABSPEC 1.014 06/19/2016 0936   PHURINE 6.5 06/19/2016 0936   GLUCOSEU NEGATIVE 06/19/2016 0936   HGBUR MODERATE (A) 06/19/2016 0936   BILIRUBINUR NEGATIVE 06/19/2016 0936   KETONESUR NEGATIVE 06/19/2016 0936   PROTEINUR 100 (A) 06/19/2016 0936   NITRITE NEGATIVE 06/19/2016 0936   LEUKOCYTESUR MODERATE (A) 06/19/2016 0936    Radiological Exams on Admission: Dg Chest 2 View  Result Date: 06/19/2016 CLINICAL DATA:  Sepsis EXAM: CHEST  2 VIEW COMPARISON:  None. FINDINGS: The heart size appears normal. There is no pleural effusion or edema. Aortic atherosclerosis noted. No airspace consolidation. IMPRESSION: 1. Aortic atherosclerosis. 2. No acute findings. Electronically Signed   By: Kerby Moors M.D.   On: 06/19/2016 11:31    EKG: Independently reviewed.   EKG Interpretation  Date/Time:  Saturday June 19 2016 09:20:11 EDT Ventricular Rate:  126 PR Interval:    QRS Duration: 134 QT Interval:  342 QTC Calculation: 496 R Axis:   5 Text Interpretation:  Sinus tachycardia Right bundle branch block other than tachycardia, no significant change  Confirmed by LIU MD, Hinton Dyer [id number]) on 06/19/2016 10:41:36 AM        Assessment/Plan   Active Problems:   UTI (lower urinary tract infection)   Sepsis ([place])   Hyperglycemia   Hypertension   AKI (acute  kidney injury) ([place])   CKD (chronic kidney disease), stage V ([place])      UTI Sepsis .            -admit to Medical bed -Sepsis order set utilized -Fluid resucitation completed in ED ( 2.5 lL). Continue maintenance fluids x 12 hours -await blood and urine cultures  AKI superimposed on CKD in setting of UTI / sepsis. Baseline Cr mid 5 range, currently 9. Urine output at baseline.  -hold home lasix -obtain renal ultrasound -repeat BMET this afternoon to see if improvement  after fluid resuscitation  Hypertension, controlled . --continue home lopressor  Hyperglycemia. History of "prediabetes"  -A1c -carb modified diet  Hx of bladder cancer, s/p cystectomy with ileal diversion.   DVT prophylaxis:     SQ Heparin  Code Status:     Full code  Family Communication:  none  Disposition Plan:   Discharge home in 2-3 day             Consults called:  Nephrology -Dr. Jonnie Finner Admission status:   Admission -  Medical bed   Tye Savoy NP [facility] Pager [phone]  If 7PM-7AM, please contact night-coverage [email or web address] Password [email or web address]  06/19/2016, 1:33 PM

## 2016-06-20 LAB — BASIC METABOLIC PANEL
Anion gap: 11 (ref 5–15)
BUN: 104 mg/dL — AB (ref 6–20)
CALCIUM: 7.9 mg/dL — AB (ref 8.9–10.3)
CO2: 17 mmol/L — ABNORMAL LOW (ref 22–32)
Chloride: 111 mmol/L (ref 101–111)
Creatinine, Ser: 7.71 mg/dL — ABNORMAL HIGH (ref 0.61–1.24)
GFR calc Af Amer: 7 mL/min — ABNORMAL LOW (ref >60)
GFR, EST NON AFRICAN AMERICAN: 6 mL/min — AB (ref >60)
GLUCOSE: 106 mg/dL — AB (ref 65–99)
POTASSIUM: 4.6 mmol/L (ref 3.5–5.1)
Sodium: 139 mmol/L (ref 135–145)

## 2016-06-20 LAB — HEMOGLOBIN A1C
Hgb A1c MFr Bld: 5.5 % (ref 4.8–5.6)
Mean Plasma Glucose: 111 mg/dL

## 2016-06-20 LAB — CBC
HCT: 25.8 % — ABNORMAL LOW (ref 39.0–52.0)
HEMATOCRIT: 22.5 % — AB (ref 39.0–52.0)
Hemoglobin: 6.9 g/dL — CL (ref 13.0–17.0)
Hemoglobin: 7.9 g/dL — ABNORMAL LOW (ref 13.0–17.0)
MCH: 31.4 pg (ref 26.0–34.0)
MCH: 31.3 pg (ref 26.0–34.0)
MCHC: 30.7 g/dL (ref 30.0–36.0)
MCHC: 30.6 g/dL (ref 30.0–36.0)
MCV: 102.3 fL — ABNORMAL HIGH (ref 78.0–100.0)
MCV: 102.4 fL — ABNORMAL HIGH (ref 78.0–100.0)
PLATELETS: 126 10*3/uL — AB (ref 150–400)
Platelets: 135 10*3/uL — ABNORMAL LOW (ref 150–400)
RBC: 2.2 MIL/uL — ABNORMAL LOW (ref 4.22–5.81)
RBC: 2.52 MIL/uL — AB (ref 4.22–5.81)
RDW: 15.3 % (ref 11.5–15.5)
RDW: 15.4 % (ref 11.5–15.5)
WBC: 11 10*3/uL — ABNORMAL HIGH (ref 4.0–10.5)
WBC: 10.4 10*3/uL (ref 4.0–10.5)

## 2016-06-20 LAB — BLOOD CULTURE ID PANEL (REFLEXED)
Acinetobacter baumannii: NOT DETECTED
CANDIDA GLABRATA: NOT DETECTED
CANDIDA KRUSEI: NOT DETECTED
CANDIDA PARAPSILOSIS: NOT DETECTED
CANDIDA TROPICALIS: NOT DETECTED
Candida albicans: NOT DETECTED
Carbapenem resistance: NOT DETECTED
ESCHERICHIA COLI: NOT DETECTED
Enterobacter cloacae complex: NOT DETECTED
Enterobacteriaceae species: DETECTED — AB
Enterococcus species: NOT DETECTED
HAEMOPHILUS INFLUENZAE: NOT DETECTED
Klebsiella oxytoca: DETECTED — AB
Klebsiella pneumoniae: NOT DETECTED
LISTERIA MONOCYTOGENES: NOT DETECTED
METHICILLIN RESISTANCE: NOT DETECTED
Neisseria meningitidis: NOT DETECTED
PROTEUS SPECIES: NOT DETECTED
Pseudomonas aeruginosa: NOT DETECTED
SERRATIA MARCESCENS: NOT DETECTED
STREPTOCOCCUS PYOGENES: NOT DETECTED
Staphylococcus aureus (BCID): NOT DETECTED
Staphylococcus species: NOT DETECTED
Streptococcus agalactiae: NOT DETECTED
Streptococcus pneumoniae: NOT DETECTED
Streptococcus species: NOT DETECTED
Vancomycin resistance: NOT DETECTED

## 2016-06-20 LAB — TYPE AND SCREEN
ABO/RH(D): A POS
ANTIBODY SCREEN: NEGATIVE

## 2016-06-20 LAB — GLUCOSE, CAPILLARY: GLUCOSE-CAPILLARY: 170 mg/dL — AB (ref 65–99)

## 2016-06-20 MED ORDER — SODIUM CHLORIDE 0.9 % IV SOLN: Freq: Once | INTRAVENOUS | Status: DC

## 2016-06-20 MED ORDER — CEFTRIAXONE SODIUM 2 G IJ SOLR
2.0000 g | Freq: Every day | INTRAMUSCULAR | Status: DC
  Administered 2016-06-20: 2 g via INTRAVENOUS
  Filled 2016-06-20 (×2): qty 2

## 2016-06-20 NOTE — Progress Notes (Signed)
PROGRESS NOTE    Matthew Mays Evelyn  [id number] DOB: [birth year] DOA: 06/19/2016 PCP: Estanislado Emms, MD   Outpatient Specialists: Dr. Florene Glen, nephrology   Brief Narrative: Pleasant 75 y.o. year-old with hx of HTN, CKD stage 4/5 with access but not on HD, bladder cancer w ileal diversion, and T2DM. He presented to ED on 8/25 with generalized weakness, couldn't stand w/o help. In ED was febrile, tachycardic with AKI on CKD, Cr 9 up from baseline ~ 5. UA showed TNTC WBC's, consistent with UTI. Admitted with gentle IVF's and CTX. Rapid blood culture grew Klebsiella oxytoca and Enterobacter spp, though he has improved clinically. Lactate cleared, afebrile, and regaining strength. ID sidelined and recommends continuing current abx and awaiting sensitivities. On 8/27, hgb dropped precipitously with no evidence of bleeding, and recheck CBC is pending. Also awaiting PT consult, though weakness has improved, he is still newly requiring a walker for ambulation.   Assessment & Plan:   Active Problems:   UTI (lower urinary tract infection)   Sepsis ([facility])   Hyperglycemia   Hypertension   AKI (acute kidney injury) ([place])   CKD (chronic kidney disease), stage V ([place])  Sepsis due to complicated UTI: Vital signs have stabilized, lactate cleared with initiation of IV abx and gentle fluids. PCT elevated due to CKD. Blood Cx with GNR's and rapid ID panel grew K. oxytoca and Enterobacter spp. Though colonization is likely for pt s/p cystectomy with ileal diversion, +cultures would not be expected.  - ID, Dr. Johnnye Sima, sidelined, will await sensitivities and consult formally as needed - Continuing IVF's  Acute on chronic anemia: Without signs/symptoms of bleeding hgb dropped 9.2 > 6.9 in < 24 hours on 8/27 AM. Does not make sense with improving weakness and resolving sepsis.  - Recheck to verify - Transfusion of 1u PRBCs ordered, pending recheck.  - If true acute drop, consider CT abd/pelvis  AKI superimposed on  CKD: Cr slowly improving with IVF's. Renal U/S shows heft hydronephrosis of unknown duration and increased echogenicity as expected. No HD indications.  - hold home lasix - Continue to monitor - Nephrology input appreciated  Hypertension, controlled . - continue home lopressor  Hyperglycemia. History of "prediabetes"  - A1c pending - carb modified diet  DVT prophylaxis: Heparin Code Status: Full Family Communication: Daughter, Di Kindle, arriving from [place] later this morning, will discuss with her then. Disposition Plan: Expect discharge to home pending PT eval, in next 24 - 48 hours pending culture results, anemia.   Consultants:   Nephrology, Dr. Jonnie Finner   Procedures:  Renal U/S:  Right Kidney: Length: 11.8. There is increased echogenicity of the renal parenchyma. Shadowing calculi are seen in the mid and lower renal pole, the largest measuring 9 mm. No evidence of hydronephrosis.  Left Kidney: Length: 10.9 cm. Increased cortical echogenicity. There is a moderate to marked left hydronephrosis.  Bladder: Surgically absent.  IMPRESSION: - Moderate to marked left hydronephrosis. - Increased echogenicity of bilateral renal parenchyma, consistent with medical renal disease. - Mid and lower pole nonobstructive right nephrolithiasis.  Antimicrobials:   CTX 8/26 >>    Subjective: Pt reports improvement in weakness, ambulated to bathroom with RW. Had dark BM (typical for him with Fe pills) without red blood. No abd pain, N/V, unusual bleeding/pruising, palpitations, dyspnea, chest pain.   Objective: Vitals:   06/19/16 1618 06/19/16 2134 06/19/16 2203 06/20/16 0548  BP: (!) 131/53 (!) 130/58 (!) 124/51 (!) 125/50  Pulse: 79 78 68 80  Resp:   18  18  Temp:   97.5 F (36.4 C) 98.8 F (37.1 C)  TempSrc:   Oral Oral  SpO2: 100%  99% 98%  Weight: 86.4 kg (190 lb 8 oz)     Height: 5\' 10"  (1.778 m)       Intake/Output Summary (Last 24 hours) at 06/20/16 0758 Last  data filed at 06/20/16 0400  Gross per 24 hour  Intake          2965.83 ml  Output              400 ml  Net          2565.83 ml   Filed Weights   06/19/16 0918 06/19/16 1618  Weight: 81.6 kg (180 lb) 86.4 kg (190 lb 8 oz)   Examination:  General exam: Appears calm and comfortable  Respiratory system: Clear to auscultation. Respiratory effort normal. Cardiovascular system: S1 & S2 heard, RRR. No JVD, murmurs, rubs, gallops or clicks. Trace pedal edema.  Gastrointestinal system: Abdomen is nondistended, soft and nontender, RLQ ileostomy with clear urine, no surrounding erythema. No organomegaly or masses felt. Normal bowel sounds heard. Central nervous system: Alert and oriented. No focal neurological deficits. Extremities: No deformities Skin: No rashes, lesions or ulcers. No wounds Psychiatry: Judgement and insight appear normal. Mood & affect appropriate.   Data Reviewed: I have personally reviewed following labs and imaging studies  CBC:  Recent Labs Lab 06/19/16 0936 06/20/16 0536  WBC 8.4 11.0*  NEUTROABS 8.2*  --   HGB 9.2* 6.9*  HCT 28.8* 22.5*  MCV 98.6 102.3*  PLT 174 [id number]*   Basic Metabolic Panel:  Recent Labs Lab 06/19/16 0936 06/19/16 1611 06/20/16 0536  NA 138 140 139  K 4.7 4.3 4.6  CL 108 111 111  CO2 16* 18* 17*  GLUCOSE 155* 156* 106*  BUN 110* 101* 104*  CREATININE 9.11* 8.62* 7.71*  CALCIUM 9.3 8.5* 7.9*   GFR: Estimated Creatinine Clearance: 8.5 mL/min (by C-G formula based on SCr of 7.71 mg/dL). Liver Function Tests:  Recent Labs Lab 06/19/16 0936  AST 12*  ALT 11*  ALKPHOS 51  BILITOT 0.6  PROT 7.1  ALBUMIN 3.3*   No results for input(s): LIPASE, AMYLASE in the last 168 hours. No results for input(s): AMMONIA in the last 168 hours. Coagulation Profile:  Recent Labs Lab 06/19/16 1611  INR 1.19   Cardiac Enzymes: No results for input(s): CKTOTAL, CKMB, CKMBINDEX, TROPONINI in the last 168 hours. BNP (last 3 results) No  results for input(s): PROBNP in the last 8760 hours. HbA1C: No results for input(s): HGBA1C in the last 72 hours. CBG:  Recent Labs Lab 06/19/16 0941  GLUCAP 149*   Lipid Profile: No results for input(s): CHOL, HDL, LDLCALC, TRIG, CHOLHDL, LDLDIRECT in the last 72 hours. Thyroid Function Tests: No results for input(s): TSH, T4TOTAL, FREET4, T3FREE, THYROIDAB in the last 72 hours. Anemia Panel: No results for input(s): VITAMINB12, FOLATE, FERRITIN, TIBC, IRON, RETICCTPCT in the last 72 hours. Urine analysis:    Component Value Date/Time   COLORURINE YELLOW 06/19/2016 0936   APPEARANCEUR HAZY (A) 06/19/2016 0936   LABSPEC 1.014 06/19/2016 0936   PHURINE 6.5 06/19/2016 0936   GLUCOSEU NEGATIVE 06/19/2016 0936   HGBUR MODERATE (A) 06/19/2016 0936   BILIRUBINUR NEGATIVE 06/19/2016 0936   KETONESUR NEGATIVE 06/19/2016 0936   PROTEINUR 100 (A) 06/19/2016 0936   NITRITE NEGATIVE 06/19/2016 0936   LEUKOCYTESUR MODERATE (A) 06/19/2016 0936   Sepsis Labs: @LABRCNTIP (procalcitonin:4,lacticidven:4)  ) Recent  Results (from the past 240 hour(s))  Culture, blood (Routine x 2)     Status: None (Preliminary result)   Collection Time: 06/19/16  9:34 AM  Result Value Ref Range Status   Specimen Description BLOOD RIGHT HAND  Final   Special Requests BOTTLES DRAWN AEROBIC AND ANAEROBIC 5CC  Final   Culture  Setup Time   Final    GRAM NEGATIVE RODS AEROBIC BOTTLE ONLY Organism ID to follow CRITICAL RESULT CALLED TO, READ BACK BY AND VERIFIED WITH: [email or web address],RN [id number] 06/20/16 G.[email or web address]    Culture PENDING  Incomplete   Report Status PENDING  Incomplete  Blood Culture ID Panel (Reflexed)     Status: Abnormal   Collection Time: 06/19/16  9:34 AM  Result Value Ref Range Status   Enterococcus species NOT DETECTED NOT DETECTED Final   Vancomycin resistance NOT DETECTED NOT DETECTED Final   Listeria monocytogenes NOT DETECTED NOT DETECTED Final   Staphylococcus species NOT DETECTED NOT  DETECTED Final   Staphylococcus aureus NOT DETECTED NOT DETECTED Final   Methicillin resistance NOT DETECTED NOT DETECTED Final   Streptococcus species NOT DETECTED NOT DETECTED Final   Streptococcus agalactiae NOT DETECTED NOT DETECTED Final   Streptococcus pneumoniae NOT DETECTED NOT DETECTED Final   Streptococcus pyogenes NOT DETECTED NOT DETECTED Final   Acinetobacter baumannii NOT DETECTED NOT DETECTED Final   Enterobacteriaceae species DETECTED (A) NOT DETECTED Final    Comment: CRITICAL RESULT CALLED TO, READ BACK BY AND VERIFIED WITH: K.AMEND,PHARMD [id number] 06/20/16 G.[email or web address]    Enterobacter cloacae complex NOT DETECTED NOT DETECTED Final   Escherichia coli NOT DETECTED NOT DETECTED Final   Klebsiella oxytoca DETECTED (A) NOT DETECTED Final    Comment: CRITICAL RESULT CALLED TO, READ BACK BY AND VERIFIED WITH: K.AMEND,PHARMD [id number] 06/20/16 G.MCADOO    Klebsiella pneumoniae NOT DETECTED NOT DETECTED Final   Proteus species NOT DETECTED NOT DETECTED Final   Serratia marcescens NOT DETECTED NOT DETECTED Final   Carbapenem resistance NOT DETECTED NOT DETECTED Final   Haemophilus influenzae NOT DETECTED NOT DETECTED Final   Neisseria meningitidis NOT DETECTED NOT DETECTED Final   Pseudomonas aeruginosa NOT DETECTED NOT DETECTED Final   Candida albicans NOT DETECTED NOT DETECTED Final   Candida glabrata NOT DETECTED NOT DETECTED Final   Candida krusei NOT DETECTED NOT DETECTED Final   Candida parapsilosis NOT DETECTED NOT DETECTED Final   Candida tropicalis NOT DETECTED NOT DETECTED Final         Radiology Studies: Dg Chest 2 View  Result Date: 06/19/2016 CLINICAL DATA:  Sepsis EXAM: CHEST  2 VIEW COMPARISON:  None. FINDINGS: The heart size appears normal. There is no pleural effusion or edema. Aortic atherosclerosis noted. No airspace consolidation. IMPRESSION: 1. Aortic atherosclerosis. 2. No acute findings. Electronically Signed   By: Kerby Moors M.D.   On: 06/19/2016 11:31    US Renal  Result Date: 06/19/2016 CLINICAL DATA:  Acute on chronic renal disease. Post cystectomy with ileal diversion. History of bladder cancer. EXAM: RENAL / URINARY TRACT ULTRASOUND COMPLETE COMPARISON:  CT abdomen pelvis 07/21/2012 FINDINGS: Right Kidney: Length: 11.8. There is increased echogenicity of the renal parenchyma. Shadowing calculi are seen in the mid and lower renal pole, the largest measuring 9 mm. No evidence of hydronephrosis. Left Kidney: Length: 10.9 cm. Increased cortical echogenicity. There is a moderate to marked left hydronephrosis. Bladder: Surgically absent. IMPRESSION: Moderate to marked left hydronephrosis. Increased echogenicity of bilateral renal parenchyma, consistent with medical renal disease. Mid and lower pole nonobstructive right nephrolithiasis.  Electronically Signed   By: Fidela Salisbury M.D.   On: 06/19/2016 17:10        Scheduled Meds: . sodium chloride   Intravenous Once  . calcium carbonate  4 tablet Oral Daily  . cefTRIAXone (ROCEPHIN)  IV  2 g Intravenous Q0600  . dorzolamide  2 drop Right Eye Daily  . heparin  5,000 Units Subcutaneous Q8H  . latanoprost  1 drop Both Eyes QHS  . metoprolol  50 mg Oral BID  . sodium bicarbonate  1,300 mg Oral Daily   Continuous Infusions: . sodium chloride 50 mL/hr at 06/19/16 1741     LOS: 1 day   Time spent: 25 min  Vance Gather, MD [facility] Pager [phone]  If 7PM-7AM, please contact night-coverage [email or web address] Password [facility] 06/20/2016, 7:58 AM

## 2016-06-20 NOTE — Progress Notes (Signed)
Lab called at 0450, blood culture grew negative rods. Spoke with Dr. Maudie Mercury at [phone], VS reported, and blood culture results. MD will place new orders for pt.

## 2016-06-20 NOTE — Progress Notes (Signed)
[facility] Progress Note   Subjective: UCx and 1/2 blood cx's growing GNR's, reflex ID panel suggesting EColi.  Patient feeling much better overall.    Vitals:   06/19/16 2134 06/19/16 2203 06/20/16 0548 06/20/16 1510  BP: (!) 130/58 (!) 124/51 (!) 125/50 (!) 148/59  Pulse: 78 68 80 91  Resp:  18 18 (!) 24  Temp:  97.5 F (36.4 C) 98.8 F (37.1 C) 98.9 F (37.2 C)  TempSrc:  Oral Oral Oral  SpO2:  99% 98% 99%  Weight:      Height:        Inpatient medications: . calcium carbonate  4 tablet Oral Daily  . cefTRIAXone (ROCEPHIN)  IV  2 g Intravenous Q0600  . dorzolamide  2 drop Right Eye Daily  . heparin  5,000 Units Subcutaneous Q8H  . latanoprost  1 drop Both Eyes QHS  . metoprolol  50 mg Oral BID  . sodium bicarbonate  1,300 mg Oral Daily   . sodium chloride 50 mL/hr at 06/20/16 1420   acetaminophen **OR** acetaminophen, bisacodyl, ondansetron **OR** ondansetron (ZOFRAN) IV, polyethylene glycol, traZODone  Exam: Gen chron ill, pleasant No jvd or bruits Chest clear bilat RRR no MRG Abd soft ntnd no mass or ascites +bs, RLQ urostomy bag w clear urine GU normal male MS no joint effusions or deformity Ext no LE edema Neuro is alert, Ox 3 , nf, no asterixis L RC AVF not real well developed, +bruit  Home meds > Lasix 40/d, Lopressor 50 bid, eye drops, NaHCO3 tabs, CaCO3  Renal US > R kidney length: 11.8, no hydronephrosis.  Left Kidney: length: 10.9 cm. Increased cortical echogenicity.[name]. (with) moderate to marked left hydronephrosis  Assessment: 1.  Acute on CRF - due to vol depletion/UTI, improving 2.  Left renal hydronephrosis - mod/ severe by [place], not sure how old this is.  Pt's urologist is Dr Karsten Ro 3.  UTI/bacteremia - GNR's in blood and urine, on Rocephin, temps down 4.  Vol depletion - getting IVF"s.  5.  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  Hx cystectomy and R ileal conduit  2007 - for bladder cancer 7.  Intermittent gross hematuria -f/b urology for this for years,has had extensive w/u per pt not an issue now  Plan - as above, have d/w primary, may need urology input this week regarding L Douglass Rivers MD [place] pager [phone]    cell [phone] 06/20/2016, 3:30 PM    Recent Labs Lab 06/19/16 0936 06/19/16 1611 06/20/16 0536  NA 138 140 139  K 4.7 4.3 4.6  CL 108 111 111  CO2 16* 18* 17*  GLUCOSE 155* 156* 106*  BUN 110* 101* 104*  CREATININE 9.11* 8.62* 7.71*  CALCIUM 9.3 8.5* 7.9*    Recent Labs Lab 06/19/16 0936  AST 12*  ALT 11*  ALKPHOS 51  BILITOT 0.6  PROT 7.1  ALBUMIN 3.3*    Recent Labs Lab 06/19/16 0936 06/20/16 0536 06/20/16 0801  WBC 8.4 11.0* 10.4  NEUTROABS 8.2*  --   --   HGB 9.2* 6.9* 7.9*  HCT 28.8* 22.5* 25.8*  MCV 98.6 102.3* 102.4*  PLT 174 135* 126*   Iron/TIBC/Ferritin/ %Sat    Component Value Date/Time   IRON 86 07/31/2010 1309   TIBC 280 07/31/2010 1309   FERRITIN 650 (H) 07/31/2010 1309   IRONPCTSAT 31 07/31/2010 1309

## 2016-06-20 NOTE — Progress Notes (Signed)
PHARMACY - PHYSICIAN COMMUNICATION CRITICAL VALUE ALERT - BLOOD CULTURE IDENTIFICATION (BCID)  Results for orders placed or performed during the hospital encounter of 06/19/16  Blood Culture ID Panel (Reflexed) (Collected: 06/19/2016  9:34 AM)  Result Value Ref Range   Enterococcus species NOT DETECTED NOT DETECTED   Vancomycin resistance NOT DETECTED NOT DETECTED   Listeria monocytogenes NOT DETECTED NOT DETECTED   Staphylococcus species NOT DETECTED NOT DETECTED   Staphylococcus aureus NOT DETECTED NOT DETECTED   Methicillin resistance NOT DETECTED NOT DETECTED   Streptococcus species NOT DETECTED NOT DETECTED   Streptococcus agalactiae NOT DETECTED NOT DETECTED   Streptococcus pneumoniae NOT DETECTED NOT DETECTED   Streptococcus pyogenes NOT DETECTED NOT DETECTED   Acinetobacter baumannii NOT DETECTED NOT DETECTED   Enterobacteriaceae species DETECTED (A) NOT DETECTED   Enterobacter cloacae complex NOT DETECTED NOT DETECTED   Escherichia coli NOT DETECTED NOT DETECTED   Klebsiella oxytoca DETECTED (A) NOT DETECTED   Klebsiella pneumoniae NOT DETECTED NOT DETECTED   Proteus species NOT DETECTED NOT DETECTED   Serratia marcescens NOT DETECTED NOT DETECTED   Carbapenem resistance NOT DETECTED NOT DETECTED   Haemophilus influenzae NOT DETECTED NOT DETECTED   Neisseria meningitidis NOT DETECTED NOT DETECTED   Pseudomonas aeruginosa NOT DETECTED NOT DETECTED   Candida albicans NOT DETECTED NOT DETECTED   Candida glabrata NOT DETECTED NOT DETECTED   Candida krusei NOT DETECTED NOT DETECTED   Candida parapsilosis NOT DETECTED NOT DETECTED   Candida tropicalis NOT DETECTED NOT DETECTED    Name of physician (or Provider) Contacted: Dr Jani Gravel   Changes to prescribed antibiotics required: Change Rocephin to 2gm IV q24h (dosing for bacteremia)  Sherlon Handing, PharmD, BCPS Clinical pharmacist, pager [phone] 06/20/2016  5:01 AM

## 2016-06-20 NOTE — Progress Notes (Signed)
Lab called at 0722 with results of Hemoglobin 6.9. MD notified at [phone].

## 2016-06-20 NOTE — Progress Notes (Signed)
Pt ambulated to bathroom, I assist, front wheel walker.

## 2016-06-20 NOTE — Evaluation (Signed)
Physical Therapy Evaluation Patient Details Name: Matthew Mays MRN: [id number] DOB: 1941-08-02 Today's Date: 06/20/2016   History of Present Illness  Patient is a 75 yo male admitted 06/19/16 with weakness, UTI, Lt hydronephrosis.    PMH:  HTN, CKD IV, bladder CA, DM  Clinical Impression  Patient presents with problems listed below.  Will benefit from acute PT to maximize functional independence prior to discharge home.  Patient will need to achieve Mod I level with mobility and gait to return home alone safely.  If unable to achieve this level, may need to consider other options (ST-SNF or 24 hour assist).    Follow Up Recommendations Home health PT;Supervision - Intermittent (Patient may decline HHPT)    Equipment Recommendations  3in1 (PT)    Recommendations for Other Services       Precautions / Restrictions Precautions Precautions: Fall Restrictions Weight Bearing Restrictions: No      Mobility  Bed Mobility Overal bed mobility: Needs Assistance Bed Mobility: Supine to Sit;Sit to Supine     Supine to sit: Min assist;HOB elevated Sit to supine: Min assist;HOB elevated   General bed mobility comments: Verbal cues for technique.  Assist to raise trunk to sitting position.  Once upright, patient with good sitting balance.  Assist to bring LE's onto bed to return to supine.  Transfers Overall transfer level: Needs assistance Equipment used: Rolling walker (2 wheeled) Transfers: Sit to/from Stand Sit to Stand: Min assist         General transfer comment: Verbal cues for hand placement.  Assist to power up to standing from bed x2.    Ambulation/Gait Ambulation/Gait assistance: Min guard Ambulation Distance (Feet): 30 Feet Assistive device: Rolling walker (2 wheeled) Gait Pattern/deviations: Step-through pattern;Decreased step length - right;Decreased step length - left;Decreased stride length;Shuffle;Trunk flexed Gait velocity: decreased Gait velocity interpretation:  Below normal speed for age/gender General Gait Details: Verbal cues for safe use of RW and to stand upright in RW.  Patient with slow, shuffling gait, with flexed posture.  No loss of balance with use of RW.  Stairs            Wheelchair Mobility    Modified Rankin (Stroke Patients Only)       Balance Overall balance assessment: Needs assistance         Standing balance support: Bilateral upper extremity supported Standing balance-Leahy Scale: Poor                               Pertinent Vitals/Pain Pain Assessment: No/denies pain    Home Living Family/patient expects to be discharged to:: Private residence Living Arrangements: Alone Available Help at Discharge: Family;Neighbor;Available PRN/intermittently (Dtr in [place]; Son in [place]; neighbor offers help) Type of Home: House Home Access: Stairs to enter Entrance Stairs-Rails: None Entrance Stairs-Number of Steps: 1 Home Layout: One level Home Equipment: Environmental consultant - 2 wheels;Wheelchair - manual (Foldable cane-seat)      Prior Function Level of Independence: Independent         Comments: Mows grass on riding mower; drives     Hand Dominance        Extremity/Trunk Assessment   Upper Extremity Assessment: Overall WFL for tasks assessed;Defer to OT evaluation           Lower Extremity Assessment: Generalized weakness      Cervical / Trunk Assessment: Kyphotic  Communication   Communication: No difficulties  Cognition Arousal/Alertness: Awake/alert Behavior During  Therapy: WFL for tasks assessed/performed;Flat affect Overall Cognitive Status: Within Functional Limits for tasks assessed                      General Comments      Exercises        Assessment/Plan    PT Assessment Patient needs continued PT services  PT Diagnosis Difficulty walking;Generalized weakness   PT Problem List Decreased strength;Decreased activity tolerance;Decreased  balance;Decreased mobility;Decreased knowledge of use of DME  PT Treatment Interventions DME instruction;Gait training;Functional mobility training;Therapeutic activities;Patient/family education   PT Goals (Current goals can be found in the Care Plan section) Acute Rehab PT Goals Patient Stated Goal: To return home PT Goal Formulation: With patient Time For Goal Achievement: 06/27/16 Potential to Achieve Goals: Good    Frequency Min 3X/week   Barriers to discharge Decreased caregiver support Patient lives alone, with prn assist from children and neighbor    Co-evaluation               End of Session Equipment Utilized During Treatment: Gait belt Activity Tolerance: Patient tolerated treatment well;Patient limited by fatigue Patient left: in bed;with call bell/phone within reach;with bed alarm set Nurse Communication: Mobility status         Time: [id number] PT Time Calculation (min) (ACUTE ONLY): 21 min   Charges:   PT Evaluation $PT Eval Moderate Complexity: 1 Procedure     PT G CodesDespina Pole 2016/07/15, 8:51 PM Carita Pian. Sanjuana Kava, [place] Pager [phone]

## 2016-06-21 DIAGNOSIS — N185 Chronic kidney disease, stage 5: Secondary | ICD-10-CM

## 2016-06-21 LAB — CBC
HCT: 22.9 % — ABNORMAL LOW (ref 39.0–52.0)
HEMOGLOBIN: 7.3 g/dL — AB (ref 13.0–17.0)
MCH: 31.7 pg (ref 26.0–34.0)
MCHC: 31.9 g/dL (ref 30.0–36.0)
MCV: 99.6 fL (ref 78.0–100.0)
Platelets: 122 10*3/uL — ABNORMAL LOW (ref 150–400)
RBC: 2.3 MIL/uL — AB (ref 4.22–5.81)
RDW: 15.6 % — ABNORMAL HIGH (ref 11.5–15.5)
WBC: 7.2 10*3/uL (ref 4.0–10.5)

## 2016-06-21 LAB — RENAL FUNCTION PANEL
ALBUMIN: 2.5 g/dL — AB (ref 3.5–5.0)
ANION GAP: 13 (ref 5–15)
BUN: 104 mg/dL — ABNORMAL HIGH (ref 6–20)
CALCIUM: 7.9 mg/dL — AB (ref 8.9–10.3)
CO2: 14 mmol/L — ABNORMAL LOW (ref 22–32)
Chloride: 111 mmol/L (ref 101–111)
Creatinine, Ser: 7.55 mg/dL — ABNORMAL HIGH (ref 0.61–1.24)
GFR calc non Af Amer: 6 mL/min — ABNORMAL LOW (ref >60)
GFR, EST AFRICAN AMERICAN: 7 mL/min — AB (ref >60)
Glucose, Bld: 96 mg/dL (ref 65–99)
PHOSPHORUS: 6 mg/dL — AB (ref 2.5–4.6)
Potassium: 4.8 mmol/L (ref 3.5–5.1)
SODIUM: 138 mmol/L (ref 135–145)

## 2016-06-21 LAB — URINE CULTURE: Culture: >=100000 — AB

## 2016-06-21 LAB — GLUCOSE, CAPILLARY: Glucose-Capillary: 125 mg/dL — ABNORMAL HIGH (ref 65–99)

## 2016-06-21 MED ORDER — CEPHALEXIN 250 MG PO CAPS: 250.0000 mg | ORAL_CAPSULE | Freq: Two times a day (BID) | ORAL | 0 refills | Status: AC

## 2016-06-21 MED ORDER — CEPHALEXIN 250 MG PO CAPS
250.0000 mg | ORAL_CAPSULE | Freq: Two times a day (BID) | ORAL | Status: DC
  Administered 2016-06-21: 250 mg via ORAL
  Filled 2016-06-21: qty 1

## 2016-06-21 NOTE — Progress Notes (Signed)
[facility] Progress Note   Subjective: UCx and 1/2 blood cx's growing Klebsiella.  Pt feeling well and has no complaints today.  Vitals:   06/20/16 1654 06/20/16 2201 06/21/16 0514 06/21/16 0950  BP:  (!) 162/67 (!) 159/62 140/62  Pulse:  95 86 84  Resp:  18 18 18   Temp: 99.1 F (37.3 C) 98.7 F (37.1 C) 98.3 F (36.8 C) 97.4 F (36.3 C)  TempSrc: Oral Oral Oral Oral  SpO2:  96% 97% 98%  Weight:      Height:        Inpatient medications: . calcium carbonate  4 tablet Oral Daily  . cefTRIAXone (ROCEPHIN)  IV  2 g Intravenous Q0600  . dorzolamide  2 drop Right Eye Daily  . heparin  5,000 Units Subcutaneous Q8H  . latanoprost  1 drop Both Eyes QHS  . metoprolol  50 mg Oral BID  . sodium bicarbonate  1,300 mg Oral Daily   . sodium chloride 50 mL/hr at 06/21/16 0200   acetaminophen **OR** acetaminophen, bisacodyl, ondansetron **OR** ondansetron (ZOFRAN) IV, polyethylene glycol, traZODone  Exam: Gen chron ill, pleasant, sitting up in chair ENT: MMM NECK: No jvd or bruits PULM: Chest clear bilat CV: RRR no MRG ABD: soft ntnd no mass or ascites +bs, RLQ urostomy bag draining clear yellow EXT: no LE edema NEURO: is alert, Ox 3 , nf, no asterixis L RC AVF with +thrill and bruit but is quite small   Home meds > Lasix 40/d, Lopressor 50 bid, eye drops, NaHCO3 tabs, CaCO3  Renal US > R kidney length: 11.8, no hydronephrosis.  Left Kidney: length: 10.9 cm. Increased cortical echogenicity.[name]. (with) moderate to marked left hydronephrosis  Assessment/Plan: 1.  Acute on CRF - due to vol depletion/UTI, improving.  Creatinine nearly at baseline. 2.  Left renal hydronephrosis - mod/ severe by [place].  Pt's urologist is Dr Karsten Ro.  Apparently this is ~75 years old. 3.  UTI/bacteremia - Klebsiella in blood and urine, on Rocephin, afebrile.  Likely switching to orals (? Quinolone) in prep for d/c today or tomorrow per pt.   4.  Vol depletion - getting IVF"s, appears  improved, would stop and allow pt to eat and drink to thirst  5.  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  Hx cystectomy and R ileal conduit 2007 - for bladder cancer 7.  Intermittent gross hematuria -f/b urology for this for years,has had extensive w/u per pt not an issue now    Madelon Lips MD [place] pgr [phone] cell [phone] 06/21/2016, 10:31 AM    Recent Labs Lab 06/19/16 1611 06/20/16 0536 06/21/16 0557  NA 140 139 138  K 4.3 4.6 4.8  CL 111 111 111  CO2 18* 17* 14*  GLUCOSE 156* 106* 96  BUN 101* 104* 104*  CREATININE 8.62* 7.71* 7.55*  CALCIUM 8.5* 7.9* 7.9*  PHOS  --   --  6.0*    Recent Labs Lab 06/19/16 0936 06/21/16 0557  AST 12*  --   ALT 11*  --   ALKPHOS 51  --   BILITOT 0.6  --   PROT 7.1  --   ALBUMIN 3.3* 2.5*    Recent Labs Lab 06/19/16 0936 06/20/16 0536 06/20/16 0801 06/21/16 0551  WBC 8.4 11.0* 10.4 7.2  NEUTROABS 8.2*  --   --   --   HGB 9.2* 6.9* 7.9* 7.3*  HCT 28.8* 22.5* 25.8* 22.9*  MCV 98.6 102.3* 102.4* 99.6  PLT 174 135* 126* 122*   Iron/TIBC/Ferritin/ %Sat    Component Value Date/Time   IRON 86 07/31/2010 1309   TIBC 280 07/31/2010 1309   FERRITIN 650 (H) 07/31/2010 1309   IRONPCTSAT 31 07/31/2010 1309

## 2016-06-21 NOTE — Discharge Summary (Signed)
Physician Discharge Summary  Matthew Mays [id number] DOB: 1941-03-10 DOA: 06/19/2016  PCP: Estanislado Emms, MD  Admit date: 06/19/2016 Discharge date: 06/21/2016  Admitted From: Home Disposition:  Home with home health  Recommendations for Outpatient Follow-up:  1. Follow up with PCP in 2 weeks to reevaluate progress towards treating bacteremia. 2. Please obtain BMP/CBC in one week 3. 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 He presented to ED on 8/25 with generalized weakness, couldn't stand w/o help. In ED was febrile, tachycardic with AKI on CKD, Cr 9 up from baseline ~ 5.UA showed [facility], consistent with UTI. Admitted with gentle IVF's and IV rocephin. He has remained afebrile for 48 hours, vital signs have stabilized, lactate has cleared, creatinine trended down with gentle hydration to his baseline, about 7.5. Nephrology saw him while admitted, and had no recommendations. He is to follow up with Dr. Florene Glen. Urine and blood cultures returned showing > 100k Klebsiella oxytoca sensitive to all the tested abx except ampicillin. He regained much strength and will receive home health PT to continue regaining strength.   He will be discharged on keflex 250mg  BID to complete 14 days of therapy.   Discharge Diagnoses:  Active Problems:   UTI (lower urinary tract infection)   Sepsis (HCC)   Hyperglycemia   Hypertension   AKI (acute kidney injury) ([place])   CKD (chronic kidney disease), stage V (HCC)  Acute on chronic anemia: Without signs/symptoms of bleeding hgb dropped 9.2 > 7.3 with rehydration. He has been on injections for this in the past, but none recently.  AKI superimposed on CKD: Cr slowly  improving with IVF's. Renal U/S shows left hydronephrosis of > 10 years duration (confirmed by [facility] records, his urologist is Dr. Karsten Ro). No HD indications.   Hypertension, controlledon home lopressor  Discharge Instructions  Discharge Instructions    Diet - low sodium heart healthy    Complete by:  As directed   Discharge instructions    Complete by:  As directed   You were admitted for weakness due to a serious infection in your urinary tract which is also in the bloodstream. This has been treated effectively with IV antibiotics and IV fluids. The blood culture showed that you have an infection with Klebsiella oxytoca which is susceptible to many antibiotics. The oral antibiotic prescribed for you and dose tailored to the chronic kidney disease is KEFLEX. You will need to take a full 2 weeks of this, 250mg  twice per day. You must return for care urgently if you feel a fever, chills, worsening weakness, or change in urine output.   Make sure to follow up with your kidney doctor to discuss your chronic kidney disease and anemia.   It was a pleasure getting to know you. Take care! - Dr. Bonner Puna   Increase activity slowly    Complete by:  As directed       Medication List    TAKE these medications   calcium carbonate 750 MG chewable tablet Commonly known as:  TUMS EX Chew 4 tablets by mouth daily.   cephALEXin 250 MG capsule Commonly known as:  KEFLEX Take 1 capsule (250 mg total) by mouth every 12 (twelve) hours.  dorzolamide 2 % ophthalmic solution Commonly known as:  TRUSOPT Place 2 drops into the right eye daily.   furosemide 40 MG tablet Commonly known as:  LASIX Take 40 mg by mouth daily.   latanoprost 0.005 % ophthalmic solution Commonly known as:  XALATAN Place 1 drop into both eyes daily. Takes at 5 PM   metoprolol 50 MG tablet Commonly known as:  LOPRESSOR Take 50 mg by mouth 2 (two) times daily.   OVER THE COUNTER MEDICATION Take 1 tablet by  mouth daily. Iron 65mg    sodium bicarbonate 650 MG tablet Take 1,300 mg by mouth daily.      Follow-up Information    POWELL,ALVIN C, MD. Schedule an appointment as soon as possible for a visit in 2 week(s).   Specialty:  Nephrology Contact information: [address] [place] [place] [address] [phone]          No Known Allergies  Consultations:  Nephrology, Dr. Jonnie Finner of [facility]   Procedures/Studies: Dg Chest 2 View  Result Date: 06/19/2016 CLINICAL DATA:  Sepsis EXAM: CHEST  2 VIEW COMPARISON:  None. FINDINGS: The heart size appears normal. There is no pleural effusion or edema. Aortic atherosclerosis noted. No airspace consolidation. IMPRESSION: 1. Aortic atherosclerosis. 2. No acute findings. Electronically Signed   By: Kerby Moors M.D.   On: 06/19/2016 11:31   US Renal  Result Date: 06/19/2016 CLINICAL DATA:  Acute on chronic renal disease. Post cystectomy with ileal diversion. History of bladder cancer. EXAM: RENAL / URINARY TRACT ULTRASOUND COMPLETE COMPARISON:  CT abdomen pelvis 07/21/2012 FINDINGS: Right Kidney: Length: 11.8. There is increased echogenicity of the renal parenchyma. Shadowing calculi are seen in the mid and lower renal pole, the largest measuring 9 mm. No evidence of hydronephrosis. Left Kidney: Length: 10.9 cm. Increased cortical echogenicity. There is a moderate to marked left hydronephrosis. Bladder: Surgically absent. IMPRESSION: Moderate to marked left hydronephrosis. Increased echogenicity of bilateral renal parenchyma, consistent with medical renal disease. Mid and lower pole nonobstructive right nephrolithiasis. Electronically Signed   By: Fidela Salisbury M.D.   On: 06/19/2016 17:10     Subjective: Pt's strength is improved, good appetite. No fever, chills, abd pain, change in UOP.   Discharge Exam: Vitals:   06/21/16 0514 06/21/16 0950  BP: (!) 159/62 140/62  Pulse: 86 84  Resp: 18 18  Temp: 98.3 F (36.8 C)  97.4 F (36.3 C)   Vitals:   06/20/16 1654 06/20/16 2201 06/21/16 0514 06/21/16 0950  BP:  (!) 162/67 (!) 159/62 140/62  Pulse:  95 86 84  Resp:  18 18 18   Temp: 99.1 F (37.3 C) 98.7 F (37.1 C) 98.3 F (36.8 C) 97.4 F (36.3 C)  TempSrc: Oral Oral Oral Oral  SpO2:  96% 97% 98%  Weight:      Height:        General: Pt is alert, awake, not in acute distress Cardiovascular: RRR, S1/S2 +, no rubs, no gallops Respiratory: CTA bilaterally, no wheezing, no rhonchi Abdominal: Soft, NT, ND, bowel sounds +. RLQ ileostomy site c/d/i with clear urine in bag. No CVA tenderness Extremities: no edema, no cyanosis  The results of significant diagnostics from this hospitalization (including imaging, microbiology, ancillary and laboratory) are listed below for reference.     Microbiology: Recent Results (from the past 240 hour(s))  Culture, blood (Routine x  2)     Status: Abnormal (Preliminary result)   Collection Time: 06/19/16  9:34 AM  Result Value Ref Range Status   Specimen Description BLOOD RIGHT HAND  Final   Special Requests BOTTLES DRAWN AEROBIC AND ANAEROBIC 5CC  Final   Culture  Setup Time   Final    GRAM NEGATIVE RODS AEROBIC BOTTLE ONLY CRITICAL RESULT CALLED TO, READ BACK BY AND VERIFIED WITH: I.MASLENJAK,RN [id number]:[id number] 06/20/16 G.MCADOO    Culture KLEBSIELLA OXYTOCA (A)  Final   Report Status PENDING  Incomplete  Blood Culture ID Panel (Reflexed)     Status: Abnormal   Collection Time: 06/19/16  9:34 AM  Result Value Ref Range Status   Enterococcus species NOT DETECTED NOT DETECTED Final   Vancomycin resistance NOT DETECTED NOT DETECTED Final   Listeria monocytogenes NOT DETECTED NOT DETECTED Final   Staphylococcus species NOT DETECTED NOT DETECTED Final   Staphylococcus aureus NOT DETECTED NOT DETECTED Final   Methicillin resistance NOT DETECTED NOT DETECTED Final   Streptococcus species NOT DETECTED NOT DETECTED Final   Streptococcus agalactiae NOT DETECTED NOT DETECTED  Final   Streptococcus pneumoniae NOT DETECTED NOT DETECTED Final   Streptococcus pyogenes NOT DETECTED NOT DETECTED Final   Acinetobacter baumannii NOT DETECTED NOT DETECTED Final   Enterobacteriaceae species DETECTED (A) NOT DETECTED Final    Comment: CRITICAL RESULT CALLED TO, READ BACK BY AND VERIFIED WITH: K.AMEND,PHARMD [id number] 06/20/16 G.MCADOO    Enterobacter cloacae complex NOT DETECTED NOT DETECTED Final   Escherichia coli NOT DETECTED NOT DETECTED Final   Klebsiella oxytoca DETECTED (A) NOT DETECTED Final    Comment: CRITICAL RESULT CALLED TO, READ BACK BY AND VERIFIED WITH: K.AMEND,PHARMD [id number] 06/20/16 G.MCADOO    Klebsiella pneumoniae NOT DETECTED NOT DETECTED Final   Proteus species NOT DETECTED NOT DETECTED Final   Serratia marcescens NOT DETECTED NOT DETECTED Final   Carbapenem resistance NOT DETECTED NOT DETECTED Final   Haemophilus influenzae NOT DETECTED NOT DETECTED Final   Neisseria meningitidis NOT DETECTED NOT DETECTED Final   Pseudomonas aeruginosa NOT DETECTED NOT DETECTED Final   Candida albicans NOT DETECTED NOT DETECTED Final   Candida glabrata NOT DETECTED NOT DETECTED Final   Candida krusei NOT DETECTED NOT DETECTED Final   Candida parapsilosis NOT DETECTED NOT DETECTED Final   Candida tropicalis NOT DETECTED NOT DETECTED Final  Culture, blood (Routine x 2)     Status: None (Preliminary result)   Collection Time: 06/19/16  9:36 AM  Result Value Ref Range Status   Specimen Description BLOOD RIGHT ANTECUBITAL  Final   Special Requests BOTTLES DRAWN AEROBIC AND ANAEROBIC 5CC  Final   Culture NO GROWTH 1 DAY  Final   Report Status PENDING  Incomplete  Urine culture     Status: Abnormal   Collection Time: 06/19/16  9:36 AM  Result Value Ref Range Status   Specimen Description URINE, RANDOM  Final   Special Requests NONE  Final   Culture >=100,000 COLONIES/mL KLEBSIELLA OXYTOCA (A)  Final   Report Status 06/21/2016 FINAL  Final   Organism ID, Bacteria  KLEBSIELLA OXYTOCA (A)  Final      Susceptibility   Klebsiella oxytoca - MIC*    AMPICILLIN 16 RESISTANT Resistant     CEFAZOLIN <=4 SENSITIVE Sensitive     CEFTRIAXONE <=1 SENSITIVE Sensitive     CIPROFLOXACIN <=0.25 SENSITIVE Sensitive     GENTAMICIN <=1 SENSITIVE Sensitive     IMIPENEM <=0.25 SENSITIVE Sensitive     NITROFURANTOIN 32 SENSITIVE Sensitive  TRIMETH/SULFA <=20 SENSITIVE Sensitive     AMPICILLIN/SULBACTAM 4 SENSITIVE Sensitive     PIP/TAZO <=4 SENSITIVE Sensitive     Extended ESBL NEGATIVE Sensitive     * >=100,000 COLONIES/mL KLEBSIELLA OXYTOCA     Labs: BNP (last 3 results) No results for input(s): BNP in the last 8760 hours. Basic Metabolic Panel:  Recent Labs Lab 06/19/16 0936 06/19/16 1611 06/20/16 0536 06/21/16 0557  NA 138 140 139 138  K 4.7 4.3 4.6 4.8  CL 108 111 111 111  CO2 16* 18* 17* 14*  GLUCOSE 155* 156* 106* 96  BUN 110* 101* 104* 104*  CREATININE 9.11* 8.62* 7.71* 7.55*  CALCIUM 9.3 8.5* 7.9* 7.9*  PHOS  --   --   --  6.0*   Liver Function Tests:  Recent Labs Lab 06/19/16 0936 06/21/16 0557  AST 12*  --   ALT 11*  --   ALKPHOS 51  --   BILITOT 0.6  --   PROT 7.1  --   ALBUMIN 3.3* 2.5*   No results for input(s): LIPASE, AMYLASE in the last 168 hours. No results for input(s): AMMONIA in the last 168 hours. CBC:  Recent Labs Lab 06/19/16 0936 06/20/16 0536 06/20/16 0801 06/21/16 0551  WBC 8.4 11.0* 10.4 7.2  NEUTROABS 8.2*  --   --   --   HGB 9.2* 6.9* 7.9* 7.3*  HCT 28.8* 22.5* 25.8* 22.9*  MCV 98.6 102.3* 102.4* 99.6  PLT 174 135* 126* 122*   Cardiac Enzymes: No results for input(s): CKTOTAL, CKMB, CKMBINDEX, TROPONINI in the last 168 hours. BNP: Invalid input(s): POCBNP CBG:  Recent Labs Lab 06/19/16 0941 06/20/16 1208  GLUCAP 149* 170*   D-Dimer No results for input(s): DDIMER in the last 72 hours. Hgb A1c  Recent Labs  06/19/16 1611  HGBA1C 5.5   Lipid Profile No results for input(s): CHOL,  HDL, LDLCALC, TRIG, CHOLHDL, LDLDIRECT in the last 72 hours. Thyroid function studies No results for input(s): TSH, T4TOTAL, T3FREE, THYROIDAB in the last 72 hours.  Invalid input(s): FREET3 Anemia work up No results for input(s): VITAMINB12, FOLATE, FERRITIN, TIBC, IRON, RETICCTPCT in the last 72 hours. Urinalysis    Component Value Date/Time   COLORURINE YELLOW 06/19/2016 0936   APPEARANCEUR HAZY (A) 06/19/2016 0936   LABSPEC 1.014 06/19/2016 0936   PHURINE 6.5 06/19/2016 0936   GLUCOSEU NEGATIVE 06/19/2016 0936   HGBUR MODERATE (A) 06/19/2016 0936   BILIRUBINUR NEGATIVE 06/19/2016 0936   KETONESUR NEGATIVE 06/19/2016 0936   PROTEINUR 100 (A) 06/19/2016 0936   NITRITE NEGATIVE 06/19/2016 0936   LEUKOCYTESUR MODERATE (A) 06/19/2016 0936   Sepsis Labs Invalid input(s): PROCALCITONIN,  WBC,  LACTICIDVEN Microbiology Recent Results (from the past 240 hour(s))  Culture, blood (Routine x 2)     Status: Abnormal (Preliminary result)   Collection Time: 06/19/16  9:34 AM  Result Value Ref Range Status   Specimen Description BLOOD RIGHT HAND  Final   Special Requests BOTTLES DRAWN AEROBIC AND ANAEROBIC 5CC  Final   Culture  Setup Time   Final    GRAM NEGATIVE RODS AEROBIC BOTTLE ONLY CRITICAL RESULT CALLED TO, READ BACK BY AND VERIFIED WITH: I.MASLENJAK,RN [id number] 06/20/16 G.MCADOO    Culture KLEBSIELLA OXYTOCA (A)  Final   Report Status PENDING  Incomplete  Blood Culture ID Panel (Reflexed)     Status: Abnormal   Collection Time: 06/19/16  9:34 AM  Result Value Ref Range Status   Enterococcus species NOT DETECTED NOT DETECTED Final  Vancomycin resistance NOT DETECTED NOT DETECTED Final   Listeria monocytogenes NOT DETECTED NOT DETECTED Final   Staphylococcus species NOT DETECTED NOT DETECTED Final   Staphylococcus aureus NOT DETECTED NOT DETECTED Final   Methicillin resistance NOT DETECTED NOT DETECTED Final   Streptococcus species NOT DETECTED NOT DETECTED Final   Streptococcus  agalactiae NOT DETECTED NOT DETECTED Final   Streptococcus pneumoniae NOT DETECTED NOT DETECTED Final   Streptococcus pyogenes NOT DETECTED NOT DETECTED Final   Acinetobacter baumannii NOT DETECTED NOT DETECTED Final   Enterobacteriaceae species DETECTED (A) NOT DETECTED Final    Comment: CRITICAL RESULT CALLED TO, READ BACK BY AND VERIFIED WITH: K.AMEND,PHARMD [id number] 06/20/16 G.MCADOO    Enterobacter cloacae complex NOT DETECTED NOT DETECTED Final   Escherichia coli NOT DETECTED NOT DETECTED Final   Klebsiella oxytoca DETECTED (A) NOT DETECTED Final    Comment: CRITICAL RESULT CALLED TO, READ BACK BY AND VERIFIED WITH: K.AMEND,PHARMD [id number] 06/20/16 G.MCADOO    Klebsiella pneumoniae NOT DETECTED NOT DETECTED Final   Proteus species NOT DETECTED NOT DETECTED Final   Serratia marcescens NOT DETECTED NOT DETECTED Final   Carbapenem resistance NOT DETECTED NOT DETECTED Final   Haemophilus influenzae NOT DETECTED NOT DETECTED Final   Neisseria meningitidis NOT DETECTED NOT DETECTED Final   Pseudomonas aeruginosa NOT DETECTED NOT DETECTED Final   Candida albicans NOT DETECTED NOT DETECTED Final   Candida glabrata NOT DETECTED NOT DETECTED Final   Candida krusei NOT DETECTED NOT DETECTED Final   Candida parapsilosis NOT DETECTED NOT DETECTED Final   Candida tropicalis NOT DETECTED NOT DETECTED Final  Culture, blood (Routine x 2)     Status: None (Preliminary result)   Collection Time: 06/19/16  9:36 AM  Result Value Ref Range Status   Specimen Description BLOOD RIGHT ANTECUBITAL  Final   Special Requests BOTTLES DRAWN AEROBIC AND ANAEROBIC 5CC  Final   Culture NO GROWTH 1 DAY  Final   Report Status PENDING  Incomplete  Urine culture     Status: Abnormal   Collection Time: 06/19/16  9:36 AM  Result Value Ref Range Status   Specimen Description URINE, RANDOM  Final   Special Requests NONE  Final   Culture >=100,000 COLONIES/mL KLEBSIELLA OXYTOCA (A)  Final   Report Status 06/21/2016 FINAL   Final   Organism ID, Bacteria KLEBSIELLA OXYTOCA (A)  Final      Susceptibility   Klebsiella oxytoca - MIC*    AMPICILLIN 16 RESISTANT Resistant     CEFAZOLIN <=4 SENSITIVE Sensitive     CEFTRIAXONE <=1 SENSITIVE Sensitive     CIPROFLOXACIN <=0.25 SENSITIVE Sensitive     GENTAMICIN <=1 SENSITIVE Sensitive     IMIPENEM <=0.25 SENSITIVE Sensitive     NITROFURANTOIN 32 SENSITIVE Sensitive     TRIMETH/SULFA <=20 SENSITIVE Sensitive     AMPICILLIN/SULBACTAM 4 SENSITIVE Sensitive     PIP/TAZO <=4 SENSITIVE Sensitive     Extended ESBL NEGATIVE Sensitive     * >=100,000 COLONIES/mL KLEBSIELLA OXYTOCA   Time coordinating discharge: Over 30 minutes  SIGNED: Vance Gather, MD  [facility] 06/21/2016, 12:07 PM Pager [phone]  If 7PM-7AM, please contact night-coverage [email or web address] Password [email or web address]

## 2016-06-21 NOTE — Evaluation (Signed)
Occupational Therapy Evaluation/Discharge Patient Details Name: Matthew Mays MRN: [id number] DOB: [birth year] Today's Date: 06/21/2016    History of Present Illness Patient is a 75 yo male admitted 06/19/16 with weakness, UTI, Lt hydronephrosis.    PMH:  HTN, CKD IV, bladder CA, DM   Clinical Impression   PTA, pt was independent with ADLs and mobility. Pt currently presents with acute back pain, SOB on exertion, and deconditioning and required supervision for basic transfers and is mod I with seated ADLs. Pt's SpO2 93-94% on RA after activity. Reviewed energy conservation strategies with pt, however he is very familiar with these and uses many of them at home already. All education has been completed and pt has no further questions. Pt with no further acute OT needs. OT signing off.    Follow Up Recommendations  No OT follow up;Supervision - Intermittent    Equipment Recommendations  None recommended by OT    Recommendations for Other Services       Precautions / Restrictions Precautions Precautions: Fall Precaution Comments: pt denies h/o falls in past 1 year Restrictions Weight Bearing Restrictions: No      Mobility Bed Mobility               General bed mobility comments: Pt up in chair on OT arrival  Transfers Overall transfer level: Needs assistance Equipment used: Rolling walker (2 wheeled) Transfers: Sit to/from Stand Sit to Stand: Supervision         General transfer comment: Supervision for safety. Pt rocks back and forth for momentum to stand. Good demonstration of safe hand placement.    Balance Overall balance assessment: Needs assistance Sitting-balance support: No upper extremity supported;Feet supported Sitting balance-Leahy Scale: Good     Standing balance support: Bilateral upper extremity supported;During functional activity Standing balance-Leahy Scale: Fair                              ADL Overall ADL's : Modified  independent                                       General ADL Comments: Pt already using many energy conservation strategies due to back pain and SOB at times. Pt has many chairs set up around the house to take breaks, breaks tasks up into smaller activities and takes frequent breaks in between.      Vision Vision Assessment?: No apparent visual deficits   Perception     Praxis      Pertinent Vitals/Pain Pain Assessment: No/denies pain     Hand Dominance Right   Extremity/Trunk Assessment Upper Extremity Assessment Upper Extremity Assessment: Overall WFL for tasks assessed   Lower Extremity Assessment Lower Extremity Assessment: Generalized weakness   Cervical / Trunk Assessment Cervical / Trunk Assessment: Kyphotic   Communication Communication Communication: No difficulties   Cognition Arousal/Alertness: Awake/alert Behavior During Therapy: WFL for tasks assessed/performed Overall Cognitive Status: Within Functional Limits for tasks assessed                     General Comments       Exercises       Shoulder Instructions      Home Living Family/patient expects to be discharged to:: Private residence Living Arrangements: Alone Available Help at Discharge: Family;Available PRN/intermittently Type of Home: House Home Access: Stairs to enter  Entrance Stairs-Number of Steps: 1 Entrance Stairs-Rails: None Home Layout: One level     Bathroom Shower/Tub: Walk-in shower;Door   [employer] Toilet: Standard     Home Equipment: Environmental consultant - 2 wheels;Wheelchair - manual;Toilet riser;Grab bars - tub/shower;Hand held shower head;Shower seat (foldable cane-seat)          Prior Functioning/Environment Level of Independence: Independent        Comments: Mows grass on riding mower; drives; orders groceries online and picks them up    OT Diagnosis: Generalized weakness;Acute pain   OT Problem List: Decreased strength;Decreased range of  motion;Impaired balance (sitting and/or standing);Decreased activity tolerance;Pain   OT Treatment/Interventions:      OT Goals(Current goals can be found in the care plan section) Acute Rehab OT Goals Patient Stated Goal: To return home OT Goal Formulation: With patient Time For Goal Achievement: 07/05/16 Potential to Achieve Goals: Good  OT Frequency:     Barriers to D/C:            Co-evaluation              End of Session Equipment Utilized During Treatment: Rolling walker Nurse Communication: Mobility status  Activity Tolerance: Patient limited by pain Patient left: in chair;with call bell/phone within reach;with chair alarm set   Time: [phone] OT Time Calculation (min): 11 min Charges:  OT General Charges $OT Visit: 1 Procedure OT Evaluation $OT Eval Moderate Complexity: 1 Procedure G-Codes:    Redmond Baseman, OTR/L [phone]  06/21/2016, 1:03 PM

## 2016-06-21 NOTE — Care Management Note (Addendum)
Case Management Note  Patient Details  Name: [name] MRN: [id number] Date of Birth: 1941/04/26  Subjective/Objective:       Admitted with UTI.             Action/Plan: Plan is to d/c to home today with home health services (PT) in place.  Expected Discharge Date:  06/21/2016            Expected Discharge Plan:  [place]  In-House Referral:     Discharge planning Services  CM Consult  Post Acute Care Choice:    Choice offered to:  Patient  DME Arranged:  3-N-1(referral made with Brenton Grills [phone] DME Agency:  [place] Arranged:  PT (referral made with Butch Penny [phone]) [facility] Agency:  [place]  Status of Service:  Completed, signed off  If discussed at [place] of Stay Meetings, dates discussed:    Additional Comments:  Sharin Mons, RN 06/21/2016, 12:25 PM

## 2016-06-21 NOTE — Progress Notes (Signed)
Physical Therapy Treatment Patient Details Name: Matthew Mays MRN: [id number] DOB: 1941/04/08 Today's Date: 06/21/2016    History of Present Illness Patient is a 75 yo male admitted 06/19/16 with weakness, UTI, Lt hydronephrosis.    PMH:  HTN, CKD IV, bladder CA, DM    PT Comments    Pt progressing with mobility, he tolerated increased distance of 100' with ambulation. SaO2 95% on RA walking, HR 94. Pt stated he's near baseline with mobility.   Follow Up Recommendations  Home health PT;Supervision - Intermittent (Patient may decline HHPT)     Equipment Recommendations  3in1 (PT)    Recommendations for Other Services       Precautions / Restrictions Precautions Precautions: Fall Precaution Comments: pt denies h/o falls in past 1 year Restrictions Weight Bearing Restrictions: No    Mobility  Bed Mobility               General bed mobility comments: up in recliner  Transfers Overall transfer level: Needs assistance Equipment used: Rolling walker (2 wheeled) Transfers: Sit to/from Stand Sit to Stand: Supervision         General transfer comment: Verbal cues for hand placement, no physical assist  Ambulation/Gait Ambulation/Gait assistance: Supervision Ambulation Distance (Feet): 45 Feet Assistive device: Rolling walker (2 wheeled) Gait Pattern/deviations: Step-through pattern;Trunk flexed;Decreased stride length Gait velocity: decreased Gait velocity interpretation: Below normal speed for age/gender General Gait Details:   Patient with slow gait, with flexed posture.  No loss of balance with use of RW. SaO2 95% on RA walking, HR 94. Distance limited by fatigue, chronic LBP.    Stairs            Wheelchair Mobility    Modified Rankin (Stroke Patients Only)       Balance             Standing balance-Leahy Scale: Fair                      Cognition Arousal/Alertness: Awake/alert Behavior During Therapy: WFL for tasks  assessed/performed Overall Cognitive Status: Within Functional Limits for tasks assessed                      Exercises      General Comments        Pertinent Vitals/Pain Pain Assessment: No/denies pain    Home Living                      Prior Function            PT Goals (current goals can now be found in the care plan section) Acute Rehab PT Goals Patient Stated Goal: To return home PT Goal Formulation: With patient Time For Goal Achievement: 06/27/16 Potential to Achieve Goals: Good Progress towards PT goals: Progressing toward goals    Frequency  Min 3X/week    PT Plan Current plan remains appropriate    Co-evaluation             End of Session Equipment Utilized During Treatment: Gait belt Activity Tolerance: Patient tolerated treatment well;Patient limited by fatigue Patient left: with call bell/phone within reach;in chair;with chair alarm set     Time: 1141-1155 PT Time Calculation (min) (ACUTE ONLY): 14 min  Charges:  $Gait Training: 8-22 mins                    G Codes:      Blondell Reveal  [place] 06/21/2016, 12:02 PM [phone]

## 2016-06-21 NOTE — Progress Notes (Signed)
Discharge instruction follow appts and RX's explained and provided to patient , verbalized understanding. Home Health set up for patient  Contact information provided. Patient left floor via wheelchair accompanied by staff no c/o pain or shortness of breath at d/c.  Aviraj Kentner, Tivis Ringer, RN

## 2016-06-22 LAB — CULTURE, BLOOD (ROUTINE X 2)

## 2016-06-23 DIAGNOSIS — M6281 Muscle weakness (generalized): Secondary | ICD-10-CM | POA: Diagnosis not present

## 2016-06-23 DIAGNOSIS — Z8551 Personal history of malignant neoplasm of bladder: Secondary | ICD-10-CM | POA: Diagnosis not present

## 2016-06-23 DIAGNOSIS — N185 Chronic kidney disease, stage 5: Secondary | ICD-10-CM | POA: Diagnosis not present

## 2016-06-23 DIAGNOSIS — R7303 Prediabetes: Secondary | ICD-10-CM | POA: Diagnosis not present

## 2016-06-23 DIAGNOSIS — N390 Urinary tract infection, site not specified: Secondary | ICD-10-CM | POA: Diagnosis not present

## 2016-06-23 DIAGNOSIS — I120 Hypertensive chronic kidney disease with stage 5 chronic kidney disease or end stage renal disease: Secondary | ICD-10-CM | POA: Diagnosis not present

## 2016-06-23 DIAGNOSIS — Z742 Need for assistance at home and no other household member able to render care: Secondary | ICD-10-CM | POA: Diagnosis not present

## 2016-06-23 DIAGNOSIS — R2689 Other abnormalities of gait and mobility: Secondary | ICD-10-CM | POA: Diagnosis not present

## 2016-06-23 DIAGNOSIS — Z87891 Personal history of nicotine dependence: Secondary | ICD-10-CM | POA: Diagnosis not present

## 2016-06-24 LAB — CULTURE, BLOOD (ROUTINE X 2): Culture: NO GROWTH

## 2016-06-25 DIAGNOSIS — I120 Hypertensive chronic kidney disease with stage 5 chronic kidney disease or end stage renal disease: Secondary | ICD-10-CM | POA: Diagnosis not present

## 2016-06-25 DIAGNOSIS — N185 Chronic kidney disease, stage 5: Secondary | ICD-10-CM | POA: Diagnosis not present

## 2016-06-25 DIAGNOSIS — M6281 Muscle weakness (generalized): Secondary | ICD-10-CM | POA: Diagnosis not present

## 2016-06-25 DIAGNOSIS — R2689 Other abnormalities of gait and mobility: Secondary | ICD-10-CM | POA: Diagnosis not present

## 2016-06-25 DIAGNOSIS — R7303 Prediabetes: Secondary | ICD-10-CM | POA: Diagnosis not present

## 2016-06-25 DIAGNOSIS — N390 Urinary tract infection, site not specified: Secondary | ICD-10-CM | POA: Diagnosis not present

## 2016-06-28 DIAGNOSIS — N390 Urinary tract infection, site not specified: Secondary | ICD-10-CM | POA: Diagnosis not present

## 2016-06-28 DIAGNOSIS — N185 Chronic kidney disease, stage 5: Secondary | ICD-10-CM | POA: Diagnosis not present

## 2016-06-28 DIAGNOSIS — I120 Hypertensive chronic kidney disease with stage 5 chronic kidney disease or end stage renal disease: Secondary | ICD-10-CM | POA: Diagnosis not present

## 2016-06-28 DIAGNOSIS — R7303 Prediabetes: Secondary | ICD-10-CM | POA: Diagnosis not present

## 2016-06-28 DIAGNOSIS — R2689 Other abnormalities of gait and mobility: Secondary | ICD-10-CM | POA: Diagnosis not present

## 2016-06-28 DIAGNOSIS — M6281 Muscle weakness (generalized): Secondary | ICD-10-CM | POA: Diagnosis not present

## 2016-07-01 DIAGNOSIS — N390 Urinary tract infection, site not specified: Secondary | ICD-10-CM | POA: Diagnosis not present

## 2016-07-01 DIAGNOSIS — I120 Hypertensive chronic kidney disease with stage 5 chronic kidney disease or end stage renal disease: Secondary | ICD-10-CM | POA: Diagnosis not present

## 2016-07-01 DIAGNOSIS — R7303 Prediabetes: Secondary | ICD-10-CM | POA: Diagnosis not present

## 2016-07-01 DIAGNOSIS — N185 Chronic kidney disease, stage 5: Secondary | ICD-10-CM | POA: Diagnosis not present

## 2016-07-01 DIAGNOSIS — R2689 Other abnormalities of gait and mobility: Secondary | ICD-10-CM | POA: Diagnosis not present

## 2016-07-01 DIAGNOSIS — M6281 Muscle weakness (generalized): Secondary | ICD-10-CM | POA: Diagnosis not present

## 2016-07-06 DIAGNOSIS — N185 Chronic kidney disease, stage 5: Secondary | ICD-10-CM | POA: Diagnosis not present

## 2016-07-07 DIAGNOSIS — M6281 Muscle weakness (generalized): Secondary | ICD-10-CM | POA: Diagnosis not present

## 2016-07-07 DIAGNOSIS — R7303 Prediabetes: Secondary | ICD-10-CM | POA: Diagnosis not present

## 2016-07-07 DIAGNOSIS — N390 Urinary tract infection, site not specified: Secondary | ICD-10-CM | POA: Diagnosis not present

## 2016-07-07 DIAGNOSIS — I120 Hypertensive chronic kidney disease with stage 5 chronic kidney disease or end stage renal disease: Secondary | ICD-10-CM | POA: Diagnosis not present

## 2016-07-07 DIAGNOSIS — N185 Chronic kidney disease, stage 5: Secondary | ICD-10-CM | POA: Diagnosis not present

## 2016-07-07 DIAGNOSIS — R2689 Other abnormalities of gait and mobility: Secondary | ICD-10-CM | POA: Diagnosis not present

## 2016-07-08 DIAGNOSIS — R2689 Other abnormalities of gait and mobility: Secondary | ICD-10-CM | POA: Diagnosis not present

## 2016-07-08 DIAGNOSIS — R7303 Prediabetes: Secondary | ICD-10-CM | POA: Diagnosis not present

## 2016-07-08 DIAGNOSIS — M6281 Muscle weakness (generalized): Secondary | ICD-10-CM | POA: Diagnosis not present

## 2016-07-08 DIAGNOSIS — N185 Chronic kidney disease, stage 5: Secondary | ICD-10-CM | POA: Diagnosis not present

## 2016-07-08 DIAGNOSIS — I120 Hypertensive chronic kidney disease with stage 5 chronic kidney disease or end stage renal disease: Secondary | ICD-10-CM | POA: Diagnosis not present

## 2016-07-08 DIAGNOSIS — N390 Urinary tract infection, site not specified: Secondary | ICD-10-CM | POA: Diagnosis not present

## 2016-07-12 DIAGNOSIS — I120 Hypertensive chronic kidney disease with stage 5 chronic kidney disease or end stage renal disease: Secondary | ICD-10-CM | POA: Diagnosis not present

## 2016-07-12 DIAGNOSIS — N185 Chronic kidney disease, stage 5: Secondary | ICD-10-CM | POA: Diagnosis not present

## 2016-07-12 DIAGNOSIS — R7303 Prediabetes: Secondary | ICD-10-CM | POA: Diagnosis not present

## 2016-07-12 DIAGNOSIS — R2689 Other abnormalities of gait and mobility: Secondary | ICD-10-CM | POA: Diagnosis not present

## 2016-07-12 DIAGNOSIS — N390 Urinary tract infection, site not specified: Secondary | ICD-10-CM | POA: Diagnosis not present

## 2016-07-12 DIAGNOSIS — M6281 Muscle weakness (generalized): Secondary | ICD-10-CM | POA: Diagnosis not present

## 2016-07-15 DIAGNOSIS — R2689 Other abnormalities of gait and mobility: Secondary | ICD-10-CM | POA: Diagnosis not present

## 2016-07-15 DIAGNOSIS — N390 Urinary tract infection, site not specified: Secondary | ICD-10-CM | POA: Diagnosis not present

## 2016-07-15 DIAGNOSIS — R7303 Prediabetes: Secondary | ICD-10-CM | POA: Diagnosis not present

## 2016-07-15 DIAGNOSIS — N185 Chronic kidney disease, stage 5: Secondary | ICD-10-CM | POA: Diagnosis not present

## 2016-07-15 DIAGNOSIS — M6281 Muscle weakness (generalized): Secondary | ICD-10-CM | POA: Diagnosis not present

## 2016-07-15 DIAGNOSIS — I120 Hypertensive chronic kidney disease with stage 5 chronic kidney disease or end stage renal disease: Secondary | ICD-10-CM | POA: Diagnosis not present

## 2016-08-10 DIAGNOSIS — I10 Essential (primary) hypertension: Secondary | ICD-10-CM | POA: Diagnosis not present

## 2016-08-10 DIAGNOSIS — E8770 Fluid overload, unspecified: Secondary | ICD-10-CM | POA: Diagnosis not present

## 2016-08-10 DIAGNOSIS — Z23 Encounter for immunization: Secondary | ICD-10-CM | POA: Diagnosis not present

## 2016-08-10 DIAGNOSIS — N185 Chronic kidney disease, stage 5: Secondary | ICD-10-CM | POA: Diagnosis not present

## 2016-11-24 DIAGNOSIS — E8770 Fluid overload, unspecified: Secondary | ICD-10-CM | POA: Diagnosis not present

## 2016-11-24 DIAGNOSIS — Z6827 Body mass index (BMI) 27.0-27.9, adult: Secondary | ICD-10-CM | POA: Diagnosis not present

## 2016-11-24 DIAGNOSIS — N185 Chronic kidney disease, stage 5: Secondary | ICD-10-CM | POA: Diagnosis not present

## 2016-11-24 DIAGNOSIS — I10 Essential (primary) hypertension: Secondary | ICD-10-CM | POA: Diagnosis not present

## 2017-03-02 DIAGNOSIS — Z6826 Body mass index (BMI) 26.0-26.9, adult: Secondary | ICD-10-CM | POA: Diagnosis not present

## 2017-03-02 DIAGNOSIS — N185 Chronic kidney disease, stage 5: Secondary | ICD-10-CM | POA: Diagnosis not present

## 2017-03-02 DIAGNOSIS — I10 Essential (primary) hypertension: Secondary | ICD-10-CM | POA: Diagnosis not present

## 2017-03-02 DIAGNOSIS — E8770 Fluid overload, unspecified: Secondary | ICD-10-CM | POA: Diagnosis not present

## 2017-03-16 ENCOUNTER — Emergency Department (HOSPITAL_COMMUNITY)
Admission: EM | Admit: 2017-03-16 | Discharge: 2017-03-16 | Disposition: A | Discharge status: Home / Self Care | Payer: Medicare Other | Attending: Emergency Medicine | Admitting: Emergency Medicine

## 2017-03-16 ENCOUNTER — Emergency Department (HOSPITAL_COMMUNITY): Payer: Medicare Other

## 2017-03-16 DIAGNOSIS — Z79899 Other long term (current) drug therapy: Secondary | ICD-10-CM | POA: Insufficient documentation

## 2017-03-16 DIAGNOSIS — J989 Respiratory disorder, unspecified: Secondary | ICD-10-CM | POA: Diagnosis not present

## 2017-03-16 DIAGNOSIS — N189 Chronic kidney disease, unspecified: Secondary | ICD-10-CM | POA: Diagnosis not present

## 2017-03-16 DIAGNOSIS — J189 Pneumonia, unspecified organism: Secondary | ICD-10-CM

## 2017-03-16 DIAGNOSIS — Z87891 Personal history of nicotine dependence: Secondary | ICD-10-CM | POA: Insufficient documentation

## 2017-03-16 DIAGNOSIS — R0602 Shortness of breath: Secondary | ICD-10-CM | POA: Diagnosis not present

## 2017-03-16 DIAGNOSIS — R05 Cough: Secondary | ICD-10-CM | POA: Diagnosis not present

## 2017-03-16 DIAGNOSIS — J181 Lobar pneumonia, unspecified organism: Secondary | ICD-10-CM | POA: Diagnosis not present

## 2017-03-16 DIAGNOSIS — I129 Hypertensive chronic kidney disease with stage 1 through stage 4 chronic kidney disease, or unspecified chronic kidney disease: Secondary | ICD-10-CM | POA: Diagnosis not present

## 2017-03-16 LAB — BASIC METABOLIC PANEL
Anion gap: 16 — ABNORMAL HIGH (ref 5–15)
BUN: 105 mg/dL — AB (ref 6–20)
CALCIUM: 8.4 mg/dL — AB (ref 8.9–10.3)
CO2: 17 mmol/L — ABNORMAL LOW (ref 22–32)
CREATININE: 8.25 mg/dL — AB (ref 0.61–1.24)
Chloride: 101 mmol/L (ref 101–111)
GFR calc Af Amer: 6 mL/min — ABNORMAL LOW (ref >60)
GFR, EST NON AFRICAN AMERICAN: 6 mL/min — AB (ref >60)
Glucose, Bld: 146 mg/dL — ABNORMAL HIGH (ref 65–99)
POTASSIUM: 4.8 mmol/L (ref 3.5–5.1)
SODIUM: 134 mmol/L — AB (ref 135–145)

## 2017-03-16 LAB — CBC WITH DIFFERENTIAL/PLATELET
Basophils Absolute: 0 10*3/uL (ref 0.0–0.1)
Basophils Relative: 0 %
EOS PCT: 0 %
Eosinophils Absolute: 0 10*3/uL (ref 0.0–0.7)
HCT: 25.7 % — ABNORMAL LOW (ref 39.0–52.0)
Hemoglobin: 8.1 g/dL — ABNORMAL LOW (ref 13.0–17.0)
LYMPHS ABS: 0.8 10*3/uL (ref 0.7–4.0)
Lymphocytes Relative: 6 %
MCH: 31 pg (ref 26.0–34.0)
MCHC: 31.5 g/dL (ref 30.0–36.0)
MCV: 98.5 fL (ref 78.0–100.0)
MONO ABS: 0.8 10*3/uL (ref 0.1–1.0)
Monocytes Relative: 6 %
Neutro Abs: 11.2 10*3/uL — ABNORMAL HIGH (ref 1.7–7.7)
Neutrophils Relative %: 88 %
PLATELETS: 155 10*3/uL (ref 150–400)
RBC: 2.61 MIL/uL — AB (ref 4.22–5.81)
RDW: 15.2 % (ref 11.5–15.5)
WBC: 12.8 10*3/uL — AB (ref 4.0–10.5)

## 2017-03-16 LAB — BRAIN NATRIURETIC PEPTIDE: B Natriuretic Peptide: 680.7 pg/mL — ABNORMAL HIGH (ref 0.0–100.0)

## 2017-03-16 LAB — TROPONIN I

## 2017-03-16 MED ORDER — LEVOFLOXACIN 500 MG PO TABS: 500.0000 mg | ORAL_TABLET | ORAL | 0 refills | Status: DC

## 2017-03-16 MED ORDER — IPRATROPIUM-ALBUTEROL 0.5-2.5 (3) MG/3ML IN SOLN
3.0000 mL | Freq: Once | RESPIRATORY_TRACT | Status: AC
  Administered 2017-03-16: 3 mL via RESPIRATORY_TRACT
  Filled 2017-03-16: qty 3

## 2017-03-16 MED ORDER — LEVOFLOXACIN 750 MG PO TABS
750.0000 mg | ORAL_TABLET | Freq: Once | ORAL | Status: AC
  Administered 2017-03-16: 750 mg via ORAL
  Filled 2017-03-16: qty 1

## 2017-03-16 NOTE — ED Notes (Signed)
[facility] on the way to pick up pt.

## 2017-03-16 NOTE — ED Triage Notes (Signed)
Per EMS pt presents from home with complaint of increased chest congestion and shortness of breath over the last few days.  Pt has history of kidney failure and states he normally gets short of breath with exertion but this is worse.

## 2017-03-16 NOTE — Discharge Instructions (Signed)
Please take Levaquin every other day starting May 25.  If you have any worsening of your shortness of breath, began having chest pain, feel like you may pass out, start vomiting and cannot keep her medications down, please return to the hospital.

## 2017-03-16 NOTE — ED Provider Notes (Signed)
TIME SEEN: 2:31 AM  By signing my name below, I, Collene Leyden, attest that this documentation has been prepared under the direction and in the presence of Velora Horstman, Delice Bison, DO. Electronically Signed: Collene Leyden, Scribe. 03/16/17. 2:40 AM.  CHIEF COMPLAINT: Chest congestion  HPI:  Matthew Mays is a 76 y.o. 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 Patient states his chest congestion has increased today. Patient reports associated increased shortness of breath on exertion and a productive cough with white sputum. No modifying factors indicated. Patient has had a urostomy, in which he has had good urine output. Patient is currently taking furosemide 100 mg, he has not missed any doses. Patient states he does not use oxygen at home. Patient is a former smoker, quit 10 years ago. Patient denies any history of emphysema or COPD. Patient denies any fever, chills, nausea, vomiting, chest pain, diaphoresis, or any other symptoms.    ROS: See HPI Constitutional: no fever  Eyes: no drainage  ENT: no runny nose   Cardiovascular:  no chest pain  Resp: SOB  GI: no vomiting GU: no dysuria Integumentary: no rash  Allergy: no hives  Musculoskeletal: no leg swelling  Neurological: no slurred speech ROS otherwise negative  PAST MEDICAL HISTORY/PAST SURGICAL HISTORY:  Past Medical History:  Diagnosis Date  . Bladder cancer ([place])   . Chronic kidney disease   . H/O sinus tachycardia   . Hypertension   . Prediabetes   . Secondary hyperparathyroidism ([place])     MEDICATIONS:  Prior to Admission medications   Medication Sig Start Date End Date Taking? Authorizing Provider  calcium carbonate (TUMS EX) 750 MG chewable tablet Chew 4 tablets by mouth daily.     [provider]  dorzolamide (TRUSOPT) 2 %  ophthalmic solution Place 2 drops into the right eye daily.    [provider]  furosemide (LASIX) 40 MG tablet Take 40 mg by mouth daily.    [provider]  latanoprost (XALATAN) 0.005 % ophthalmic solution Place 1 drop into both eyes daily. Takes at 5 PM    [provider]  metoprolol (LOPRESSOR) 50 MG tablet Take 50 mg by mouth 2 (two) times daily.    [provider]  OVER THE COUNTER MEDICATION Take 1 tablet by mouth daily. Iron 65mg     [provider]  sodium bicarbonate 650 MG tablet Take 1,300 mg by mouth daily.     [provider]    ALLERGIES:  No Known Allergies  SOCIAL HISTORY:  Social History  Substance Use Topics  . Smoking status: Former Smoker    Packs/day: 2.00    Years: 40.00    Types: Cigarettes, Pipe    Quit date: 03/22/2006  . Smokeless tobacco: Never Used     Comment: Stopped smoking 10 years ago.  . Alcohol use 19.2 oz/week    7 Cans of beer, 25 Glasses of wine per week    FAMILY HISTORY: Family History  Problem Relation Age of Onset  . Cancer Mother        BRAIN    EXAM: There were no vitals taken for this visit. CONSTITUTIONAL: Alert and oriented and responds appropriately to questions. Elderly; Chronically ill appearing HEAD: Normocephalic EYES: Conjunctivae clear, pupils appear equal, EOMI ENT: normal nose; moist mucous membranes NECK: Supple, no meningismus, no  nuchal rigidity, no LAD ; no JVD CARD: RRR; S1 and S2 appreciated; no murmurs, no clicks, no rubs, no gallops RESP: Normal chest excursion without splinting or tachypnea; breath sounds clear and equal bilaterally; no wheezes, diffuse rhonchorous breath sounds, no rales, no hypoxia or respiratory distress, speaking full sentences ABD/GI: Normal bowel sounds; non-distended; soft, non-tender, no rebound, no guarding, no peritoneal signs, no hepatosplenomegaly, Urostomy noted in the lower abdomen BACK:  The back appears normal and is  non-tender to palpation, there is no CVA tenderness EXT: Normal ROM in all joints; non-tender to palpation; no edema; normal capillary refill; no cyanosis, no calf tenderness or swelling; fistula on the left wrist with normal thrill; fistula in the left upper arm which he reports is nonfunctional, No tenderness, swelling, or erythema; 2+ left radial pulse  SKIN: Normal color for age and race; warm; no rash NEURO: Moves all extremities equally PSYCH: The patient's mood and manner are appropriate. Grooming and personal hygiene are appropriate.  MEDICAL DECISION MAKING: Patient here shortness of breath with exertion, productive cough, rhonchorous breath sounds. No other sign of volume overload on exam. Differential includes pneumonia, viral illness, pulmonary edema, COPD. We'll give breathing treatment for symptomatic relief. We'll obtain labs, chest x-ray. EKG shows no new ischemic abnormality.  ED PROGRESS: Patient's labs show leukocytosis. Hemoglobin 8.1 which is better than he has been recently. Creatinine 8.25 which is near his baseline. Troponin negative. BNP is 680. Again patient does not appear volume overloaded on exam. Chest x-ray shows emphysema changes with focal airspace disease in the right lower lung consistent with pneumonia. Have offered patient admission but patient declined stating he would like to go home. I feel this is reasonable given he is not hypoxic and in no respiratory distress. His breath sounds have improved with one breathing treatment. He has not been wheezing today. I do not feel he needs to go home on steroids. Discussed with pharmacist who recommended giving a dose of Levaquin 750 mg now and then discharging her with prescription for Levaquin 500 mg to take every other day for total of 10 days starting on May 25. Discussed this with patient he was calmed with this plan. Discussed at length return precautions. He has a PCP for follow-up.  At this time, I do not feel there is  any life-threatening condition present. I have reviewed and discussed all results (EKG, imaging, lab, urine as appropriate) and exam findings with patient/family. I have reviewed nursing notes and appropriate previous records.  I feel the patient is safe to be discharged home without further emergent workup and can continue workup as an outpatient as needed. Discussed usual and customary return precautions. Patient/family verbalize understanding and are comfortable with this plan.  Outpatient follow-up has been provided if needed. All questions have been answered.    EKG Interpretation  Date/Time:  Wednesday Mar 16 2017 02:34:05 EDT Ventricular Rate:  89 PR Interval:    QRS Duration: 146 QT Interval:  418 QTC Calculation: 509 R Axis:   73 Text Interpretation:  Sinus rhythm Multiple ventricular premature complexes Right bundle branch block No significant change since last tracing other than rate is slower Confirmed by Lynk Marti,  DO, Murvin Gift ([id number]) on 03/16/2017 2:44:50 AM        I personally performed the services described in this documentation, which was scribed in my presence. The recorded information has been reviewed and is accurate.    Matthew Mays, Delice Bison, DO 03/16/17 [phone]

## 2017-07-21 ENCOUNTER — Encounter (HOSPITAL_COMMUNITY): Payer: Self-pay | Admitting: Pharmacy Technician

## 2017-07-21 ENCOUNTER — Inpatient Hospital Stay (HOSPITAL_COMMUNITY)
Admission: EM | Admit: 2017-07-21 | Discharge: 2017-07-23 | DRG: 682 | Disposition: A | Discharge status: Home / Self Care | Payer: Medicare Other | Attending: Internal Medicine | Admitting: Internal Medicine

## 2017-07-21 ENCOUNTER — Emergency Department (HOSPITAL_COMMUNITY): Payer: Medicare Other

## 2017-07-21 DIAGNOSIS — D649 Anemia, unspecified: Secondary | ICD-10-CM | POA: Diagnosis not present

## 2017-07-21 DIAGNOSIS — N2581 Secondary hyperparathyroidism of renal origin: Secondary | ICD-10-CM | POA: Diagnosis not present

## 2017-07-21 DIAGNOSIS — H409 Unspecified glaucoma: Secondary | ICD-10-CM | POA: Diagnosis present

## 2017-07-21 DIAGNOSIS — Z23 Encounter for immunization: Secondary | ICD-10-CM

## 2017-07-21 DIAGNOSIS — R778 Other specified abnormalities of plasma proteins: Secondary | ICD-10-CM

## 2017-07-21 DIAGNOSIS — F419 Anxiety disorder, unspecified: Secondary | ICD-10-CM | POA: Diagnosis not present

## 2017-07-21 DIAGNOSIS — R748 Abnormal levels of other serum enzymes: Secondary | ICD-10-CM | POA: Diagnosis not present

## 2017-07-21 DIAGNOSIS — I120 Hypertensive chronic kidney disease with stage 5 chronic kidney disease or end stage renal disease: Principal | ICD-10-CM | POA: Diagnosis present

## 2017-07-21 DIAGNOSIS — N186 End stage renal disease: Secondary | ICD-10-CM | POA: Diagnosis present

## 2017-07-21 DIAGNOSIS — D631 Anemia in chronic kidney disease: Secondary | ICD-10-CM | POA: Diagnosis not present

## 2017-07-21 DIAGNOSIS — N185 Chronic kidney disease, stage 5: Secondary | ICD-10-CM | POA: Diagnosis not present

## 2017-07-21 DIAGNOSIS — E872 Acidosis: Secondary | ICD-10-CM | POA: Diagnosis not present

## 2017-07-21 DIAGNOSIS — N139 Obstructive and reflux uropathy, unspecified: Secondary | ICD-10-CM | POA: Diagnosis present

## 2017-07-21 DIAGNOSIS — E871 Hypo-osmolality and hyponatremia: Secondary | ICD-10-CM

## 2017-07-21 DIAGNOSIS — D591 Other autoimmune hemolytic anemias: Secondary | ICD-10-CM | POA: Diagnosis not present

## 2017-07-21 DIAGNOSIS — R079 Chest pain, unspecified: Secondary | ICD-10-CM | POA: Diagnosis not present

## 2017-07-21 DIAGNOSIS — R0789 Other chest pain: Secondary | ICD-10-CM

## 2017-07-21 DIAGNOSIS — E875 Hyperkalemia: Secondary | ICD-10-CM | POA: Diagnosis not present

## 2017-07-21 DIAGNOSIS — Z8551 Personal history of malignant neoplasm of bladder: Secondary | ICD-10-CM | POA: Diagnosis not present

## 2017-07-21 DIAGNOSIS — R197 Diarrhea, unspecified: Secondary | ICD-10-CM | POA: Diagnosis not present

## 2017-07-21 DIAGNOSIS — Z87891 Personal history of nicotine dependence: Secondary | ICD-10-CM | POA: Diagnosis not present

## 2017-07-21 DIAGNOSIS — R072 Precordial pain: Secondary | ICD-10-CM

## 2017-07-21 DIAGNOSIS — Z79899 Other long term (current) drug therapy: Secondary | ICD-10-CM

## 2017-07-21 DIAGNOSIS — R05 Cough: Secondary | ICD-10-CM | POA: Diagnosis not present

## 2017-07-21 DIAGNOSIS — R7303 Prediabetes: Secondary | ICD-10-CM | POA: Diagnosis not present

## 2017-07-21 DIAGNOSIS — R7989 Other specified abnormal findings of blood chemistry: Secondary | ICD-10-CM

## 2017-07-21 DIAGNOSIS — I10 Essential (primary) hypertension: Secondary | ICD-10-CM

## 2017-07-21 LAB — BASIC METABOLIC PANEL
Anion gap: 19 — ABNORMAL HIGH (ref 5–15)
BUN: 105 mg/dL — AB (ref 6–20)
CHLORIDE: 97 mmol/L — AB (ref 101–111)
CO2: 17 mmol/L — AB (ref 22–32)
Calcium: 8.8 mg/dL — ABNORMAL LOW (ref 8.9–10.3)
Creatinine, Ser: 7.8 mg/dL — ABNORMAL HIGH (ref 0.61–1.24)
GFR calc Af Amer: 7 mL/min — ABNORMAL LOW (ref >60)
GFR calc non Af Amer: 6 mL/min — ABNORMAL LOW (ref >60)
Glucose, Bld: 189 mg/dL — ABNORMAL HIGH (ref 65–99)
POTASSIUM: 5.2 mmol/L — AB (ref 3.5–5.1)
SODIUM: 133 mmol/L — AB (ref 135–145)

## 2017-07-21 LAB — CBC
HCT: 19.1 % — ABNORMAL LOW (ref 39.0–52.0)
HEMATOCRIT: 20.2 % — AB (ref 39.0–52.0)
Hemoglobin: 6.4 g/dL — CL (ref 13.0–17.0)
Hemoglobin: 6.2 g/dL — CL (ref 13.0–17.0)
MCH: 30.8 pg (ref 26.0–34.0)
MCH: 31 pg (ref 26.0–34.0)
MCHC: 31.7 g/dL (ref 30.0–36.0)
MCHC: 32.5 g/dL (ref 30.0–36.0)
MCV: 97.1 fL (ref 78.0–100.0)
MCV: 95.5 fL (ref 78.0–100.0)
PLATELETS: 181 10*3/uL (ref 150–400)
Platelets: 199 10*3/uL (ref 150–400)
RBC: 2.08 MIL/uL — ABNORMAL LOW (ref 4.22–5.81)
RBC: 2 MIL/uL — ABNORMAL LOW (ref 4.22–5.81)
RDW: 15.7 % — AB (ref 11.5–15.5)
RDW: 15.8 % — AB (ref 11.5–15.5)
WBC: 10.4 10*3/uL (ref 4.0–10.5)
WBC: 9.1 10*3/uL (ref 4.0–10.5)

## 2017-07-21 LAB — HEPATIC FUNCTION PANEL
ALBUMIN: 3.1 g/dL — AB (ref 3.5–5.0)
ALT: 10 U/L — ABNORMAL LOW (ref 17–63)
AST: 10 U/L — AB (ref 15–41)
Alkaline Phosphatase: 68 U/L (ref 38–126)
BILIRUBIN TOTAL: 0.6 mg/dL (ref 0.3–1.2)
Bilirubin, Direct: <0.1 mg/dL — ABNORMAL LOW (ref 0.1–0.5)
Total Protein: 6.8 g/dL (ref 6.5–8.1)

## 2017-07-21 LAB — I-STAT CHEM 8, ED
BUN: 102 mg/dL — ABNORMAL HIGH (ref 6–20)
CALCIUM ION: 1.07 mmol/L — AB (ref 1.15–1.40)
CHLORIDE: 103 mmol/L (ref 101–111)
Creatinine, Ser: 8.5 mg/dL — ABNORMAL HIGH (ref 0.61–1.24)
GLUCOSE: 119 mg/dL — AB (ref 65–99)
HEMATOCRIT: 17 % — AB (ref 39.0–52.0)
HEMOGLOBIN: 5.8 g/dL — AB (ref 13.0–17.0)
POTASSIUM: 5 mmol/L (ref 3.5–5.1)
SODIUM: 133 mmol/L — AB (ref 135–145)
TCO2: 18 mmol/L — ABNORMAL LOW (ref 22–32)

## 2017-07-21 LAB — IRON AND TIBC
IRON: 60 ug/dL (ref 45–182)
Iron: 61 ug/dL (ref 45–182)
SATURATION RATIOS: 25 % (ref 17.9–39.5)
SATURATION RATIOS: 25 % (ref 17.9–39.5)
TIBC: 245 ug/dL — AB (ref 250–450)
TIBC: 239 ug/dL — AB (ref 250–450)
UIBC: 179 ug/dL
UIBC: 184 ug/dL

## 2017-07-21 LAB — RETICULOCYTES
RBC.: 1.95 MIL/uL — AB (ref 4.22–5.81)
RETIC CT PCT: 4.7 % — AB (ref 0.4–3.1)
Retic Count, Absolute: 91.7 10*3/uL (ref 19.0–186.0)

## 2017-07-21 LAB — GLUCOSE, CAPILLARY: Glucose-Capillary: 96 mg/dL (ref 65–99)

## 2017-07-21 LAB — BRAIN NATRIURETIC PEPTIDE: B NATRIURETIC PEPTIDE 5: 761.6 pg/mL — AB (ref 0.0–100.0)

## 2017-07-21 LAB — PREPARE RBC (CROSSMATCH)

## 2017-07-21 LAB — FERRITIN: Ferritin: 324 ng/mL (ref 24–336)

## 2017-07-21 LAB — FOLATE: FOLATE: 10.3 ng/mL (ref >5.9)

## 2017-07-21 LAB — TROPONIN I: TROPONIN I: 0.07 ng/mL — AB (ref <0.03)

## 2017-07-21 LAB — POC OCCULT BLOOD, ED: Fecal Occult Bld: NEGATIVE

## 2017-07-21 LAB — VITAMIN B12: Vitamin B-12: 450 pg/mL (ref 180–914)

## 2017-07-21 LAB — I-STAT TROPONIN, ED: Troponin i, poc: 0.04 ng/mL (ref 0.00–0.08)

## 2017-07-21 LAB — LIPASE, BLOOD: Lipase: 64 U/L — ABNORMAL HIGH (ref 11–51)

## 2017-07-21 LAB — ETHANOL

## 2017-07-21 LAB — CBG MONITORING, ED: GLUCOSE-CAPILLARY: 104 mg/dL — AB (ref 65–99)

## 2017-07-21 MED ORDER — DARBEPOETIN ALFA 200 MCG/0.4ML IJ SOSY
200.0000 ug | PREFILLED_SYRINGE | INTRAMUSCULAR | Status: DC
  Administered 2017-07-22: 200 ug via SUBCUTANEOUS
  Filled 2017-07-21 (×2): qty 0.4

## 2017-07-21 MED ORDER — FUROSEMIDE 20 MG PO TABS
40.0000 mg | ORAL_TABLET | Freq: Every day | ORAL | Status: DC
  Administered 2017-07-21 – 2017-07-23 (×2): 40 mg via ORAL
  Filled 2017-07-21 (×3): qty 2

## 2017-07-21 MED ORDER — ONDANSETRON HCL 4 MG/2ML IJ SOLN: 4.0000 mg | Freq: Four times a day (QID) | INTRAMUSCULAR | Status: DC | PRN

## 2017-07-21 MED ORDER — NITROGLYCERIN 0.4 MG SL SUBL: 0.4000 mg | SUBLINGUAL_TABLET | SUBLINGUAL | Status: DC | PRN

## 2017-07-21 MED ORDER — SODIUM BICARBONATE 650 MG PO TABS: 1300.0000 mg | ORAL_TABLET | Freq: Every day | ORAL | Status: DC

## 2017-07-21 MED ORDER — INSULIN ASPART 100 UNIT/ML ~~LOC~~ SOLN
0.0000 [IU] | Freq: Three times a day (TID) | SUBCUTANEOUS | Status: DC
  Administered 2017-07-23 (×2): 2 [IU] via SUBCUTANEOUS

## 2017-07-21 MED ORDER — ACETAMINOPHEN 325 MG PO TABS: 650.0000 mg | ORAL_TABLET | Freq: Four times a day (QID) | ORAL | Status: DC | PRN

## 2017-07-21 MED ORDER — ALBUTEROL SULFATE (2.5 MG/3ML) 0.083% IN NEBU: 2.5000 mg | INHALATION_SOLUTION | RESPIRATORY_TRACT | Status: DC | PRN

## 2017-07-21 MED ORDER — ONDANSETRON HCL 4 MG PO TABS: 4.0000 mg | ORAL_TABLET | Freq: Four times a day (QID) | ORAL | Status: DC | PRN

## 2017-07-21 MED ORDER — DORZOLAMIDE HCL 2 % OP SOLN
2.0000 [drp] | Freq: Every day | OPHTHALMIC | Status: DC
  Administered 2017-07-21 – 2017-07-23 (×3): 2 [drp] via OPHTHALMIC
  Filled 2017-07-21: qty 10

## 2017-07-21 MED ORDER — LATANOPROST 0.005 % OP SOLN
1.0000 [drp] | Freq: Every day | OPHTHALMIC | Status: DC
  Administered 2017-07-21 – 2017-07-23 (×3): 1 [drp] via OPHTHALMIC
  Filled 2017-07-21: qty 2.5

## 2017-07-21 MED ORDER — SODIUM CHLORIDE 0.9 % IV SOLN: 10.0000 mL/h | Freq: Once | INTRAVENOUS | Status: DC

## 2017-07-21 MED ORDER — METOPROLOL TARTRATE 50 MG PO TABS
50.0000 mg | ORAL_TABLET | Freq: Two times a day (BID) | ORAL | Status: DC
  Administered 2017-07-21 – 2017-07-23 (×4): 50 mg via ORAL
  Filled 2017-07-21 (×4): qty 1

## 2017-07-21 MED ORDER — SODIUM BICARBONATE 650 MG PO TABS
1300.0000 mg | ORAL_TABLET | Freq: Three times a day (TID) | ORAL | Status: DC
  Administered 2017-07-21 – 2017-07-23 (×6): 1300 mg via ORAL
  Filled 2017-07-21 (×5): qty 2

## 2017-07-21 MED ORDER — ACETAMINOPHEN 650 MG RE SUPP: 650.0000 mg | Freq: Four times a day (QID) | RECTAL | Status: DC | PRN

## 2017-07-21 NOTE — ED Notes (Signed)
Chem 8 results given to Dr. Darl Householder by B. Yolanda Bonine, EMT  Hb 5.8

## 2017-07-21 NOTE — ED Provider Notes (Signed)
[facility] DEPT Provider Note   CSN: [id number] Arrival date & time: 07/21/17  1408     History   Chief Complaint Chief Complaint  Patient presents with  . Chest Pain    HPI Matthew Mays is a 76 y.o. male.  HPI Patient reports that he has been getting intermittent chest pain for about a week. Ports that the pressure quality that occurs through his anterior chest. It waxes and wanes. It may last approximately 20 minutes or so. Patient reports that it does come on with activities such as trying to take out the garbage. He reports he became most concerned today because just in the light activities of trying to make some lunch she started to get chest pressure. He reports normally he will have some wine and it makes it go away. He reports today it didn't help and he is starting to get more concerned about the recurrence of it. He reports on average she drinks about 4 glasses of wine per day. Patient ports he does have a history of smoking which he quit about 11 years ago. He reports he's had a chronic cough. He denies any blood or discoloration the mucus. No fever. He denies any significant increase in cough from what he considers baseline. Patient reports off and he is anemic and sometimes that also makes other symptoms worse. Patient has history of bladder cancer. He reports he had bladder and urogenital resection approximately 11 years ago. Never had a recurrence of cancer. Past Medical History:  Diagnosis Date  . Bladder cancer ([place])   . Chronic kidney disease   . H/O sinus tachycardia   . Hypertension   . Prediabetes   . Secondary hyperparathyroidism [facility])     Patient Active Problem List   Diagnosis Date Noted  . UTI (lower urinary tract infection) 06/19/2016  . Sepsis ([place]) 06/19/2016  . Hyperglycemia 06/19/2016  . Hypertension 06/19/2016  . AKI (acute kidney injury) ([place]) 06/19/2016  . CKD (chronic kidney disease), stage V ([place]) 06/19/2016  . End stage renal disease ([place])  03/22/2012    Past Surgical History:  Procedure Laterality Date  . ARTERIOVENOUS GRAFT PLACEMENT     left arm AVG  . AV FISTULA PLACEMENT  03/30/2012   Procedure: ARTERIOVENOUS (AV) FISTULA CREATION;  Surgeon: Angelia Mould, MD;  Location: [facility];  Service: Vascular;  Laterality: Left;  . CYSTECTOMY     w/ ileal diversion for bladder cancer   . TONSILLECTOMY         Home Medications    Prior to Admission medications   Medication Sig Start Date End Date Taking? Authorizing Provider  calcium carbonate (TUMS EX) 750 MG chewable tablet Chew 4 tablets by mouth daily.     [provider]  dorzolamide (TRUSOPT) 2 % ophthalmic solution Place 2 drops into the right eye daily.    [provider]  furosemide (LASIX) 40 MG tablet Take 40 mg by mouth daily.    [provider]  latanoprost (XALATAN) 0.005 % ophthalmic solution Place 1 drop into both eyes daily. Takes at 5 PM    [provider]  levofloxacin (LEVAQUIN) 500 MG tablet Take 1 tablet (500 mg total) by mouth every other day. Start on 03/18/17 03/16/17   Ward, Delice Bison, DO  metoprolol (LOPRESSOR) 50 MG tablet Take 50 mg by mouth 2 (two) times daily.    [provider]  OVER THE COUNTER MEDICATION Take 1 tablet by mouth daily. Iron 65mg   [provider]  sodium bicarbonate 650 MG tablet Take 1,300 mg by mouth daily.     [provider]    Family History Family History  Problem Relation Age of Onset  . Cancer Mother        BRAIN    Social History Social History  Substance Use Topics  . Smoking status: Former Smoker    Packs/day: 2.00    Years: 40.00    Types: Cigarettes, Pipe    Quit date: 03/22/2006  . Smokeless tobacco: Never Used     Comment: Stopped smoking 10 years ago.  . Alcohol use 19.2 oz/week    7 Cans of beer, 25 Glasses of wine per week     Allergies   Patient has no known allergies.   Review of Systems Review of Systems 10 Systems  reviewed and are negative for acute change except as noted in the HPI.   Physical Exam Updated Vital Signs BP (!) 145/67   Pulse 91   Temp 98.7 F (37.1 C) (Oral)   Resp (!) 24   Ht 5\' 10"  (1.778 m)   Wt 74.8 kg (165 lb)   SpO2 100%   BMI 23.68 kg/m   Physical Exam  Constitutional: He is oriented to person, place, and time.  Patient is alert and nontoxic. He is deconditioned in appearance. Pale. No respiratory distress at rest.  HENT:  Head: Normocephalic and atraumatic.  Nose: Nose normal.  Mouth/Throat: Oropharynx is clear and moist.  Eyes: EOM are normal.  Neck: Neck supple.  Cardiovascular: Normal rate and regular rhythm.   3\6 systolic ejection murmur.  Pulmonary/Chest: Effort normal and breath sounds normal. No respiratory distress.  Abdominal: Soft. He exhibits no distension. There is no tenderness. There is guarding.  Musculoskeletal: Normal range of motion. He exhibits no edema or tenderness.  Neurological: He is alert and oriented to person, place, and time. No cranial nerve deficit. He exhibits normal muscle tone. Coordination normal.  Skin: Skin is warm and dry. There is pallor.     ED Treatments / Results  Labs (all labs ordered are listed, but only abnormal results are displayed) Labs Reviewed  BASIC METABOLIC PANEL - Abnormal; Notable for the following:       Result Value   Sodium 133 (*)    Potassium 5.2 (*)    Chloride 97 (*)    CO2 17 (*)    Glucose, Bld 189 (*)    BUN 105 (*)    Creatinine, Ser 7.80 (*)    Calcium 8.8 (*)    GFR calc non Af Amer 6 (*)    GFR calc Af Amer 7 (*)    Anion gap 19 (*)    All other components within normal limits  CBC - Abnormal; Notable for the following:    RBC 2.08 (*)    Hemoglobin 6.4 (*)    HCT 20.2 (*)    RDW 15.7 (*)    All other components within normal limits  BRAIN NATRIURETIC PEPTIDE  ETHANOL  HEPATIC FUNCTION PANEL  LIPASE, BLOOD  HEMOGLOBIN AND HEMATOCRIT, BLOOD  I-STAT TROPONIN, ED  POC  OCCULT BLOOD, ED  TYPE AND SCREEN  PREPARE RBC (CROSSMATCH)    EKG  EKG Interpretation  Date/Time:  Thursday July 21 2017 14:20:43 EDT Ventricular Rate:  88 PR Interval:    QRS Duration: 147 QT Interval:  375 QTC Calculation: 454 R Axis:   54 Text Interpretation:  Sinus rhythm Multiple ventricular premature complexes Right  bundle branch block Repol abnrm suggests ischemia, diffuse leads Confirmed by Charlesetta Shanks [phone]) on 07/21/2017 2:52:03 PM       Radiology Dg Chest 2 View  Result Date: 07/21/2017 CLINICAL DATA:  Chest pain.  Productive cough. EXAM: CHEST  2 VIEW COMPARISON:  03/16/2017 .  06/19/2016 . FINDINGS: Mediastinum and hilar structures normal. Cardiomegaly with normal pulmonary vascularity. Mild infiltrate right lung base again noted. No pleural effusion or pneumothorax. IMPRESSION: Mild right base pulmonary infiltrate again noted. Mild pneumonia cannot be excluded . Electronically Signed   By: Marcello Moores  Register   On: 07/21/2017 15:07    Procedures Procedures (including critical care time) CRITICAL CARE Performed by: Charlesetta Shanks   Total critical care time: 30 minutes  Critical care time was exclusive of separately billable procedures and treating other patients.  Critical care was necessary to treat or prevent imminent or life-threatening deterioration.  Critical care was time spent personally by me on the following activities: development of treatment plan with patient and/or surrogate as well as nursing, discussions with consultants, evaluation of patient's response to treatment, examination of patient, obtaining history from patient or surrogate, ordering and performing treatments and interventions, ordering and review of laboratory studies, ordering and review of radiographic studies, pulse oximetry and re-evaluation of patient's condition. Medications Ordered in ED Medications  0.9 %  sodium chloride infusion (not administered)     Initial  Impression / Assessment and Plan / ED Course  I have reviewed the triage vital signs and the nursing notes.  Pertinent labs & imaging results that were available during my care of the patient were reviewed by me and considered in my medical decision making (see chart for details).     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 Darl Householder to assess and review pending labs. Patient is anemic with chest pain. I suspect demand ischemia plan will be to recheck CBC and confirm anemia with plan to transfuse and admit. Patient is alert and appropriate. He has no respiratory distress at rest. Not having active chest pain. New Prescriptions New Prescriptions   No medications on file     Charlesetta Shanks, MD 07/21/17 [phone]

## 2017-07-21 NOTE — ED Notes (Signed)
Patient transported to X-ray 

## 2017-07-21 NOTE — Consult Note (Signed)
Reason for Consult:Anemia,CKD 5 Referring Physician: Dr. Myra Gianotti R Matthew Mays is an 76 y.o. male.  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  Cr in 5/18 7.6, now 7.8 -8.5 ( no signif GFR diff).  Here with progressive weakness and intermittent CP for over a week. Now lasting longer and not going away.  Appetite is not good, and not resting well. Hb 6.4 and 5.8 on 2 different determinations.  In past had been on ESA, but Hb had been hovering in 10s.  In past year, Urosepsis and pneu. Failed UA and LA AVFs on L.    Repeatedly refused to start dialysis and does again.  Says that will never dialyze but may call when gets sick. Constitutional: relatively poor appetite, not real active Eyes: glasses Ears, nose, mouth, throat, and face: HA Respiratory: negative Cardiovascular: negative Gastrointestinal: diarrhea Genitourinary:bag, urine clear Integument/breast: dry Hematologic/lymphatic: as above Musculoskeletal:negative Endocrine: Pre DM Allergic/Immunologic: negative    Primary Nephrologist [place]. .   Past Medical History:  Diagnosis Date  . Bladder cancer ([place])   . Chronic kidney disease   . H/O sinus tachycardia   . Hypertension   . Prediabetes   . Secondary hyperparathyroidism [facility])     Past Surgical History:  Procedure Laterality Date  . ARTERIOVENOUS GRAFT PLACEMENT     left arm AVG  . AV FISTULA PLACEMENT  03/30/2012   Procedure: ARTERIOVENOUS (AV) FISTULA CREATION;  Surgeon: Angelia Mould, MD;  Location: [place];  Service: Vascular;  Laterality: Left;  . CYSTECTOMY     w/ ileal diversion for bladder cancer   . TONSILLECTOMY      Family History  Problem Relation Age of Onset  . Cancer Mother        BRAIN    Social History:  reports that he quit smoking about 11 years ago. His smoking use included Cigarettes and Pipe. He has a 80.00 pack-year smoking history. He has never  used smokeless tobacco. He reports that he drinks about 19.2 oz of alcohol per week . He reports that he does not use drugs.  Allergies: No Known Allergies  Medications: I have reviewed the patient's current medications. Prior to Admission:  (Not in a hospital admission)  Results for orders placed or performed during the hospital encounter of 07/21/17 (from the past 48 hour(s))  Basic metabolic panel     Status: Abnormal   Collection Time: 07/21/17  2:16 PM  Result Value Ref Range   Sodium 133 (L) 135 - 145 mmol/L   Potassium 5.2 (H) 3.5 - 5.1 mmol/L   Chloride 97 (L) 101 - 111 mmol/L   CO2 17 (L) 22 - 32 mmol/L   Glucose, Bld 189 (H) 65 - 99 mg/dL   BUN 105 (H) 6 - 20 mg/dL   Creatinine, Ser 7.80 (H) 0.61 - 1.24 mg/dL   Calcium 8.8 (L) 8.9 - 10.3 mg/dL   GFR calc non Af Amer 6 (L) >60 mL/min   GFR calc Af Amer 7 (L) >60 mL/min    Comment: (NOTE) The eGFR has been calculated using the CKD EPI equation. This calculation has not been validated in all clinical situations. eGFR's persistently <60 mL/min signify possible Chronic Kidney Disease.    Anion gap 19 (H) 5 - 15  CBC     Status: Abnormal   Collection Time: 07/21/17  2:16 PM  Result Value Ref Range   WBC  10.4 4.0 - 10.5 K/uL   RBC 2.08 (L) 4.22 - 5.81 MIL/uL   Hemoglobin 6.4 (LL) 13.0 - 17.0 g/dL    Comment: REPEATED TO VERIFY CRITICAL RESULT CALLED TO, READ BACK BY AND VERIFIED WITH: C. BAIN RN AT [id number] ON [id number] BY. Renae Gloss.    HCT 20.2 (L) 39.0 - 52.0 %   MCV 97.1 78.0 - 100.0 fL   MCH 30.8 26.0 - 34.0 pg   MCHC 31.7 30.0 - 36.0 g/dL   RDW 15.7 (H) 11.5 - 15.5 %   Platelets 199 150 - 400 K/uL  Brain natriuretic peptide     Status: Abnormal   Collection Time: 07/21/17  2:16 PM  Result Value Ref Range   B Natriuretic Peptide 761.6 (H) 0.0 - 100.0 pg/mL  I-stat troponin, ED     Status: None   Collection Time: 07/21/17  2:30 PM  Result Value Ref Range   Troponin i, poc 0.04 0.00 - 0.08 ng/mL   Comment 3             Comment: Due to the release kinetics of cTnI, a negative result within the first hours of the onset of symptoms does not rule out myocardial infarction with certainty. If myocardial infarction is still suspected, repeat the test at appropriate intervals.   POC occult blood, ED Provider will collect     Status: None   Collection Time: 07/21/17  4:04 PM  Result Value Ref Range   Fecal Occult Bld NEGATIVE NEGATIVE  I-stat chem 8, ed     Status: Abnormal   Collection Time: 07/21/17  4:25 PM  Result Value Ref Range   Sodium 133 (L) 135 - 145 mmol/L   Potassium 5.0 3.5 - 5.1 mmol/L   Chloride 103 101 - 111 mmol/L   BUN 102 (H) 6 - 20 mg/dL   Creatinine, Ser 8.50 (H) 0.61 - 1.24 mg/dL   Glucose, Bld 119 (H) 65 - 99 mg/dL   Calcium, Ion 1.07 (L) 1.15 - 1.40 mmol/L   TCO2 18 (L) 22 - 32 mmol/L   Hemoglobin 5.8 (LL) 13.0 - 17.0 g/dL   HCT 17.0 (L) 39.0 - 52.0 %   Comment NOTIFIED PHYSICIAN     Dg Chest 2 View  Result Date: 07/21/2017 CLINICAL DATA:  Chest pain.  Productive cough. EXAM: CHEST  2 VIEW COMPARISON:  03/16/2017 .  06/19/2016 . FINDINGS: Mediastinum and hilar structures normal. Cardiomegaly with normal pulmonary vascularity. Mild infiltrate right lung base again noted. No pleural effusion or pneumothorax. IMPRESSION: Mild right base pulmonary infiltrate again noted. Mild pneumonia cannot be excluded . Electronically Signed   By: Marcello Moores  Register   On: 07/21/2017 15:07    ROS Blood pressure (!) 176/63, pulse 83, temperature 98.7 F (37.1 C), temperature source Oral, resp. rate (!) 22, height '5\' 10"'$  (1.778 m), weight 74.8 kg (165 lb), SpO2 100 %. Physical Exam Physical Examination: General appearance - sallow , chronic ill appearing Mental status - alert, oriented to person, place, and time Eyes - pupils equal and reactive, extraocular eye movements intact, funduscopic exam normal, discs flat and sharp Ears - bilateral TM's and external ear canals normal Mouth - pale  mucous membranes Neck - adenopathy noted PCL Lymphatics - posterior cervical nodes Chest - decreased bs, scattered rhonchi Heart - S1 and S2 normal, systolic murmur GD9/2 at 2nd left intercostal space Abdomen - urostomy RUQ, pos bs, obese Musculoskeletal - no joint tenderness, deformity or swelling Extremities - no pedal edema  noted Skin - dry, sallow , scaling  Assessment/Plan: 1 CKD 5 early uremia, acidemia, anemia. Refusing tx, will tx #s, and help with acidemia. Apparently only wants conservative mgmt but some waffling 2 CP prob anemia ?underlying CAD 3 Hypertension: not an issue 4. Anemia of ESRD: will check Fe and give ESA 5. Metabolic Bone Disease: will f/u outpatient 6 Hx bladder Ca P check Fe, ^ bicarb po, esa.  Will not follow formally at this time  Hasten Sweitzer,Abimelec L 07/21/2017, 5:01 PM

## 2017-07-21 NOTE — ED Provider Notes (Signed)
  Physical Exam  BP (!) 142/52   Pulse 88   Temp 98.7 F (37.1 C) (Oral)   Resp 15   Ht 5\' 10"  (1.778 m)   Wt 74.8 kg (165 lb)   SpO2 100%   BMI 23.68 kg/m   Physical Exam  ED Course  Procedures  MDM Care assumed at sign out from Dr. Dorothyann Gibbs. Patient has chest pain and shortness of breath for the last few days. Suppose to be on dialysis but has been refusing. Patient has dark stools when he takes iron supplements. Hg was 6.4, patient request redraw to check. Sign out pending repeat CBC and likely admission.   Hg repeat was 5.8. Ordered 2 U PRBC. Occ negative. Has renal failure and multiple electrolyte abnormalities. I called Dr. Archie Balboa who will discuss with him regarding dialysis. Ordered 2 U PRBC. Ordered anemia panel. Hospitalist to admit.   CRITICAL CARE Performed by: Wandra Arthurs   Total critical care time: 30 minutes  Critical care time was exclusive of separately billable procedures and treating other patients.  Critical care was necessary to treat or prevent imminent or life-threatening deterioration.  Critical care was time spent personally by me on the following activities: development of treatment plan with patient and/or surrogate as well as nursing, discussions with consultants, evaluation of patient's response to treatment, examination of patient, obtaining history from patient or surrogate, ordering and performing treatments and interventions, ordering and review of laboratory studies, ordering and review of radiographic studies, pulse oximetry and re-evaluation of patient's condition.      Drenda Freeze, MD 07/21/17 [phone]

## 2017-07-21 NOTE — H&P (Signed)
[facility] History and Physical  Kaz Auld Cloward FWY:[id number] DOB: 07-01-1941 DOA: 07/21/2017  Referring physician:  PCP: Estanislado Emms, MD   Chief Complaint: "I felt anxious."  HPI: Matthew Mays is a 76 y.o. male  with past medical history of chronic kidney disease, bladder cancer, hypertension, prediabetes and glaucoma presents to the emergency room with a chief complaint of chest discomfort. Patient states he has long history of chest discomfort that usually has at night and relieved with a glass of wine. He states that he thinks it's mostly anxiety. States that the wife helped with his anxiety. Denies heavy drinking. Denies chest trauma. No recent illness. Chest pain is resolved. Former smoker.  ED course: Patient found to have multiple electrolyte abnormalities. He is aware that he is a dialysis candidate but still refuses adamantly. Nephrology saw patient at bedside. Patient only wants blood transfusion. Wants no other interventions. Hospitalist consulted for admission.   Review of Systems:  As per HPI otherwise 10 point review of systems negative.    Past Medical History:  Diagnosis Date  . Bladder cancer ([place])   . Chronic kidney disease   . H/O sinus tachycardia   . Hypertension   . Prediabetes   . Secondary hyperparathyroidism [facility])    Past Surgical History:  Procedure Laterality Date  . ARTERIOVENOUS GRAFT PLACEMENT     left arm AVG  . AV FISTULA PLACEMENT  03/30/2012   Procedure: ARTERIOVENOUS (AV) FISTULA CREATION;  Surgeon: Angelia Mould, MD;  Location: [place];  Service: Vascular;  Laterality: Left;  . CYSTECTOMY     w/ ileal diversion for bladder cancer   . TONSILLECTOMY     Social History:  reports that he quit smoking about 11 years ago. His smoking use included Cigarettes and Pipe. He has a 80.00 pack-year smoking history. He has never used smokeless tobacco. He reports that he drinks about 19.2 oz of alcohol per week . He reports that he does not  use drugs.  No Known Allergies  Family History  Problem Relation Age of Onset  . Cancer Mother        BRAIN     Prior to Admission medications   Medication Sig Start Date End Date Taking? Authorizing Provider  calcium carbonate (TUMS EX) 750 MG chewable tablet Chew 4 tablets by mouth daily.     [provider]  dorzolamide (TRUSOPT) 2 % ophthalmic solution Place 2 drops into the right eye daily.    [provider]  furosemide (LASIX) 40 MG tablet Take 40 mg by mouth daily.    [provider]  latanoprost (XALATAN) 0.005 % ophthalmic solution Place 1 drop into both eyes daily. Takes at 5 PM    [provider]  levofloxacin (LEVAQUIN) 500 MG tablet Take 1 tablet (500 mg total) by mouth every other day. Start on 03/18/17 03/16/17   Ward, Delice Bison, DO  metoprolol (LOPRESSOR) 50 MG tablet Take 50 mg by mouth 2 (two) times daily.    [provider]  OVER THE COUNTER MEDICATION Take 1 tablet by mouth daily. Iron 65mg     [provider]  sodium bicarbonate 650 MG tablet Take 1,300 mg by mouth daily.     [provider]   Physical Exam: Vitals:   07/21/17 1530 07/21/17 1545 07/21/17 1600 07/21/17 1615  BP: (!) 155/61 (!) 128/96 (!) 142/52 (!) 176/63  Pulse: 84 94 88 83  Resp: 20 (!) 24 15 (!) 22  Temp:  TempSrc:      SpO2: 100% 100% 100% 100%  Weight:      Height:        Wt Readings from Last 3 Encounters:  07/21/17 74.8 kg (165 lb)  06/19/16 86.4 kg (190 lb 8 oz)  05/10/12 83.9 kg (185 lb)    General:  Appears calm and comfortable; alert and oriented 3 Eyes:  PERRL, EOMI, normal lids, iris ENT:  grossly normal hearing, lips & tongue Neck:  no LAD, masses or thyromegaly Cardiovascular:  RRR, no m/r/g. No LE edema.  Respiratory:  CTA bilaterally, no w/r/r. Normal respiratory effort. Abdomen:  soft, ntnd Skin:  no rash or induration seen on limited exam, urostomy right lower quadrant Musculoskeletal:  grossly  normal tone BUE/BLE Psychiatric:  grossly normal mood and affect, speech fluent and appropriate Neurologic:  CN 2-12 grossly intact, moves all extremities in coordinated fashion.          Labs on Admission:  Basic Metabolic Panel:  Recent Labs Lab 07/21/17 1416 07/21/17 1625  NA 133* 133*  K 5.2* 5.0  CL 97* 103  CO2 17*  --   GLUCOSE 189* 119*  BUN 105* 102*  CREATININE 7.80* 8.50*  CALCIUM 8.8*  --    Liver Function Tests: No results for input(s): AST, ALT, ALKPHOS, BILITOT, PROT, ALBUMIN in the last 168 hours. No results for input(s): LIPASE, AMYLASE in the last 168 hours. No results for input(s): AMMONIA in the last 168 hours. CBC:  Recent Labs Lab 07/21/17 1416 07/21/17 1625  WBC 10.4  --   HGB 6.4* 5.8*  HCT 20.2* 17.0*  MCV 97.1  --   PLT 199  --    Cardiac Enzymes: No results for input(s): CKTOTAL, CKMB, CKMBINDEX, TROPONINI in the last 168 hours.  BNP (last 3 results)  Recent Labs  03/16/17 0259 07/21/17 1416  BNP 680.7* 761.6*    ProBNP (last 3 results) No results for input(s): PROBNP in the last 8760 hours.   Serum creatinine: 8.5 mg/dL (H) 07/21/17 1625 Estimated creatinine clearance: 7.6 mL/min (A)  CBG: No results for input(s): GLUCAP in the last 168 hours.  Radiological Exams on Admission: Dg Chest 2 View  Result Date: 07/21/2017 CLINICAL DATA:  Chest pain.  Productive cough. EXAM: CHEST  2 VIEW COMPARISON:  03/16/2017 .  06/19/2016 . FINDINGS: Mediastinum and hilar structures normal. Cardiomegaly with normal pulmonary vascularity. Mild infiltrate right lung base again noted. No pleural effusion or pneumothorax. IMPRESSION: Mild right base pulmonary infiltrate again noted. Mild pneumonia cannot be excluded . Electronically Signed   By: Marcello Moores  Register   On: 07/21/2017 15:07    EKG: Independently reviewed. No STEMI.  Assessment/Plan Principal Problem:   Symptomatic anemia Active Problems:   Hypertension   CKD (chronic kidney  disease), stage V ([facility])   Glaucoma   Symptomatic anemia Patient agreeable to transfusion 2 units PRBCs, this was ordered by EDP Guaiac neg Anemia panel pending Checking bilirubin  Shortness of breath Chest x-ray cleared except for defect seen on x-ray from May When necessary albuterol Likely due to anemia  Hemodialysis/multiple electrolyte abnormalities Patient is very noncompliant with his dialysis Likely will not comply now, I strongly to continue with care by his nephrology in the emergency room Lasix per neprho Tinney sodium bicarbonate  Hypertension When necessary hydralazine 10 mg IV as needed for severe blood pressure Continue Lopressor  Prediabetes SSI  Bladder cancer In remission since a gallbladder removal  Glaucoma Continue Trusopt and Xalatan  Code  Status: Full code  DVT Prophylaxis: SCDs Family Communication: Patient will contact his own family Disposition Plan: Pending Improvement  Status: Inpatient, telemetry  Elwin Mocha, MD Family Medicine [facility] [email or web address] Password [email or web address]

## 2017-07-21 NOTE — ED Notes (Signed)
Admitting MD made aware of +troponin

## 2017-07-21 NOTE — ED Notes (Signed)
CBG was attempted to be collected from pt. Twice at 5:15 but both times were unsuccessful.

## 2017-07-21 NOTE — ED Triage Notes (Signed)
Pt presents to the ED via EMS with reports of recurrent CP ongoing X1 week. Pt states he drinks a glass of wine and pain usually eases off. Today he drank 1 glass of wine and 1/2 can of beer and the pain did not improve. Pt given 324mg  aspirin by ems and placed on 4L [place] due to frequent PVC's. VSS with EMS. Pt with abnormal EKG with EMS.

## 2017-07-22 DIAGNOSIS — E871 Hypo-osmolality and hyponatremia: Secondary | ICD-10-CM

## 2017-07-22 DIAGNOSIS — N185 Chronic kidney disease, stage 5: Secondary | ICD-10-CM

## 2017-07-22 DIAGNOSIS — R0789 Other chest pain: Secondary | ICD-10-CM

## 2017-07-22 DIAGNOSIS — N186 End stage renal disease: Secondary | ICD-10-CM

## 2017-07-22 DIAGNOSIS — R197 Diarrhea, unspecified: Secondary | ICD-10-CM

## 2017-07-22 DIAGNOSIS — D649 Anemia, unspecified: Secondary | ICD-10-CM

## 2017-07-22 DIAGNOSIS — R7989 Other specified abnormal findings of blood chemistry: Secondary | ICD-10-CM

## 2017-07-22 DIAGNOSIS — E875 Hyperkalemia: Secondary | ICD-10-CM

## 2017-07-22 DIAGNOSIS — H409 Unspecified glaucoma: Secondary | ICD-10-CM

## 2017-07-22 DIAGNOSIS — R748 Abnormal levels of other serum enzymes: Secondary | ICD-10-CM

## 2017-07-22 DIAGNOSIS — R778 Other specified abnormalities of plasma proteins: Secondary | ICD-10-CM

## 2017-07-22 DIAGNOSIS — E876 Hypokalemia: Secondary | ICD-10-CM | POA: Insufficient documentation

## 2017-07-22 DIAGNOSIS — R072 Precordial pain: Secondary | ICD-10-CM

## 2017-07-22 LAB — LIPID PANEL
Cholesterol: 141 mg/dL (ref 0–200)
HDL: 55 mg/dL (ref >40)
LDL CALC: 62 mg/dL (ref 0–99)
TRIGLYCERIDES: 122 mg/dL (ref <150)
Total CHOL/HDL Ratio: 2.6 RATIO
VLDL: 24 mg/dL (ref 0–40)

## 2017-07-22 LAB — BASIC METABOLIC PANEL
ANION GAP: 15 (ref 5–15)
BUN: 109 mg/dL — ABNORMAL HIGH (ref 6–20)
CHLORIDE: 106 mmol/L (ref 101–111)
CO2: 16 mmol/L — AB (ref 22–32)
Calcium: 8.3 mg/dL — ABNORMAL LOW (ref 8.9–10.3)
Creatinine, Ser: 7.96 mg/dL — ABNORMAL HIGH (ref 0.61–1.24)
GFR calc non Af Amer: 6 mL/min — ABNORMAL LOW (ref >60)
GFR, EST AFRICAN AMERICAN: 7 mL/min — AB (ref >60)
Glucose, Bld: 111 mg/dL — ABNORMAL HIGH (ref 65–99)
Potassium: 4.7 mmol/L (ref 3.5–5.1)
SODIUM: 137 mmol/L (ref 135–145)

## 2017-07-22 LAB — TROPONIN I
TROPONIN I: 0.27 ng/mL — AB (ref <0.03)
TROPONIN I: 0.25 ng/mL — AB (ref <0.03)
TROPONIN I: 0.27 ng/mL — AB (ref <0.03)
Troponin I: 0.17 ng/mL (ref <0.03)

## 2017-07-22 LAB — CBC
HCT: 22.5 % — ABNORMAL LOW (ref 39.0–52.0)
HEMOGLOBIN: 7.2 g/dL — AB (ref 13.0–17.0)
MCH: 30.3 pg (ref 26.0–34.0)
MCHC: 32 g/dL (ref 30.0–36.0)
MCV: 94.5 fL (ref 78.0–100.0)
Platelets: 156 10*3/uL (ref 150–400)
RBC: 2.38 MIL/uL — AB (ref 4.22–5.81)
RDW: 17.1 % — ABNORMAL HIGH (ref 11.5–15.5)
WBC: 9.2 10*3/uL (ref 4.0–10.5)

## 2017-07-22 LAB — GLUCOSE, CAPILLARY
GLUCOSE-CAPILLARY: 98 mg/dL (ref 65–99)
GLUCOSE-CAPILLARY: 228 mg/dL — AB (ref 65–99)
Glucose-Capillary: 126 mg/dL — ABNORMAL HIGH (ref 65–99)
Glucose-Capillary: 117 mg/dL — ABNORMAL HIGH (ref 65–99)

## 2017-07-22 MED ORDER — BOOST / RESOURCE BREEZE PO LIQD
1.0000 | Freq: Three times a day (TID) | ORAL | Status: DC
  Administered 2017-07-22: 1 via ORAL

## 2017-07-22 MED ORDER — INFLUENZA VAC SPLIT HIGH-DOSE 0.5 ML IM SUSY
0.5000 mL | PREFILLED_SYRINGE | INTRAMUSCULAR | Status: AC
  Administered 2017-07-23: 0.5 mL via INTRAMUSCULAR
  Filled 2017-07-22: qty 0.5

## 2017-07-22 MED ORDER — GLUCERNA SHAKE PO LIQD
237.0000 mL | Freq: Three times a day (TID) | ORAL | Status: DC
  Administered 2017-07-22 – 2017-07-23 (×4): 237 mL via ORAL

## 2017-07-22 NOTE — Consult Note (Signed)
    [facility] Primary Care Navigator  07/22/2017  Matthew Mays 1941-10-22 [id number]   Met with patientat the bedside to identify possible discharge needs.  Patient reports having progressive weakness and chest pain which led to this admission. Patient endorses Dr. Erling Cruz with [facility] as his primary care provider.He verbalized that he mainly sees Dr. Florene Glen and would like to continue seeing him as his primary provider when asked if he would like to see/ have a primary care provider.   Patient shared using [facility] on [address] to obtain medications without difficulty.   Patient reports managing his own medications at home straight out of the containers but uses a "checklist system".   He states that he was driving prior to admission. He was uncertain if son [name] lives in [place]) will be able to provide transportation to his doctors' appointments.  Patient was provided with [facility] list of transportation resources to use when needed. He is aware to contact [facility] CM if he has further transportation needs.   Patient reports living alone and independent with self-care. He mentioned calling his son to assist when needed.  Patient was also provided with [facility] list of personal care services as aback-up resource for future needs,with the understanding that hewill pay out of the pocket for it.Patient wasthankful for the resource lists provided to him.  Anticipated discharge plan is not yet determined per patient.  Per MD note, disposition plan: pending improvement.  Patient expressedunderstanding to call primary care provider's officewhen he returns back home,for a post discharge follow-up appointment within a week or sooner if needed.Patient letter (with PCP's contact number) was provided as hisreminder.  Explained to patient about further [facility] CM services available for healthmanagement and he communicated no other needs or concerns that he could  think of at this time.  Patient voiced understanding to requestreferral to [facility] care managementservicesfrom primary care provider if deemed necessaryin the near future.   [facility] care management information provided for future needs that may arise.  Will notify Inpatient Care Management for follow-up of needs as appropriate.   For questions, please contact:  Dannielle Huh, BSN, RN- [facility] Primary Care Navigator  Telephone: [phone] [place]

## 2017-07-22 NOTE — Progress Notes (Signed)
Initial Nutrition Assessment  DOCUMENTATION CODES:   Not applicable  INTERVENTION:   - Continue Boost Breeze TID (each supplement provides 250 kcals and 9 grams of protein)  NUTRITION DIAGNOSIS:   Inadequate oral intake related to chronic illness (CKD stage V, HTN, bladder cancer, diarrhea) as evidenced by energy intake < or equal to 50% for > or equal to 5 days.  GOAL:   Patient will meet greater than or equal to 90% of their needs  MONITOR:   PO intake, Supplement acceptance, Diet advancement, Weight trends, Skin  REASON FOR ASSESSMENT:   Malnutrition Screening Tool    ASSESSMENT:   76 year old male admitted for chest discomfort and anxiety on 07/21/17. PMH of CKD stage V, bladder cancer, HTN, glaucoma, and prediabetes. Pt refuses dialysis, although he is an eligible candidate. BB TID  Pt reports having diarrhea for the past several weeks. He says consuming chili and spicy foods makes it worse. Pt is able to microwave frozen meals and consume crackers. Pt typically eats two meals per day. Pt reports reading food labels and ordering his groceries online to be delivered at home.   Pt reports losing 3-4 lbs in the past 5 months and clothes fitting looser. Pt began to lose weight about a year ago when hospitalized for a UTI. Multiple hospitalizations and antibiotics throughout this year have contributed to weight loss. Per weight record in chart, pt currently weighs 166 lbs and weighed 190 lb 06/19/16.   Medications: Lasix, Aranesp  Labs: Glucose 111 (H), BUN 109 (H), Cr 7.96 (H), Ca 8.3 (L), GFR 6 (L)   Nutrition Focused Physical Exam Findings: Mild to moderate fat loss and mild to moderate muscle depletion  Diet Order:  Diet clear liquid Room service appropriate? Yes; Fluid consistency: Thin  Skin:  Reviewed, no issues  Last BM:  PTA  Height:   Ht Readings from Last 1 Encounters:  07/21/17 5\' 10"  (1.778 m)    Weight:   Wt Readings from Last 1 Encounters:   07/22/17 166 lb (75.3 kg)    Ideal Body Weight:  75.4 kg  BMI:  Body mass index is 23.82 kg/m.  Estimated Nutritional Needs:   Kcal:  2000-2200 kcals  Protein:  100-110 grams of protein  Fluid:  </= 2 L  EDUCATION NEEDS:   No education needs identified at this time  [place] Intern Pager: [phone] 07/22/2017 12:45 PM

## 2017-07-22 NOTE — Progress Notes (Signed)
Matthew Mays is a 76 y.o. male patient admitted from ED awake, alert - oriented  X 4 - no acute distress noted.  VSS - Blood pressure (!) 158/58, pulse 91, temperature (!) 97.3 F (36.3 C), temperature source Oral, resp. rate 20, height 5\' 10"  (1.778 m), weight 75.6 kg (166 lb 11.2 oz), SpO2 100 %.    IV in place, occlusive dsg intact without redness.  Orientation to room, and floor completed with information packet given to patient/family.  Patient declined safety video at this time.  Admission INP armband ID verified with patient/family, and in place.   SR up x 2, fall assessment complete, with patient and family able to verbalize understanding of risk associated with falls, and verbalized understanding to call nsg before up out of bed.  Call light within reach, patient able to voice, and demonstrate understanding.  Skin, clean-dry- intact without evidence of bruising, or skin tears.   No evidence of skin break down noted on exam.     Will cont to eval and treat per MD orders.  Dorris Carnes, RN 07/22/2017 12:04 AM

## 2017-07-22 NOTE — Progress Notes (Signed)
Pt arrived from ED to the unit via bed.

## 2017-07-22 NOTE — Progress Notes (Signed)
PROGRESS NOTE    Matthew Mays  VOZ:[id number] DOB: [birth year] DOA: 07/21/2017 PCP: Estanislado Emms, MD  Brief Narrative: Matthew Mays is a 76 y.o. male  with past medical history of chronic kidney disease, bladder cancer, hypertension, prediabetes and glaucoma presents to the emergency room with a chief complaint of chest discomfort. Patient states he has long history of chest discomfort that usually has at night and relieved with a glass of wine. He states that he thinks it's mostly anxiety. States that the wife helped with his anxiety. Denies heavy drinking. Denies chest trauma. No recent illness. Chest pain is resolved. Former smoker.  Patient found to have multiple electrolyte abnormalities. He is aware that he is a dialysis candidate but still refuses adamantly. Nephrology saw patient at bedside. Patient only wants blood transfusions. Wants no other interventions. He was evaluated and CBC improved and Troponins are appearing Flat. Will Monitor CBC in AM and likely D/C Home if stable.   Assessment & Plan:   Principal Problem:   Symptomatic anemia Active Problems:   End stage renal disease (HCC)   Hypertension   CKD (chronic kidney disease), stage V (HCC)   Glaucoma   Diarrhea   Hyponatremia   Hypokalemia   Elevated troponin   Chest discomfort  Symptomatic Anemia of Chronic Disease -S/p 2 units of pRBC's -FOBT Negative -Anemia Panel showed Iron of 60, UIBC of 179, TIBC of 239, Saturation Ratio of 25, Folate of 10,3, and Vitamin B12 of 450 -Patient's Hb/Hct went from 5.8/17.0 -> 6.2/19.1 -> 7.2/22.5 -Reticulocyte Count Percentage was 4.7% and Absolute Retic Count was 91.7 -Nephrology giving Darbepoetin Alfa 200 mcg q Every Thursday -Continue to Monitor and repeat CBC in AM  Shortness of Breath, improved -Chest x-ray showed Mediastinum and hilar structures normal. Cardiomegaly with normal pulmonary vascularity. Mild infiltrate right lung base again noted (since 03/16/17). No  pleural effusion or pneumothorax. 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 Florene Glen as an outpatient -Repeat CMP in AM   Hypertension -C/w Metoprolol 50 mg po BID -Add Hydralazine prn for Severe BP  PreDiabetes -C/w Moderate Novolog SSI AC -CBG's ranging from 98-228  Bladder Cancer -In remission since Cystectomy and Urostomy  Glaucoma -C/w Dorzolamide 2% Opthalmic Solution 2 drops to Right Eye and with Latanoprost 1 drop Both Eyes  Chest Pain/Discomfort and Anxiety -Likely From Anemia and resolved after Transfusion -Has a Hx of Chest Discomfort and it alleviates when he drinks wine -Discussed with Cards and recommend outpatient follow up and will be Arranged -Patient to follow up with Dr. Tamala Julian  Elevated Troponin -Likley from CKD Stage 5 and in the Presence of Severe Anemia -Troponin went from 0.07 -> 0.17 -> 0.27 -> 0.25  -Discussed Case with Cardiologist Dr. Oval Linsey and she recommended outpatient evaluation and will be arranged to see Dr. Tamala Julian  -Dr. Oval Linsey recommended checking Lipid Panel -Lipid Panel shows Cholesterol of 141, HDL of 55, LDL of 62, TG of 122, VLDL of 24  Diarrhea/Loose Stools -Intermittent and had a solitary loose stool yesterday -Has been on Abx Recently  -Has no Leukocytosis or Fever -Unlikely C. Difficile but continue to Monitor  Hyponatremia -Improved -Repeat CMP in AM  Hyperkalemia -Improved -Repeat CMP in  AM  DVT prophylaxis: SCDs as patient had Symptomatic Anemia Code Status: FULL CODE Family Communication: No family present at bedside Disposition Plan: Home likely in AM  Consultants:   Nephrology Dr. Jimmy Footman  Discussed Case with Cardiology Dr.  Skeet Latch   Procedures:    Antimicrobials:  Anti-infectives    None     Subjective: Seen and examined at bedside and stated CP has resolved. Felt better breathing wise. Stated he has been having intermittent "diarrhea" but has not had a bowel movement today. No other concerns or complaints.  Objective: Vitals:   07/21/17 2309 07/22/17 0552 07/22/17 1012 07/22/17 1317  BP: (!) 158/58 (!) 131/55 (!) 152/59 (!) 143/64  Pulse: 91 82 92 79  Resp: 18 17  20   Temp: (!) 97.3 F (36.3 C) 97.8 F (36.6 C)  98.2 F (36.8 C)  TempSrc: Oral Oral  Oral  SpO2: 99% 100%  98%  Weight:  75.3 kg (166 lb)    Height:        Intake/Output Summary (Last 24 hours) at 07/22/17 1856 Last data filed at 07/22/17 1452  Gross per 24 hour  Intake             1427 ml  Output              975 ml  Net              452 ml   Filed Weights   07/21/17 1420 07/21/17 2104 07/22/17 0552  Weight: 74.8 kg (165 lb) 75.6 kg (166 lb 11.2 oz) 75.3 kg (166 lb)   Examination: Physical Exam:  Constitutional: WN/WD Caucasian male in NAD and appears calm  Eyes:  Lids and conjunctivae normal, sclerae anicteric  ENMT: External Ears, Nose appear normal. Grossly normal hearing. Mucous membranes are moist.  Neck: Appears normal, supple, no cervical masses, normal ROM, no appreciable thyromegaly, no JVD Respiratory: Diminished to auscultation bilaterally slightly, no wheezing, rales, rhonchi or crackles. Normal respiratory effort and patient is not tachypenic. No accessory muscle use.  Cardiovascular: RRR, no murmurs / rubs / gallops. S1 and S2 auscultated. No extremity edema. Abdomen: Soft, non-tender, non-distended. Has a Urostomy GU: Deferred. Musculoskeletal: No contractures. Normal strength and muscle tone.  Skin: No rashes, lesions, ulcers on a limited skin eval. No induration; Warm and dry.  Neurologic: CN 2-12 grossly intact with no focal deficits. Romberg sign cerebellar reflexes not assessed.    Psychiatric: Normal judgment and insight. Alert and oriented x 3. Normal mood and appropriate affect.   Data Reviewed: I have personally reviewed following labs and imaging studies  CBC:  Recent Labs Lab 07/21/17 1416 07/21/17 1625 07/21/17 1806 07/22/17 0147  WBC 10.4  --  9.1 9.2  HGB 6.4* 5.8* 6.2* 7.2*  HCT 20.2* 17.0* 19.1* 22.5*  MCV 97.1  --  95.5 94.5  PLT 199  --  181 097   Basic Metabolic Panel:  Recent Labs Lab 07/21/17 1416 07/21/17 1625 07/22/17 0147  NA 133* 133* 137  K 5.2* 5.0 4.7  CL 97* 103 106  CO2 17*  --  16*  GLUCOSE 189* 119* 111*  BUN 105* 102* 109*  CREATININE 7.80* 8.50* 7.96*  CALCIUM 8.8*  --  8.3*   GFR: Estimated Creatinine Clearance: 8.2 mL/min (A) (by C-G formula based on SCr of 7.96 mg/dL (H)). Liver Function Tests:  Recent Labs Lab 07/21/17 1620  AST 10*  ALT 10*  ALKPHOS 68  BILITOT 0.6  PROT 6.8  ALBUMIN  3.1*    Recent Labs Lab 07/21/17 1620  LIPASE 64*   No results for input(s): AMMONIA in the last 168 hours. Coagulation Profile: No results for input(s): INR, PROTIME in the last 168 hours. Cardiac Enzymes:  Recent Labs Lab 07/21/17 1806 07/22/17 0147 07/22/17 0831 07/22/17 1616  TROPONINI 0.07* 0.17* 0.27* 0.25*   BNP (last 3 results) No results for input(s): PROBNP in the last 8760 hours. HbA1C: No results for input(s): HGBA1C in the last 72 hours. CBG:  Recent Labs Lab 07/21/17 1725 07/21/17 2120 07/22/17 0803 07/22/17 1139 07/22/17 1646  GLUCAP 104* 96 98 228* 126*   Lipid Profile:  Recent Labs  07/22/17 1616  CHOL 141  HDL 55  LDLCALC 62  TRIG 122  CHOLHDL 2.6   Thyroid Function Tests: No results for input(s): TSH, T4TOTAL, FREET4, T3FREE, THYROIDAB in the last 72 hours. Anemia Panel:  Recent Labs  07/21/17 1806  VITAMINB12 450  FOLATE 10.3  FERRITIN 324  TIBC 245*  239*  IRON 61  60  RETICCTPCT 4.7*   Sepsis Labs: No results for input(s): PROCALCITON, LATICACIDVEN  in the last 168 hours.  No results found for this or any previous visit (from the past 240 hour(s)).   Radiology Studies: Dg Chest 2 View  Result Date: 07/21/2017 CLINICAL DATA:  Chest pain.  Productive cough. EXAM: CHEST  2 VIEW COMPARISON:  03/16/2017 .  06/19/2016 . FINDINGS: Mediastinum and hilar structures normal. Cardiomegaly with normal pulmonary vascularity. Mild infiltrate right lung base again noted. No pleural effusion or pneumothorax. IMPRESSION: Mild right base pulmonary infiltrate again noted. Mild pneumonia cannot be excluded . Electronically Signed   By: Marcello Moores  Register   On: 07/21/2017 15:07   Scheduled Meds: . darbepoetin (ARANESP) injection - NON-DIALYSIS  200 mcg Subcutaneous Q Thu-1800  . dorzolamide  2 drop Right Eye Daily  . feeding supplement (GLUCERNA SHAKE)  237 mL Oral TID BM  . furosemide  40 mg Oral QAC breakfast  . [START ON 07/23/2017] Influenza vac split quadrivalent PF  0.5 mL Intramuscular Tomorrow-1000  . insulin aspart  0-15 Units Subcutaneous TID WC  . latanoprost  1 drop Both Eyes Daily  . metoprolol tartrate  50 mg Oral BID  . sodium bicarbonate  1,300 mg Oral TID   Continuous Infusions: . sodium chloride      LOS: 1 day   Kerney Elbe, DO [facility] Pager [phone]  If 7PM-7AM, please contact night-coverage [email or web address] Password [facility] 07/22/2017, 6:56 PM

## 2017-07-23 LAB — CBC WITH DIFFERENTIAL/PLATELET
BASOS PCT: 0 %
Basophils Absolute: 0 10*3/uL (ref 0.0–0.1)
EOS PCT: 3 %
Eosinophils Absolute: 0.2 10*3/uL (ref 0.0–0.7)
HCT: 21.9 % — ABNORMAL LOW (ref 39.0–52.0)
Hemoglobin: 7.1 g/dL — ABNORMAL LOW (ref 13.0–17.0)
LYMPHS ABS: 0.8 10*3/uL (ref 0.7–4.0)
Lymphocytes Relative: 11 %
MCH: 30.3 pg (ref 26.0–34.0)
MCHC: 32.4 g/dL (ref 30.0–36.0)
MCV: 93.6 fL (ref 78.0–100.0)
MONOS PCT: 9 %
Monocytes Absolute: 0.7 10*3/uL (ref 0.1–1.0)
Neutro Abs: 5.5 10*3/uL (ref 1.7–7.7)
Neutrophils Relative %: 77 %
PLATELETS: 153 10*3/uL (ref 150–400)
RBC: 2.34 MIL/uL — ABNORMAL LOW (ref 4.22–5.81)
RDW: 17.2 % — AB (ref 11.5–15.5)
WBC: 7.2 10*3/uL (ref 4.0–10.5)

## 2017-07-23 LAB — COMPREHENSIVE METABOLIC PANEL
ALBUMIN: 2.7 g/dL — AB (ref 3.5–5.0)
ALK PHOS: 59 U/L (ref 38–126)
ALT: 8 U/L — ABNORMAL LOW (ref 17–63)
ANION GAP: 12 (ref 5–15)
AST: 8 U/L — ABNORMAL LOW (ref 15–41)
BUN: 102 mg/dL — ABNORMAL HIGH (ref 6–20)
CALCIUM: 7.7 mg/dL — AB (ref 8.9–10.3)
CO2: 18 mmol/L — ABNORMAL LOW (ref 22–32)
Chloride: 105 mmol/L (ref 101–111)
Creatinine, Ser: 7.64 mg/dL — ABNORMAL HIGH (ref 0.61–1.24)
GFR calc non Af Amer: 6 mL/min — ABNORMAL LOW (ref >60)
GFR, EST AFRICAN AMERICAN: 7 mL/min — AB (ref >60)
GLUCOSE: 102 mg/dL — AB (ref 65–99)
Potassium: 4.7 mmol/L (ref 3.5–5.1)
SODIUM: 135 mmol/L (ref 135–145)
TOTAL PROTEIN: 6 g/dL — AB (ref 6.5–8.1)
Total Bilirubin: 0.6 mg/dL (ref 0.3–1.2)

## 2017-07-23 LAB — HEMOGLOBIN AND HEMATOCRIT, BLOOD
HEMATOCRIT: 26.5 % — AB (ref 39.0–52.0)
Hemoglobin: 8.7 g/dL — ABNORMAL LOW (ref 13.0–17.0)

## 2017-07-23 LAB — GLUCOSE, CAPILLARY
GLUCOSE-CAPILLARY: 103 mg/dL — AB (ref 65–99)
Glucose-Capillary: 140 mg/dL — ABNORMAL HIGH (ref 65–99)
Glucose-Capillary: 124 mg/dL — ABNORMAL HIGH (ref 65–99)

## 2017-07-23 LAB — PREPARE RBC (CROSSMATCH)

## 2017-07-23 LAB — MAGNESIUM: Magnesium: 3 mg/dL — ABNORMAL HIGH (ref 1.7–2.4)

## 2017-07-23 LAB — PHOSPHORUS: Phosphorus: 8.4 mg/dL — ABNORMAL HIGH (ref 2.5–4.6)

## 2017-07-23 MED ORDER — SODIUM CHLORIDE 0.9 % IV SOLN: Freq: Once | INTRAVENOUS | Status: DC

## 2017-07-23 NOTE — Progress Notes (Signed)
Nsg Discharge Note  Admit Date:  07/21/2017 Discharge date: 07/23/2017   Elyn Aquas Pesci to be D/C'd Home per MD order.  AVS completed.  Copy for chart, and copy for patient signed, and dated. Patient/caregiver able to verbalize understanding.  Discharge Medication: Allergies as of 07/23/2017   No Known Allergies     Medication List    TAKE these medications   calcium carbonate 750 MG chewable tablet Commonly known as:  TUMS EX Chew 3 tablets by mouth See admin instructions. 3 tabs at lunch and 3 tabs at supper   dorzolamide 2 % ophthalmic solution Commonly known as:  TRUSOPT Place 2 drops into the right eye daily.   furosemide 80 MG tablet Commonly known as:  LASIX Take 80 mg by mouth daily.   latanoprost 0.005 % ophthalmic solution Commonly known as:  XALATAN Place 1 drop into both eyes daily. Takes at 5 PM   metoprolol tartrate 100 MG tablet Commonly known as:  LOPRESSOR Take 100 mg by mouth 2 (two) times daily.   OVER THE COUNTER MEDICATION Take 1 tablet by mouth 2 (two) times daily. Iron 65mg    sodium bicarbonate 650 MG tablet Take 1,300 mg by mouth daily.            Discharge Care Instructions        Start     Ordered   07/23/17 0000  Increase activity slowly     07/23/17 1749   07/23/17 0000  Diet - low sodium heart healthy     07/23/17 1749   07/23/17 0000  Discharge instructions    Comments:  Follow up with PCP and Nephrology as an outpatient. Take all medications as prescribed. If symptoms change or worsen please return to the ED for evaluation.   07/23/17 1749   07/23/17 0000  Call MD for:  temperature >100.4     07/23/17 1749   07/23/17 0000  Call MD for:  persistant nausea and vomiting     07/23/17 1749   07/23/17 0000  Call MD for:  severe uncontrolled pain     07/23/17 1749   07/23/17 0000  Call MD for:  redness, tenderness, or signs of infection (pain, swelling, redness, odor or green/yellow discharge around incision site)     07/23/17 1749    07/23/17 0000  Call MD for:  difficulty breathing, headache or visual disturbances     07/23/17 1749   07/23/17 0000  Call MD for:  hives     07/23/17 1749   07/23/17 0000  Call MD for:  persistant dizziness or light-headedness     07/23/17 1749   07/23/17 0000  Call MD for:  extreme fatigue     07/23/17 1749      Discharge Assessment: Vitals:   07/23/17 1310 07/23/17 1508  BP: (!) 157/52 (!) 156/61  Pulse: 86 88  Resp: 16 16  Temp: 98.3 F (36.8 C) 98.2 F (36.8 C)  SpO2: 99% 100%   Skin clean, dry and intact without evidence of skin break down, no evidence of skin tears noted. IV catheter discontinued intact. Site without signs and symptoms of complications - no redness or edema noted at insertion site, patient denies c/o pain - only slight tenderness at site.  Dressing with slight pressure applied.  D/c Instructions-Education: Discharge instructions given to patient/family with verbalized understanding. D/c education completed with patient/family including follow up instructions, medication list, d/c activities limitations if indicated, with other d/c instructions as indicated by MD -  patient able to verbalize understanding, all questions fully answered. Patient instructed to return to ED, call 911, or call MD for any changes in condition.  Patient escorted via [place], and D/C home via private auto.  Salley Slaughter, RN 07/23/2017 5:56 PM

## 2017-07-23 NOTE — Discharge Summary (Signed)
Physician Discharge Summary  [name] SWF:[id number] DOB: [birth year] DOA: 07/21/2017  PCP: Estanislado Emms, MD  Admit date: 07/21/2017 Discharge date: 07/23/2017  Admitted From: Home Disposition:  Home  Recommendations for Outpatient Follow-up:  1. Follow up with PCP in 1-2 weeks 2. Follow up with Cardiology as an outpatient; Appointment will be scheduled for you per Cardiology 3. Follow up with Nephrology as an outpatient  4. Please obtain CMP/CBC, Mag, Phos in one week 5. Please follow up on the following pending results:  Home Health: No Equipment/Devices: None    Discharge Condition: Stable CODE STATUS: FULL CODE Diet recommendation: Heart Healthy Diet  Brief/Interim Summary: Matthew Mays a 76 y.o.malewith past medical history of chronic kidney disease, bladder cancer, hypertension, prediabetes and glaucoma who presented to the emergency room with a chief complaint of chest discomfort and symptomatic anemia. Patient states he has long history of chest discomfort that usually has at night and relieved with a glass of wine. He states that he thinks it's mostly anxiety. States that the wife helped with his anxiety. Denies heavy drinking. Denies chest trauma. No recent illness. Chest pain is resolved. Former smoker.  Patient found to have multiple electrolyte abnormalities. He is aware that he is a dialysis candidate but still refuses adamantly. Nephrology saw patient at bedside and patient only wants blood transfusions. Wants no other interventions. He was evaluated and CBC improved after transfusions and Troponins are appearing Flat. Discussed with Cardiology who will follow up with the patient as an outpatient. Patient's Hb improved to 8.7 and he was deemed medically stable to D/C home and will need to follow up with PCP, Nephrology, and Cardiology as an outpatient.   Discharge Diagnoses:  Principal Problem:   Symptomatic anemia Active Problems:   End stage renal disease  (HCC)   Hypertension   CKD (chronic kidney disease), stage V (HCC)   Glaucoma   Diarrhea   Hyponatremia   Elevated troponin   Chest discomfort   Hyperkalemia  Symptomatic Anemia of Chronic Disease, improved -S/p 3 units of pRBC's -FOBT Negative -Anemia Panel showed Iron of 60, UIBC of 179, TIBC of 239, Saturation Ratio of 25, Folate of 10,3, and Vitamin B12 of 450 -Patient's Hb/Hct went from 5.8/17.0 -> 8.7/26.5 -Reticulocyte Count Percentage was 4.7% and Absolute Retic Count was 91.7 -Nephrology giving Darbepoetin Alfa 200 mcg q Every Thursday -Follow up with PCP and Nephrology as an outpatient   Shortness of Breath, improved -Chest x-ray showed Mediastinum and hilar structures normal. Cardiomegaly with normal pulmonary vascularity. Mild infiltrate right lung base again noted (since 03/16/17). No pleural effusion or pneumothorax. 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 Florene Glen as an outpatient -Repeat CMP in AM   Hypertension -C/w Metoprolol 50 mg po BID   PreDiabetes -C/w Moderate Novolog SSI AC -CBG's ranging from 103-140  Bladder Cancer -In remission since Cystectomy and Urostomy -Follow up with Urology   Glaucoma -C/w Dorzolamide 2% Opthalmic Solution 2 drops to Right Eye and with Latanoprost 1 drop Both Eyes  Chest Pain/Discomfort and Anxiety -Likely From Anemia and resolved after Transfusion and improved  -Has a Hx of Chest Discomfort and it alleviates when he drinks wine -Discussed with Cards and recommend outpatient  follow up and will be Arranged -Patient to follow up with Dr. Tamala Julian  Elevated Troponin -Likley from CKD Stage 5 and in the Presence of Severe  Anemia -Troponin went from 0.07 -> 0.17 -> 0.27 -> 0.25 -> 0.27 -Discussed Case with Cardiologist Dr. Oval Linsey and she recommended outpatient evaluation and will be arranged to see Dr. Tamala Julian  -Dr. Oval Linsey recommended checking Lipid Panel -Lipid Panel shows Cholesterol of 141, HDL of 55, LDL of 62, TG of 122, VLDL of 24 -Follow up with Dr. Tamala Julian as an outpatient.   Diarrhea/Loose Stools -Intermittent and had a solitary loose stool yesterday -Has been on Abx Recently  -Has no Leukocytosis or Fever -Unlikely C. Difficile but continue to Monitor -No Reccurent BM's  Hyponatremia -Improved and Na+ was 135 -Repeat CMP as an outpatient   Hyperkalemia -Improved and K+ was 4.7 -Repeat CMP as an outpatient   Discharge Instructions  Discharge Instructions    Call MD for:  difficulty breathing, headache or visual disturbances    Complete by:  As directed    Call MD for:  extreme fatigue    Complete by:  As directed    Call MD for:  hives    Complete by:  As directed    Call MD for:  persistant dizziness or light-headedness    Complete by:  As directed    Call MD for:  persistant nausea and vomiting    Complete by:  As directed    Call MD for:  redness, tenderness, or signs of infection (pain, swelling, redness, odor or green/yellow discharge around incision site)    Complete by:  As directed    Call MD for:  severe uncontrolled pain    Complete by:  As directed    Call MD for:  temperature >100.4    Complete by:  As directed    Diet - low sodium heart healthy    Complete by:  As directed    Discharge instructions    Complete by:  As directed    Follow up with PCP and Nephrology as an outpatient. Take all medications as prescribed. If symptoms change or worsen please return to the ED for evaluation.   Increase activity slowly    Complete by:  As directed      Allergies as of 07/23/2017   No Known Allergies     Medication List    TAKE these medications   calcium  carbonate 750 MG chewable tablet Commonly known as:  TUMS EX Chew 3 tablets by mouth See admin instructions. 3 tabs at lunch and 3 tabs at supper   dorzolamide 2 % ophthalmic solution Commonly known as:  TRUSOPT Place 2 drops into the right eye daily.   furosemide 80 MG tablet Commonly known as:  LASIX Take 80 mg by mouth daily.   latanoprost 0.005 % ophthalmic solution Commonly known as:  XALATAN Place 1 drop into both eyes daily. Takes at 5 PM   metoprolol tartrate 100 MG tablet Commonly known as:  LOPRESSOR Take 100 mg by mouth 2 (two) times daily.   OVER THE COUNTER MEDICATION Take 1 tablet by mouth 2 (two) times daily. Iron 65mg    sodium bicarbonate 650 MG tablet Take 1,300 mg by mouth daily.            Discharge Care Instructions        Start     Ordered   07/23/17 0000  Increase activity slowly     07/23/17 1749  07/23/17 0000  Diet - low sodium heart healthy     07/23/17 1749   07/23/17 0000  Discharge instructions    Comments:  Follow up with PCP and Nephrology as an outpatient. Take all medications as prescribed. If symptoms change or worsen please return to the ED for evaluation.   07/23/17 1749   07/23/17 0000  Call MD for:  temperature >100.4     07/23/17 1749   07/23/17 0000  Call MD for:  persistant nausea and vomiting     07/23/17 1749   07/23/17 0000  Call MD for:  severe uncontrolled pain     07/23/17 1749   07/23/17 0000  Call MD for:  redness, tenderness, or signs of infection (pain, swelling, redness, odor or green/yellow discharge around incision site)     07/23/17 1749   07/23/17 0000  Call MD for:  difficulty breathing, headache or visual disturbances     07/23/17 1749   07/23/17 0000  Call MD for:  hives     07/23/17 1749   07/23/17 0000  Call MD for:  persistant dizziness or light-headedness     07/23/17 1749   07/23/17 0000  Call MD for:  extreme fatigue     07/23/17 1749     Follow-up Information    Estanislado Emms, MD. Call.    Specialty:  Nephrology Why:  Follow up within 1 week Contact information: [address] [place] [address] [phone]          No Known Allergies  Consultations:  Nephrology  Procedures/Studies: Dg Chest 2 View  Result Date: 07/21/2017 CLINICAL DATA:  Chest pain.  Productive cough. EXAM: CHEST  2 VIEW COMPARISON:  03/16/2017 .  06/19/2016 . FINDINGS: Mediastinum and hilar structures normal. Cardiomegaly with normal pulmonary vascularity. Mild infiltrate right lung base again noted. No pleural effusion or pneumothorax. IMPRESSION: Mild right base pulmonary infiltrate again noted. Mild pneumonia cannot be excluded . Electronically Signed   By: Marcello Moores  Register   On: 07/21/2017 15:07     Subjective: Seen and examined and improved breathing wise. Hb/Hct was still low so he was transfused 1 unit pRBC prior to D/C. Patient improved Hb to 8.7 and had no complaints and was ready to go home.  Discharge Exam: Vitals:   07/23/17 1310 07/23/17 1508  BP: (!) 157/52 (!) 156/61  Pulse: 86 88  Resp: 16 16  Temp: 98.3 F (36.8 C) 98.2 F (36.8 C)  SpO2: 99% 100%   Vitals:   07/23/17 0548 07/23/17 1253 07/23/17 1310 07/23/17 1508  BP: (!) 143/54 (!) 151/53 (!) 157/52 (!) 156/61  Pulse: 85 81 86 88  Resp: 20 16 16 16   Temp: 98.6 F (37 C) 98.2 F (36.8 C) 98.3 F (36.8 C) 98.2 F (36.8 C)  TempSrc: Oral Oral Oral Oral  SpO2: 99% 99% 99% 100%  Weight:      Height:       General: Pt is alert, awake, not in acute distress Cardiovascular: RRR, S1/S2 +, no rubs, no gallops Respiratory: CTA bilaterally, no wheezing, no rhonchi Abdominal: Soft, NT, ND, bowel sounds +; Has Urostomy with good clear urin output Extremities: no edema, no cyanosis  The results of significant diagnostics from this hospitalization (including imaging, microbiology, ancillary and laboratory) are listed below for reference.    Microbiology: No results found  for this or any  previous visit (from the past 240 hour(s)).   Labs: BNP (last 3 results)  Recent Labs  03/16/17 0259 07/21/17 1416  BNP 680.7* 756.4*   Basic Metabolic Panel:  Recent Labs Lab 07/21/17 1416 07/21/17 1625 07/22/17 0147 07/23/17 0454  NA 133* 133* 137 135  K 5.2* 5.0 4.7 4.7  CL 97* 103 106 105  CO2 17*  --  16* 18*  GLUCOSE 189* 119* 111* 102*  BUN 105* 102* 109* 102*  CREATININE 7.80* 8.50* 7.96* 7.64*  CALCIUM 8.8*  --  8.3* 7.7*  MG  --   --   --  3.0*  PHOS  --   --   --  8.4*   Liver Function Tests:  Recent Labs Lab 07/21/17 1620 07/23/17 0454  AST 10* 8*  ALT 10* 8*  ALKPHOS 68 59  BILITOT 0.6 0.6  PROT 6.8 6.0*  ALBUMIN 3.1* 2.7*    Recent Labs Lab 07/21/17 1620  LIPASE 64*   No results for input(s): AMMONIA in the last 168 hours. CBC:  Recent Labs Lab 07/21/17 1416 07/21/17 1625 07/21/17 1806 07/22/17 0147 07/23/17 0454 07/23/17 1653  WBC 10.4  --  9.1 9.2 7.2  --   NEUTROABS  --   --   --   --  5.5  --   HGB 6.4* 5.8* 6.2* 7.2* 7.1* 8.7*  HCT 20.2* 17.0* 19.1* 22.5* 21.9* 26.5*  MCV 97.1  --  95.5 94.5 93.6  --   PLT 199  --  181 156 153  --    Cardiac Enzymes:  Recent Labs Lab 07/21/17 1806 07/22/17 0147 07/22/17 0831 07/22/17 1616 07/22/17 1921  TROPONINI 0.07* 0.17* 0.27* 0.25* 0.27*   BNP: Invalid input(s): POCBNP CBG:  Recent Labs Lab 07/22/17 1646 07/22/17 2103 07/23/17 0732 07/23/17 1303 07/23/17 1649  GLUCAP 126* 117* 103* 140* 124*   D-Dimer No results for input(s): DDIMER in the last 72 hours. Hgb A1c No results for input(s): HGBA1C in the last 72 hours. Lipid Profile  Recent Labs  07/22/17 1616  CHOL 141  HDL 55  LDLCALC 62  TRIG 122  CHOLHDL 2.6   Thyroid function studies No results for input(s): TSH, T4TOTAL, T3FREE, THYROIDAB in the last 72 hours.  Invalid input(s): FREET3 Anemia work up  Recent Labs  07/21/17 1806  VITAMINB12 450  FOLATE 10.3  FERRITIN 324  TIBC 245*  239*   IRON 61  60  RETICCTPCT 4.7*   Urinalysis    Component Value Date/Time   COLORURINE YELLOW 06/19/2016 0936   APPEARANCEUR HAZY (A) 06/19/2016 0936   LABSPEC 1.014 06/19/2016 0936   PHURINE 6.5 06/19/2016 0936   GLUCOSEU NEGATIVE 06/19/2016 0936   HGBUR MODERATE (A) 06/19/2016 0936   BILIRUBINUR NEGATIVE 06/19/2016 0936   KETONESUR NEGATIVE 06/19/2016 0936   PROTEINUR 100 (A) 06/19/2016 0936   NITRITE NEGATIVE 06/19/2016 0936   LEUKOCYTESUR MODERATE (A) 06/19/2016 0936   Sepsis Labs Invalid input(s): PROCALCITONIN,  WBC,  LACTICIDVEN Microbiology No results found for this or any previous visit (from the past 240 hour(s)).  Time coordinating discharge: 35 minutes  SIGNED:  Kerney Elbe, DO [facility] 07/23/2017, 5:50 PM Pager [phone]  If 7PM-7AM, please contact night-coverage [email or web address] Password [email or web address]

## 2017-07-24 LAB — TYPE AND SCREEN
ABO/RH(D): A POS
ANTIBODY SCREEN: NEGATIVE
Unit division: 0
Unit division: 0

## 2017-07-24 LAB — BPAM RBC
BLOOD PRODUCT EXPIRATION DATE: 201810112359
Blood Product Expiration Date: 201810162359
ISSUE DATE / TIME: 201809291242
ISSUE DATE / TIME: 201809271757
Unit Type and Rh: 6200
Unit Type and Rh: 6200

## 2017-08-12 ENCOUNTER — Encounter (HOSPITAL_COMMUNITY): Payer: Self-pay

## 2017-08-12 ENCOUNTER — Observation Stay (HOSPITAL_COMMUNITY)
Admission: EM | Admit: 2017-08-12 | Discharge: 2017-08-13 | Disposition: A | Discharge status: Home / Self Care | Payer: Medicare Other | Attending: Internal Medicine | Admitting: Internal Medicine

## 2017-08-12 ENCOUNTER — Emergency Department (HOSPITAL_COMMUNITY): Payer: Medicare Other

## 2017-08-12 DIAGNOSIS — Z87891 Personal history of nicotine dependence: Secondary | ICD-10-CM | POA: Insufficient documentation

## 2017-08-12 DIAGNOSIS — Z936 Other artificial openings of urinary tract status: Secondary | ICD-10-CM | POA: Diagnosis not present

## 2017-08-12 DIAGNOSIS — R0789 Other chest pain: Principal | ICD-10-CM | POA: Diagnosis present

## 2017-08-12 DIAGNOSIS — I120 Hypertensive chronic kidney disease with stage 5 chronic kidney disease or end stage renal disease: Secondary | ICD-10-CM | POA: Insufficient documentation

## 2017-08-12 DIAGNOSIS — I200 Unstable angina: Secondary | ICD-10-CM

## 2017-08-12 DIAGNOSIS — H409 Unspecified glaucoma: Secondary | ICD-10-CM | POA: Insufficient documentation

## 2017-08-12 DIAGNOSIS — R7303 Prediabetes: Secondary | ICD-10-CM | POA: Insufficient documentation

## 2017-08-12 DIAGNOSIS — Z906 Acquired absence of other parts of urinary tract: Secondary | ICD-10-CM | POA: Insufficient documentation

## 2017-08-12 DIAGNOSIS — Z79899 Other long term (current) drug therapy: Secondary | ICD-10-CM | POA: Insufficient documentation

## 2017-08-12 DIAGNOSIS — D631 Anemia in chronic kidney disease: Secondary | ICD-10-CM | POA: Diagnosis not present

## 2017-08-12 DIAGNOSIS — Z87440 Personal history of urinary (tract) infections: Secondary | ICD-10-CM | POA: Insufficient documentation

## 2017-08-12 DIAGNOSIS — Z808 Family history of malignant neoplasm of other organs or systems: Secondary | ICD-10-CM | POA: Insufficient documentation

## 2017-08-12 DIAGNOSIS — R072 Precordial pain: Secondary | ICD-10-CM | POA: Diagnosis not present

## 2017-08-12 DIAGNOSIS — I350 Nonrheumatic aortic (valve) stenosis: Secondary | ICD-10-CM | POA: Diagnosis not present

## 2017-08-12 DIAGNOSIS — D649 Anemia, unspecified: Secondary | ICD-10-CM | POA: Diagnosis not present

## 2017-08-12 DIAGNOSIS — J449 Chronic obstructive pulmonary disease, unspecified: Secondary | ICD-10-CM | POA: Diagnosis not present

## 2017-08-12 DIAGNOSIS — R079 Chest pain, unspecified: Secondary | ICD-10-CM | POA: Diagnosis not present

## 2017-08-12 DIAGNOSIS — N2581 Secondary hyperparathyroidism of renal origin: Secondary | ICD-10-CM | POA: Diagnosis not present

## 2017-08-12 DIAGNOSIS — I4510 Unspecified right bundle-branch block: Secondary | ICD-10-CM | POA: Diagnosis not present

## 2017-08-12 DIAGNOSIS — I129 Hypertensive chronic kidney disease with stage 1 through stage 4 chronic kidney disease, or unspecified chronic kidney disease: Secondary | ICD-10-CM | POA: Diagnosis not present

## 2017-08-12 DIAGNOSIS — Z8551 Personal history of malignant neoplasm of bladder: Secondary | ICD-10-CM | POA: Diagnosis not present

## 2017-08-12 DIAGNOSIS — N186 End stage renal disease: Secondary | ICD-10-CM | POA: Diagnosis not present

## 2017-08-12 DIAGNOSIS — N185 Chronic kidney disease, stage 5: Secondary | ICD-10-CM | POA: Diagnosis present

## 2017-08-12 LAB — HEPATIC FUNCTION PANEL
ALBUMIN: 3 g/dL — AB (ref 3.5–5.0)
ALT: 11 U/L — ABNORMAL LOW (ref 17–63)
AST: 12 U/L — ABNORMAL LOW (ref 15–41)
Alkaline Phosphatase: 72 U/L (ref 38–126)
BILIRUBIN TOTAL: 0.4 mg/dL (ref 0.3–1.2)
Bilirubin, Direct: <0.1 mg/dL — ABNORMAL LOW (ref 0.1–0.5)
TOTAL PROTEIN: 7.1 g/dL (ref 6.5–8.1)

## 2017-08-12 LAB — I-STAT TROPONIN, ED: TROPONIN I, POC: 0.05 ng/mL (ref 0.00–0.08)

## 2017-08-12 LAB — BASIC METABOLIC PANEL
ANION GAP: 19 — AB (ref 5–15)
BUN: 100 mg/dL — ABNORMAL HIGH (ref 6–20)
CHLORIDE: 98 mmol/L — AB (ref 101–111)
CO2: 17 mmol/L — AB (ref 22–32)
Calcium: 8.3 mg/dL — ABNORMAL LOW (ref 8.9–10.3)
Creatinine, Ser: 7.64 mg/dL — ABNORMAL HIGH (ref 0.61–1.24)
GFR calc non Af Amer: 6 mL/min — ABNORMAL LOW (ref >60)
GFR, EST AFRICAN AMERICAN: 7 mL/min — AB (ref >60)
Glucose, Bld: 97 mg/dL (ref 65–99)
Potassium: 4.6 mmol/L (ref 3.5–5.1)
Sodium: 134 mmol/L — ABNORMAL LOW (ref 135–145)

## 2017-08-12 LAB — MAGNESIUM: Magnesium: 3.3 mg/dL — ABNORMAL HIGH (ref 1.7–2.4)

## 2017-08-12 LAB — CBC
HCT: 22.8 % — ABNORMAL LOW (ref 39.0–52.0)
HEMOGLOBIN: 7.3 g/dL — AB (ref 13.0–17.0)
MCH: 30.5 pg (ref 26.0–34.0)
MCHC: 32 g/dL (ref 30.0–36.0)
MCV: 95.4 fL (ref 78.0–100.0)
Platelets: 208 10*3/uL (ref 150–400)
RBC: 2.39 MIL/uL — AB (ref 4.22–5.81)
RDW: 16.5 % — ABNORMAL HIGH (ref 11.5–15.5)
WBC: 7.8 10*3/uL (ref 4.0–10.5)

## 2017-08-12 LAB — PREPARE RBC (CROSSMATCH)

## 2017-08-12 LAB — HEMOGLOBIN AND HEMATOCRIT, BLOOD
HCT: 24.9 % — ABNORMAL LOW (ref 39.0–52.0)
Hemoglobin: 8.3 g/dL — ABNORMAL LOW (ref 13.0–17.0)

## 2017-08-12 LAB — TROPONIN I
TROPONIN I: 0.06 ng/mL — AB (ref <0.03)
TROPONIN I: 0.11 ng/mL — AB (ref <0.03)

## 2017-08-12 MED ORDER — ONDANSETRON HCL 4 MG/2ML IJ SOLN: 4.0000 mg | Freq: Four times a day (QID) | INTRAMUSCULAR | Status: DC | PRN

## 2017-08-12 MED ORDER — CALCIUM CARBONATE ANTACID 500 MG PO CHEW
3.0000 | CHEWABLE_TABLET | Freq: Two times a day (BID) | ORAL | Status: DC
  Administered 2017-08-13: 600 mg via ORAL
  Filled 2017-08-12: qty 3

## 2017-08-12 MED ORDER — LEVALBUTEROL HCL 0.63 MG/3ML IN NEBU
0.6300 mg | INHALATION_SOLUTION | Freq: Four times a day (QID) | RESPIRATORY_TRACT | Status: DC
  Filled 2017-08-12: qty 3

## 2017-08-12 MED ORDER — SODIUM BICARBONATE 650 MG PO TABS
1300.0000 mg | ORAL_TABLET | Freq: Every day | ORAL | Status: DC
  Administered 2017-08-13: 1300 mg via ORAL
  Filled 2017-08-12: qty 2

## 2017-08-12 MED ORDER — DORZOLAMIDE HCL 2 % OP SOLN
2.0000 [drp] | Freq: Every day | OPHTHALMIC | Status: DC
Start: 2017-08-12 — End: 2017-08-13
  Filled 2017-08-12: qty 10

## 2017-08-12 MED ORDER — ACETAMINOPHEN 650 MG RE SUPP: 650.0000 mg | Freq: Four times a day (QID) | RECTAL | Status: DC | PRN

## 2017-08-12 MED ORDER — LATANOPROST 0.005 % OP SOLN
1.0000 [drp] | Freq: Every day | OPHTHALMIC | Status: DC
Start: 2017-08-12 — End: 2017-08-13
  Filled 2017-08-12: qty 2.5

## 2017-08-12 MED ORDER — PANTOPRAZOLE SODIUM 40 MG PO TBEC
40.0000 mg | DELAYED_RELEASE_TABLET | Freq: Every day | ORAL | Status: DC
Start: 2017-08-12 — End: 2017-08-13
  Administered 2017-08-13: 40 mg via ORAL
  Filled 2017-08-12: qty 1

## 2017-08-12 MED ORDER — METOPROLOL TARTRATE 100 MG PO TABS
100.0000 mg | ORAL_TABLET | Freq: Two times a day (BID) | ORAL | Status: DC
  Administered 2017-08-12 – 2017-08-13 (×2): 100 mg via ORAL
  Filled 2017-08-12 (×2): qty 1

## 2017-08-12 MED ORDER — FERROUS SULFATE 325 (65 FE) MG PO TABS
325.0000 mg | ORAL_TABLET | Freq: Two times a day (BID) | ORAL | Status: DC
  Administered 2017-08-13: 325 mg via ORAL
  Filled 2017-08-12: qty 1

## 2017-08-12 MED ORDER — ONDANSETRON HCL 4 MG PO TABS: 4.0000 mg | ORAL_TABLET | Freq: Four times a day (QID) | ORAL | Status: DC | PRN

## 2017-08-12 MED ORDER — SODIUM CHLORIDE 0.9 % IV SOLN
Freq: Once | INTRAVENOUS | Status: AC
  Administered 2017-08-12: 13:00:00 via INTRAVENOUS

## 2017-08-12 MED ORDER — ACETAMINOPHEN 325 MG PO TABS: 650.0000 mg | ORAL_TABLET | Freq: Four times a day (QID) | ORAL | Status: DC | PRN

## 2017-08-12 NOTE — ED Provider Notes (Signed)
[facility] EMERGENCY DEPARTMENT Provider Note   CSN: [id number] Arrival date & time: 08/12/17  7893     History   Chief Complaint Chief Complaint  Patient presents with  . Chest Pain    HPI Charls Custer Cantin is a 76 y.o. male.  Semir Brill Gambone is a 76 y.o. Male who presents to the ED complaining of chest pain starting at 5 am this morning. Patient reports he is lying in bed when he developed chest pain across his chest that lasted less than an hour. He reports his pain began to resolve prior to EMS arrival and then completely resolved after receiving 324 mg of aspirin by EMS on their arrival. He reports he is currently chest pain-free. He reports he had a similar situation happened to him last month and it was determined to be caused by low hemoglobin. He has chronic kidney disease stage IV and tells me he has been eligible for dialysis for more than 8 years. He has been refusing dialysis.  He denies any associated shortness of breath or worsening cough. He reports chronic diarrhea for the past 8-10 years. 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 He denies history of MI or stenting. No history of DVT or PE. He denies fevers, worsening cough, shortness of breath, abdominal pain, nausea, vomiting, rashes, syncope.    The history is provided by the patient and medical records. No language interpreter was used.  Chest Pain   Pertinent negatives include no abdominal pain, no back pain, no cough, no fever, no headaches, no nausea, no palpitations, no shortness of breath and no vomiting.    Past Medical History:  Diagnosis Date  . Bladder cancer ([place])   . Chronic kidney disease   . H/O sinus tachycardia   . Hypertension   . Prediabetes   . Secondary hyperparathyroidism [facility])     Patient Active Problem List   Diagnosis Date Noted  . Chest pain 08/12/2017  . Diarrhea 07/22/2017  . Hyponatremia 07/22/2017  . Hypokalemia 07/22/2017  . Elevated troponin 07/22/2017  . Chest  discomfort 07/22/2017  . Hyperkalemia 07/22/2017  . Symptomatic anemia 07/21/2017  . Glaucoma 07/21/2017  . UTI (lower urinary tract infection) 06/19/2016  . Sepsis ([place]) 06/19/2016  . Hyperglycemia 06/19/2016  . Hypertension 06/19/2016  . AKI (acute kidney injury) ([place]) 06/19/2016  . CKD (chronic kidney disease), stage V ([place]) 06/19/2016  . End stage renal disease ([place]) 03/22/2012    Past Surgical History:  Procedure Laterality Date  . ARTERIOVENOUS GRAFT PLACEMENT     left arm AVG  . AV FISTULA PLACEMENT  03/30/2012   Procedure: ARTERIOVENOUS (AV) FISTULA CREATION;  Surgeon: Angelia Mould, MD;  Location: [facility];  Service: Vascular;  Laterality: Left;  . CYSTECTOMY     w/ ileal diversion for bladder cancer   . TONSILLECTOMY         Home Medications    Prior to Admission medications   Medication Sig Start Date End Date Taking? Authorizing Provider  calcium carbonate (TUMS EX) 750 MG chewable tablet Chew 3 tablets by mouth See admin instructions. 3 tabs at lunch and 3 tabs at supper   Yes [provider]  dorzolamide (TRUSOPT) 2 % ophthalmic solution Place 2 drops into the right eye daily.   Yes [provider]  ferrous sulfate (IRON SUPPLEMENT) 325 (65 FE) MG tablet Take 325 mg by mouth 2 (two) times daily with a meal. Takes with lunch and supper   Yes  [provider]  furosemide (LASIX) 80 MG tablet Take 80 mg by mouth daily.    Yes [provider]  latanoprost (XALATAN) 0.005 % ophthalmic solution Place 1 drop into both eyes daily. Takes at 5 PM   Yes [provider]  metoprolol tartrate (LOPRESSOR) 100 MG tablet Take 100 mg by mouth 2 (two) times daily. Take at lunch and bedtime   Yes [provider]  sodium bicarbonate 650 MG tablet Take 1,300 mg by mouth daily.    Yes [provider]    Family History Family History  Problem Relation Age of Onset  . Cancer Mother        BRAIN    Social  History Social History  Substance Use Topics  . Smoking status: Former Smoker    Packs/day: 2.00    Years: 40.00    Types: Cigarettes, Pipe    Quit date: 03/22/2006  . Smokeless tobacco: Never Used     Comment: Stopped smoking 10 years ago.  . Alcohol use 19.2 oz/week    7 Cans of beer, 25 Glasses of wine per week     Allergies   Patient has no known allergies.   Review of Systems Review of Systems  Constitutional: Negative for chills and fever.  HENT: Negative for congestion and sore throat.   Eyes: Negative for visual disturbance.  Respiratory: Negative for cough, shortness of breath and wheezing.   Cardiovascular: Positive for chest pain. Negative for palpitations and leg swelling.  Gastrointestinal: Positive for diarrhea (chronic ). Negative for abdominal pain, nausea and vomiting.  Genitourinary:       Urostomy   Musculoskeletal: Negative for back pain and neck pain.  Skin: Negative for rash.  Neurological: Negative for headaches.     Physical Exam Updated Vital Signs BP (!) 138/59   Pulse (!) 102   Temp (!) 97.5 F (36.4 C) (Oral)   Resp 16   SpO2 100%   Physical Exam  Constitutional: He appears well-developed and well-nourished. No distress.  Nontoxic appearing.  HENT:  Head: Normocephalic and atraumatic.  Mouth/Throat: Oropharynx is clear and moist.  Eyes: Pupils are equal, round, and reactive to light. Conjunctivae are normal. Right eye exhibits no discharge. Left eye exhibits no discharge.  Neck: Neck supple. No JVD present.  Cardiovascular: Normal rate, regular rhythm, normal heart sounds and intact distal pulses.  Exam reveals no gallop and no friction rub.   No murmur heard. Heart rate is 96.  Pulmonary/Chest: Effort normal and breath sounds normal. No stridor. No respiratory distress. He has no wheezes. He has no rales. He exhibits no tenderness.  Lungs are clear to ascultation bilaterally. Symmetric chest expansion bilaterally. No increased work  of breathing. No rales or rhonchi.    Abdominal: Soft. There is no tenderness. There is no guarding.  Musculoskeletal: He exhibits no edema or tenderness.  No LE edema or TTP.   Lymphadenopathy:    He has no cervical adenopathy.  Neurological: He is alert. Coordination normal.  Skin: Skin is warm and dry. No rash noted. He is not diaphoretic. No erythema. No pallor.  Psychiatric: He has a normal mood and affect. His behavior is normal.  Nursing note and vitals reviewed.    ED Treatments / Results  Labs (all labs ordered are listed, but only abnormal results are displayed) Labs Reviewed  BASIC METABOLIC PANEL - Abnormal; Notable for the following:       Result Value   Sodium 134 (*)  Chloride 98 (*)    CO2 17 (*)    BUN 100 (*)    Creatinine, Ser 7.64 (*)    Calcium 8.3 (*)    GFR calc non Af Amer 6 (*)    GFR calc Af Amer 7 (*)    Anion gap 19 (*)    All other components within normal limits  CBC - Abnormal; Notable for the following:    RBC 2.39 (*)    Hemoglobin 7.3 (*)    HCT 22.8 (*)    RDW 16.5 (*)    All other components within normal limits  MAGNESIUM - Abnormal; Notable for the following:    Magnesium 3.3 (*)    All other components within normal limits  HEPATIC FUNCTION PANEL - Abnormal; Notable for the following:    Albumin 3.0 (*)    AST 12 (*)    ALT 11 (*)    Bilirubin, Direct <0.1 (*)    All other components within normal limits  TROPONIN I - Abnormal; Notable for the following:    Troponin I 0.06 (*)    All other components within normal limits  TROPONIN I  TROPONIN I  I-STAT TROPONIN, ED  TYPE AND SCREEN  PREPARE RBC (CROSSMATCH)    EKG  EKG Interpretation  Date/Time:  Friday August 12 2017 07:05:34 EDT Ventricular Rate:  93 PR Interval:    QRS Duration: 138 QT Interval:  427 QTC Calculation: 532 R Axis:   94 Text Interpretation:  Sinus rhythm Right bundle branch block Nonspecific repol abnormality, lateral leads Confirmed by Tanna Furry [phone]) on 08/12/2017 3:03:30 PM       Radiology Dg Chest 2 View  Result Date: 08/12/2017 CLINICAL DATA:  Chest pain EXAM: CHEST  2 VIEW COMPARISON:  07/21/2017 FINDINGS: There is hyperinflation of the lungs compatible with COPD. Peribronchial thickening. No confluent opacities or effusions. Heart is normal size. IMPRESSION: COPD/chronic bronchitic changes. Electronically Signed   By: Rolm Baptise M.D.   On: 08/12/2017 07:59    Procedures Procedures (including critical care time)  CRITICAL CARE Performed by: Hanley Hays   Total critical care time: 40 minutes  Critical care time was exclusive of separately billable procedures and treating other patients.  Critical care was necessary to treat or prevent imminent or life-threatening deterioration.  Critical care was time spent personally by me on the following activities: development of treatment plan with patient and/or surrogate as well as nursing, discussions with consultants, evaluation of patient's response to treatment, examination of patient, obtaining history from patient or surrogate, ordering and performing treatments and interventions, ordering and review of laboratory studies, ordering and review of radiographic studies, pulse oximetry and re-evaluation of patient's condition.   Medications Ordered in ED Medications  acetaminophen (TYLENOL) tablet 650 mg (not administered)    Or  acetaminophen (TYLENOL) suppository 650 mg (not administered)  ondansetron (ZOFRAN) tablet 4 mg (not administered)    Or  ondansetron (ZOFRAN) injection 4 mg (not administered)  levalbuterol (XOPENEX) nebulizer solution 0.63 mg (not administered)  pantoprazole (PROTONIX) EC tablet 40 mg (not administered)  0.9 %  sodium chloride infusion ( Intravenous New Bag/Given 08/12/17 1258)     Initial Impression / Assessment and Plan / ED Course  I have reviewed the triage vital signs and the nursing notes.  Pertinent labs & imaging  results that were available during my care of the patient were reviewed by me and considered in my medical decision making (see chart for details).  This is a 76 y.o. Male who presents to the ED complaining of chest pain starting at 5 am this morning. Patient reports he is lying in bed when he developed chest pain across his chest that lasted less than an hour. He reports his pain began to resolve prior to EMS arrival and then completely resolved after receiving 324 mg of aspirin by EMS on their arrival. He reports he is currently chest pain-free. He reports he had a similar situation happened to him last month and it was determined to be caused by low hemoglobin. He has chronic kidney disease stage IV and tells me he has been eligible for dialysis for more than 8 years. He has been refusing dialysis.  He denies any associated shortness of breath or worsening cough. He reports chronic diarrhea for the past 8-10 years. 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 He denies history of MI or stenting.  On exam the patient is afebrile and nontoxic appearing. Lungs are clear to auscultation bilaterally. He has no tachypnea, tachycardia or hypoxia on exam. No lower extremity edema or tenderness. BMP is remarkable for a sodium of 134, potassium of 4.6, BUN of 100 and a creatinine of 7.64. Anion gap is 19. CBC is remarkable for a hgb of 7.3. HCT 22.8.  Initial troponin is 0.05. No evidence of STEMI on EKG. Chest x-ray shows COPD and chronic bronchitic changes. I discussed findings with the patient. While he is reluctant to do dialysis he does want a blood transfusion. I discussed the risks associated with blood transfusion. He had a similar admission last month with anemia. Suspect this is related to him not being on dialysis. I discussed that I felt him during dialysis would likely help his anemia. He agrees with plan for admission. He'll need a repeat troponin for ACS rule out with his chest pain earlier today.  I  consulted with Dr. Allyson Sabal who accepted the patient for admission. She asked for consult to nephrology and this was paged out, but no call back received at this time.   This patient was discussed with Dr. Jeanell Sparrow who agrees with assessment and plan.   Final Clinical Impressions(s) / ED Diagnoses   Final diagnoses:  Precordial pain  Symptomatic anemia  ESRD (end stage renal disease) [facility])    New Prescriptions New Prescriptions   No medications on file     Waynetta Pean, Hershal Coria 08/12/17 1529    Pattricia Boss, MD 08/12/17 [phone]

## 2017-08-12 NOTE — Care Management Obs Status (Signed)
[facility] NOTIFICATION   Patient Details  Name: Matthew Mays MRN: [id number] Date of Birth: [birth year]   Medicare Observation Status Notification Given:  Yes    Carles Collet, RN 08/12/2017, 1:44 PM

## 2017-08-12 NOTE — ED Notes (Signed)
Pt transported to Xray. 

## 2017-08-12 NOTE — ED Triage Notes (Signed)
Pt comes from home via [facility] EMS for CP that started around 0500 and stopped about 6am. PTA received 324 ASA. No other cardiac symptoms. Pt states that he has a hx of the same when he has low Hbg

## 2017-08-12 NOTE — ED Notes (Signed)
Report called to theresa.

## 2017-08-12 NOTE — ED Notes (Signed)
Patient transported to X-ray 

## 2017-08-12 NOTE — H&P (Signed)
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 With the baseline Cr between 7.8 -8.5, BUN 100-110 ( no signif GFR diff).  Here with progressive weakness and intermittent CP substernal, without any aggravating or relieving factors.Chest pain has been ongoing for the last several weeks. Patient was recently admitted 07/21/17 with a hemoglobin of 5.8, received 3 units of packed red blood cells with a discharge hemoglobin of 8.7,also presented with shortness of breath and chest pain,symptoms improved after receiving blood transfusion. Symptoms resolved after transfusion. Patient's troponins were mildly elevated with a peak troponin of 0.27. Hospitalist at that time had discussed with cardiology, Dr. Oval Linsey who recommended outpatient evaluation be arranged with Dr. Tamala Julian.however patient has not seen cardiology since his last discharge. Patient is also not interested in any kind of intervention such as stress test or cardiac cath. He does not want to see cardiology today, and has declined this consult.he denies any hematochezia or melena. ED course BP (!) 148/60 (BP Location: Right Arm)   Pulse 95   Temp 97.8 F (36.6 C) (Oral)   Resp 19   SpO2 100%  EKG shows normal sinus rhythm without any significant ST-T changes, initial point of care troponin 0.05 Patient is being admitted primarily for blood transfusion for hemoglobin of 7.3 with the expectation that his chest pain will improve as his hemoglobin stays about 8.0. He will be brought in for observation    Review of Systems: negative for the following  Constitutional: Denies fever, chills, diaphoresis, appetite change and fatigue.  HEENT: Denies photophobia, eye  pain, redness, hearing loss, ear pain, congestion, sore throat, rhinorrhea, sneezing, mouth sores, trouble swallowing, neck pain, neck stiffness and tinnitus.  Respiratory: Denies SOB, DOE, cough, chest tightness, and wheezing.  Cardiovascular: Positive for chest pain. Negative for palpitations and leg swelling.  Gastrointestinal: Positive for diarrhea (chronic ). Negative for abdominal pain, nausea and vomiting Genitourinary: Denies dysuria, urgency, frequency, hematuria, flank pain and difficulty urinating.  Musculoskeletal: Denies myalgias, back pain, joint swelling, arthralgias and gait problem.  Skin: Denies pallor, rash and wound.  Neurological: Denies dizziness, seizures, syncope, weakness, light-headedness, numbness and headaches.  Hematological: Denies adenopathy. Easy bruising, personal or family bleeding history  Psychiatric/Behavioral: Denies suicidal ideation, mood changes, confusion, nervousness, sleep disturbance and agitation       Past Medical History:  Diagnosis Date  . Bladder cancer ([place])   . Chronic kidney disease   . H/O sinus tachycardia   . Hypertension   . Prediabetes   . Secondary hyperparathyroidism [facility])      Past Surgical History:  Procedure Laterality Date  . ARTERIOVENOUS GRAFT PLACEMENT     left arm AVG  . AV FISTULA PLACEMENT  03/30/2012   Procedure: ARTERIOVENOUS (AV) FISTULA CREATION;  Surgeon: Angelia Mould, MD;  Location: [facility];  Service: Vascular;  Laterality: Left;  . CYSTECTOMY     w/ ileal diversion for bladder cancer   . TONSILLECTOMY        Social History:  reports that he quit smoking about 11 years ago. His smoking use included Cigarettes and Pipe. He has a 80.00 pack-year smoking history. He has never used smokeless tobacco. He reports that he drinks about 19.2 oz of alcohol  per week . He reports that he does not use drugs.    No Known Allergies  Family History  Problem Relation Age of Onset  . Cancer Mother         BRAIN        Prior to Admission medications   Medication Sig Start Date End Date Taking? Authorizing Provider  calcium carbonate (TUMS EX) 750 MG chewable tablet Chew 3 tablets by mouth See admin instructions. 3 tabs at lunch and 3 tabs at supper   Yes [provider]  dorzolamide (TRUSOPT) 2 % ophthalmic solution Place 2 drops into the right eye daily.   Yes [provider]  ferrous sulfate (IRON SUPPLEMENT) 325 (65 FE) MG tablet Take 325 mg by mouth 2 (two) times daily with a meal. Takes with lunch and supper   Yes [provider]  furosemide (LASIX) 80 MG tablet Take 80 mg by mouth daily.    Yes [provider]  latanoprost (XALATAN) 0.005 % ophthalmic solution Place 1 drop into both eyes daily. Takes at 5 PM   Yes [provider]  metoprolol tartrate (LOPRESSOR) 100 MG tablet Take 100 mg by mouth 2 (two) times daily. Take at lunch and bedtime   Yes [provider]  sodium bicarbonate 650 MG tablet Take 1,300 mg by mouth daily.    Yes [provider]     Physical Exam: Vitals:   08/12/17 1000 08/12/17 1015 08/12/17 1030 08/12/17 1115  BP: (!) 147/52 (!) 152/50 (!) 135/53 (!) 150/52  Pulse: 86 85 84 85  Resp:      Temp:      TempSrc:      SpO2: 100% 100% 100% 100%        Vitals:   08/12/17 1000 08/12/17 1015 08/12/17 1030 08/12/17 1115  BP: (!) 147/52 (!) 152/50 (!) 135/53 (!) 150/52  Pulse: 86 85 84 85  Resp:      Temp:      TempSrc:      SpO2: 100% 100% 100% 100%   Constitutional: NAD, calm, comfortable Eyes: PERRL, lids and conjunctivae normal ENMT: Mucous membranes are moist. Posterior pharynx clear of any exudate or lesions.Normal dentition.  Neck: normal, supple, no masses, no thyromegaly Respiratory: Lungs are clear to ascultation bilaterally. Symmetric chest expansion bilaterally. No increased work of breathing. No rales or rhonchi.  Cardiovascular: Regular rate and rhythm, no murmurs / rubs /  gallops. No extremity edema. 2+ pedal pulses. No carotid bruits.  Abdomen: no tenderness, no masses palpated. No hepatosplenomegaly. Bowel sounds positive.  Musculoskeletal: no clubbing / cyanosis. No joint deformity upper and lower extremities. Good ROM, no contractures. Normal muscle tone.  Skin: no rashes, lesions, ulcers. No induration Neurologic: CN 2-12 grossly intact. Sensation intact, DTR normal. Strength 5/5 in all 4.  Psychiatric: Normal judgment and insight. Alert and oriented x 3. Normal mood.     Labs on Admission: I have personally reviewed following labs and imaging studies  CBC:  Recent Labs Lab 08/12/17 0730  WBC 7.8  HGB 7.3*  HCT 22.8*  MCV 95.4  PLT 166    Basic Metabolic Panel:  Recent Labs Lab 08/12/17 0730  NA 134*  K 4.6  CL 98*  CO2 17*  GLUCOSE 97  BUN 100*  CREATININE 7.64*  CALCIUM 8.3*    GFR: CrCl cannot be calculated (Unknown ideal weight.).  Liver Function Tests: No results for input(s): AST, ALT, ALKPHOS, BILITOT, PROT, ALBUMIN in the last 168 hours. No  results for input(s): LIPASE, AMYLASE in the last 168 hours. No results for input(s): AMMONIA in the last 168 hours.  Coagulation Profile: No results for input(s): INR, PROTIME in the last 168 hours. No results for input(s): DDIMER in the last 72 hours.  Cardiac Enzymes: No results for input(s): CKTOTAL, CKMB, CKMBINDEX, TROPONINI in the last 168 hours.  BNP (last 3 results) No results for input(s): PROBNP in the last 8760 hours.  HbA1C: No results for input(s): HGBA1C in the last 72 hours. Lab Results  Component Value Date   HGBA1C 5.5 06/19/2016     CBG: No results for input(s): GLUCAP in the last 168 hours.  Lipid Profile: No results for input(s): CHOL, HDL, LDLCALC, TRIG, CHOLHDL, LDLDIRECT in the last 72 hours.  Thyroid Function Tests: No results for input(s): TSH, T4TOTAL, FREET4, T3FREE, THYROIDAB in the last 72 hours.  Anemia Panel: No results for  input(s): VITAMINB12, FOLATE, FERRITIN, TIBC, IRON, RETICCTPCT in the last 72 hours.  Urine analysis:    Component Value Date/Time   COLORURINE YELLOW 06/19/2016 0936   APPEARANCEUR HAZY (A) 06/19/2016 0936   LABSPEC 1.014 06/19/2016 0936   PHURINE 6.5 06/19/2016 0936   GLUCOSEU NEGATIVE 06/19/2016 0936   HGBUR MODERATE (A) 06/19/2016 0936   BILIRUBINUR NEGATIVE 06/19/2016 0936   KETONESUR NEGATIVE 06/19/2016 0936   PROTEINUR 100 (A) 06/19/2016 0936   NITRITE NEGATIVE 06/19/2016 0936   LEUKOCYTESUR MODERATE (A) 06/19/2016 0936    Sepsis Labs: @LABRCNTIP (procalcitonin:4,lacticidven:4) )No results found for this or any previous visit (from the past 240 hour(s)).       Radiological Exams on Admission: Dg Chest 2 View  Result Date: 08/12/2017 CLINICAL DATA:  Chest pain EXAM: CHEST  2 VIEW COMPARISON:  07/21/2017 FINDINGS: There is hyperinflation of the lungs compatible with COPD. Peribronchial thickening. No confluent opacities or effusions. Heart is normal size. IMPRESSION: COPD/chronic bronchitic changes. Electronically Signed   By: Rolm Baptise M.D.   On: 08/12/2017 07:59   Dg Chest 2 View  Result Date: 08/12/2017 CLINICAL DATA:  Chest pain EXAM: CHEST  2 VIEW COMPARISON:  07/21/2017 FINDINGS: There is hyperinflation of the lungs compatible with COPD. Peribronchial thickening. No confluent opacities or effusions. Heart is normal size. IMPRESSION: COPD/chronic bronchitic changes. Electronically Signed   By: Rolm Baptise M.D.   On: 08/12/2017 07:59   Dg Chest 2 View  Result Date: 07/21/2017 CLINICAL DATA:  Chest pain.  Productive cough. EXAM: CHEST  2 VIEW COMPARISON:  03/16/2017 .  06/19/2016 . FINDINGS: Mediastinum and hilar structures normal. Cardiomegaly with normal pulmonary vascularity. Mild infiltrate right lung base again noted. No pleural effusion or pneumothorax. IMPRESSION: Mild right base pulmonary infiltrate again noted. Mild pneumonia cannot be excluded .  Electronically Signed   By: Marcello Moores  Register   On: 07/21/2017 15:07      EKG: Independently reviewed.    Assessment/Plan Anemia of chronic disease Patient is being admitted for a hemoglobin of 7.3 baseline hemoglobin is around 8.1 He is status post transfusion of 3 units of packed red blood cells and of September Patient will receive 1 unit of PRBC today Will repeat hemoglobin tomorrow if chest pain has resolved, anticipate that the patient will go home tomorrow Check FOBT, start concomitant PPI Avoid heparin/Lovenox Recent anemia panel showed Iron of 60, UIBC of 179, TIBC of 239, Saturation Ratio of 25, Folate of 10,3, and Vitamin B12 of 450 Nephrology giving Darbepoetin Alfa 200 mcg q Every Thursday  Chest pain-likely secondary to anemia in setting  of possible coronary artery disease however patient declines workup, not a candidate for antiplatelet therapy given anemia or PCI, will obtain 2-D echo to ensure that the patient does not have any wall motion abnormalities  Chronic kidney disease, stage V, previous history of bladder cancer, has refused dialysis on multiple occasions, will not consult nephrology unless there is a significant change in his renal function Continue Lasix 80 mg a day and sodium bicarbonate  Hypertension  continue metoprolol     DVT prophylaxis: *scd's      Code Status Orders full code        consults called:  None   Family Communication: Admission, patients condition and plan of care including tests being ordered have been discussed with the patient  who indicates understanding and agree with the plan and Code Status  Admission status: observation     Disposition plan: Further plan will depend as patient's clinical course evolves and further radiologic and laboratory data become available. Likely home when stable   At the time of admission, it appears that the appropriate admission status for this patient is INPATIENT .Thisis judged to be  reasonable and necessary in order to provide the required intensity of service to ensure the patient's safetygiven thepresenting symptoms, physical exam findings, and initial radiographic and laboratory data in the context of their chronic comorbidities.   Reyne Dumas MD [facility] Pager [phone]  If 7PM-7AM, please contact night-coverage [email or web address] Password [email or web address]  08/12/2017, 11:21 AM

## 2017-08-12 NOTE — ED Notes (Signed)
Pt ambulated to restroom with assisted due to having an accidental BM. Pt able to clean himself per request of pt and applied clean brief.

## 2017-08-13 ENCOUNTER — Observation Stay (HOSPITAL_BASED_OUTPATIENT_CLINIC_OR_DEPARTMENT_OTHER): Payer: Medicare Other

## 2017-08-13 DIAGNOSIS — N185 Chronic kidney disease, stage 5: Secondary | ICD-10-CM | POA: Diagnosis not present

## 2017-08-13 DIAGNOSIS — R079 Chest pain, unspecified: Secondary | ICD-10-CM

## 2017-08-13 DIAGNOSIS — I351 Nonrheumatic aortic (valve) insufficiency: Secondary | ICD-10-CM

## 2017-08-13 DIAGNOSIS — I340 Nonrheumatic mitral (valve) insufficiency: Secondary | ICD-10-CM

## 2017-08-13 DIAGNOSIS — R0789 Other chest pain: Secondary | ICD-10-CM | POA: Diagnosis not present

## 2017-08-13 DIAGNOSIS — N186 End stage renal disease: Secondary | ICD-10-CM | POA: Diagnosis not present

## 2017-08-13 DIAGNOSIS — I350 Nonrheumatic aortic (valve) stenosis: Secondary | ICD-10-CM

## 2017-08-13 HISTORY — DX: Nonrheumatic aortic (valve) stenosis: I35.0

## 2017-08-13 HISTORY — DX: Nonrheumatic aortic (valve) insufficiency: I35.1

## 2017-08-13 HISTORY — DX: Nonrheumatic mitral (valve) insufficiency: I34.0

## 2017-08-13 LAB — COMPREHENSIVE METABOLIC PANEL
ALT: 11 U/L — ABNORMAL LOW (ref 17–63)
AST: 11 U/L — ABNORMAL LOW (ref 15–41)
Albumin: 2.8 g/dL — ABNORMAL LOW (ref 3.5–5.0)
Alkaline Phosphatase: 69 U/L (ref 38–126)
Anion gap: 13 (ref 5–15)
BILIRUBIN TOTAL: 0.7 mg/dL (ref 0.3–1.2)
BUN: 108 mg/dL — AB (ref 6–20)
CHLORIDE: 104 mmol/L (ref 101–111)
CO2: 19 mmol/L — ABNORMAL LOW (ref 22–32)
Calcium: 7.6 mg/dL — ABNORMAL LOW (ref 8.9–10.3)
Creatinine, Ser: 7.87 mg/dL — ABNORMAL HIGH (ref 0.61–1.24)
GFR calc non Af Amer: 6 mL/min — ABNORMAL LOW (ref >60)
GFR, EST AFRICAN AMERICAN: 7 mL/min — AB (ref >60)
Glucose, Bld: 104 mg/dL — ABNORMAL HIGH (ref 65–99)
POTASSIUM: 5 mmol/L (ref 3.5–5.1)
Sodium: 136 mmol/L (ref 135–145)
TOTAL PROTEIN: 6.5 g/dL (ref 6.5–8.1)

## 2017-08-13 LAB — TYPE AND SCREEN
ABO/RH(D): A POS
ANTIBODY SCREEN: NEGATIVE
UNIT DIVISION: 0

## 2017-08-13 LAB — ECHOCARDIOGRAM COMPLETE
AV Area VTI index: 0.64 cm2/m2
AV Area mean vel: 1.16 cm2
AV Mean grad: 18 mmHg
AV peak Index: 0.59
AV pk vel: 295 cm/s
AVAREAMEANVIN: 0.6 cm2/m2
AVAREAVTI: 1.13 cm2
AVCELMEANRAT: 0.34
AVPG: 35 mmHg
Ao pk vel: 0.33 m/s
Ao-asc: 28 cm
CHL CUP AV VEL: 1.24
CHL CUP DOP CALC LVOT VTI: 22.9 cm
CHL CUP MV DEC (S): 155
DOP CAL AO MEAN VELOCITY: 197 cm/s
E decel time: 155 msec
E/e' ratio: 17.66
FS: 24 % — AB (ref 28–44)
HEIGHTINCHES: 70 in
IV/PV OW: 1
LA ID, A-P, ES: 44 mm
LA diam index: 2.29 cm/m2
LA vol A4C: 39.5 ml
LA vol: 54.8 mL
LAVOLIN: 28.5 mL/m2
LDCA: 3.46 cm2
LEFT ATRIUM END SYS DIAM: 44 mm
LV E/e'average: 17.66
LV TDI E'LATERAL: 6.74
LV e' LATERAL: 6.74 cm/s
LVEEMED: 17.66
LVOT SV: 79 mL
LVOT peak VTI: 0.36 cm
LVOT peak grad rest: 4 mmHg
LVOT peak vel: 96 cm/s
LVOTD: 21 mm
MVAP: 4.89 cm2
MVPG: 6 mmHg
MVPKAVEL: 162 m/s
MVPKEVEL: 119 m/s
P 1/2 time: 359 ms
P 1/2 time: 45 ms
PW: 11 mm — AB (ref 0.6–1.1)
RV LATERAL S' VELOCITY: 13.3 cm/s
TAPSE: 15.8 mm
TDI e' medial: 6.85
VTI: 63.8 cm
Valve area index: 0.64
Valve area: 1.24 cm2
WEIGHTICAEL: 2631.41 [oz_av]

## 2017-08-13 LAB — CBC
HEMATOCRIT: 24.2 % — AB (ref 39.0–52.0)
Hemoglobin: 7.8 g/dL — ABNORMAL LOW (ref 13.0–17.0)
MCH: 30.7 pg (ref 26.0–34.0)
MCHC: 32.2 g/dL (ref 30.0–36.0)
MCV: 95.3 fL (ref 78.0–100.0)
PLATELETS: 167 10*3/uL (ref 150–400)
RBC: 2.54 MIL/uL — AB (ref 4.22–5.81)
RDW: 16.7 % — AB (ref 11.5–15.5)
WBC: 7.5 10*3/uL (ref 4.0–10.5)

## 2017-08-13 LAB — BPAM RBC
BLOOD PRODUCT EXPIRATION DATE: 201811072359
ISSUE DATE / TIME: 201810191244
UNIT TYPE AND RH: 6200

## 2017-08-13 LAB — TROPONIN I: TROPONIN I: 0.12 ng/mL — AB (ref <0.03)

## 2017-08-13 MED ORDER — PANTOPRAZOLE SODIUM 40 MG PO TBEC: 40.0000 mg | DELAYED_RELEASE_TABLET | Freq: Every day | ORAL | 2 refills | Status: DC

## 2017-08-13 NOTE — Discharge Summary (Signed)
Physician Discharge Summary  Matthew Mays KGU:[id number] DOB: [birth year] DOA: 08/12/2017  PCP: Estanislado Emms, MD  Admit date: 08/12/2017 Discharge date: 08/13/2017  Time spent: 45 minutes  Recommendations for Outpatient Follow-up:  -Will be discharged home today. -Refuses cardiac testing now or in the future.  Discharge Diagnoses:  Principal Problem:   Chest pain Active Problems:   End stage renal disease (HCC)   Hypertension   CKD (chronic kidney disease), stage V ([facility])   Symptomatic anemia   Discharge Condition: Stable and improved  Filed Weights   08/12/17 1709 08/13/17 0300  Weight: 75.2 kg (165 lb 11.2 oz) 74.6 kg (164 lb 7.4 oz)    History of present illness:  As per Dr. 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 With the baselineCr between 7.8 -8.5, BUN 100-110 ( no signif GFR diff). Here with progressive weakness and intermittent CP substernal, without any aggravating or relieving factors.Chest pain has been ongoing for the last several weeks. Patient was recently admitted 07/21/17 with a hemoglobin of 5.8, received 3 units of packed red blood cells with a discharge hemoglobin of 8.7,also presented with shortness of breath and chest pain,symptoms improved after receiving blood transfusion. Symptoms resolved after transfusion. Patient's troponins were mildly elevated with a peak troponin of 0.27. Hospitalist at that time had discussed with cardiology, Dr. Oval Linsey who recommended outpatient evaluation be arranged with Dr. Tamala Julian.however patient has not seen cardiology since his last discharge. Patient is also not interested in any kind of intervention such as stress test or cardiac cath. He does not want to see cardiology today, and has declined this consult.he denies any hematochezia or melena. ED course BP (!) 148/60 (BP Location: Right Arm)  Pulse 95   Temp 97.8 F (36.6 C) (Oral)  Resp 19  SpO2 100%  EKG shows normal sinus rhythm without any significant ST-T changes, initial point of care troponin 0.05 Patient is being admitted primarily for blood transfusion for hemoglobin of 7.3 with the expectation that his chest pain will improve as his hemoglobin stays about 8.0. He will be brought in for observation  Hospital Course:   Chest pain -Likely secondary to anemia in setting of possibly coronary artery disease, however patient declines cardiac testing now or in the future. -Troponins are 0.06, 0.11, 0.12, likely due to demand ischemia in the setting of profound anemia. -2-D echo shows an ejection fraction of 55-60% with grade 1 diastolic dysfunction and no wall motion abnormalities.also mild to moderate aortic stenosis.  Anemia of chronic disease -On admission hemoglobin was 7.3, baseline hemoglobin is around 8. Of note he did receive transfusion of 3 units of PRBCs during a hospitalization in September. -chronic disease anemia is likely secondary to chronic kidney disease, patient is receiving darbepoetin at the instruction of nephrology once a week.  Chronic kidney disease stage V -creatinine is at baseline.  Procedures:  None   Consultations:  None  Discharge Instructions  Discharge Instructions    Diet - low sodium heart healthy    Complete by:  As directed    Increase activity slowly    Complete by:  As directed      Allergies as of 08/13/2017   No Known Allergies     Medication List    TAKE these medications   calcium carbonate 750 MG chewable tablet Commonly known as:  TUMS EX Chew 3 tablets by mouth  See admin instructions. 3 tabs at lunch and 3 tabs at supper   dorzolamide 2 % ophthalmic solution Commonly known as:  TRUSOPT Place 2 drops into the right eye daily.   furosemide 80 MG tablet Commonly known as:  LASIX Take 80 mg by mouth daily.   IRON SUPPLEMENT 325 (65 FE) MG tablet Generic drug:   ferrous sulfate Take 325 mg by mouth 2 (two) times daily with a meal. Takes with lunch and supper   latanoprost 0.005 % ophthalmic solution Commonly known as:  XALATAN Place 1 drop into both eyes daily. Takes at 5 PM   metoprolol tartrate 100 MG tablet Commonly known as:  LOPRESSOR Take 100 mg by mouth 2 (two) times daily. Take at lunch and bedtime   pantoprazole 40 MG tablet Commonly known as:  PROTONIX Take 1 tablet (40 mg total) by mouth daily.   sodium bicarbonate 650 MG tablet Take 1,300 mg by mouth daily.      No Known Allergies Follow-up Information    Estanislado Emms, MD. Schedule an appointment as soon as possible for a visit in 2 week(s).   Specialty:  Nephrology Contact information: [place] [address] [phone]            The results of significant diagnostics from this hospitalization (including imaging, microbiology, ancillary and laboratory) are listed below for reference.    Significant Diagnostic Studies: Dg Chest 2 View  Result Date: 08/12/2017 CLINICAL DATA:  Chest pain EXAM: CHEST  2 VIEW COMPARISON:  07/21/2017 FINDINGS: There is hyperinflation of the lungs compatible with COPD. Peribronchial thickening. No confluent opacities or effusions. Heart is normal size. IMPRESSION: COPD/chronic bronchitic changes. Electronically Signed   By: Rolm Baptise M.D.   On: 08/12/2017 07:59   Dg Chest 2 View  Result Date: 07/21/2017 CLINICAL DATA:  Chest pain.  Productive cough. EXAM: CHEST  2 VIEW COMPARISON:  03/16/2017 .  06/19/2016 . FINDINGS: Mediastinum and hilar structures normal. Cardiomegaly with normal pulmonary vascularity. Mild infiltrate right lung base again noted. No pleural effusion or pneumothorax. IMPRESSION: Mild right base pulmonary infiltrate again noted. Mild pneumonia cannot be excluded . Electronically Signed   By: Marcello Moores  Register   On: 07/21/2017 15:07    Microbiology: No results found for this  or any previous visit (from the past 240 hour(s)).   Labs: Basic Metabolic Panel:  Recent Labs Lab 08/12/17 0730 08/13/17 0024  NA 134* 136  K 4.6 5.0  CL 98* 104  CO2 17* 19*  GLUCOSE 97 104*  BUN 100* 108*  CREATININE 7.64* 7.87*  CALCIUM 8.3* 7.6*  MG 3.3*  --    Liver Function Tests:  Recent Labs Lab 08/12/17 0730 08/13/17 0024  AST 12* 11*  ALT 11* 11*  ALKPHOS 72 69  BILITOT 0.4 0.7  PROT 7.1 6.5  ALBUMIN 3.0* 2.8*   No results for input(s): LIPASE, AMYLASE in the last 168 hours. No results for input(s): AMMONIA in the last 168 hours. CBC:  Recent Labs Lab 08/12/17 0730 08/12/17 1818 08/13/17 0024  WBC 7.8  --  7.5  HGB 7.3* 8.3* 7.8*  HCT 22.8* 24.9* 24.2*  MCV 95.4  --  95.3  PLT 208  --  167   Cardiac Enzymes:  Recent Labs Lab 08/12/17 1155 08/12/17 1818 08/13/17 0024  TROPONINI 0.06* 0.11* 0.12*  BNP: BNP (last 3 results)  Recent Labs  03/16/17 0259 07/21/17 1416  BNP 680.7* 761.6*    ProBNP (last 3 results) No results for input(s): PROBNP in the last 8760 hours.  CBG: No results for input(s): GLUCAP in the last 168 hours.     SignedLelon Frohlich  [facility] Pager: [phone] 08/13/2017, 3:26 PM

## 2017-08-13 NOTE — Progress Notes (Signed)
  Echocardiogram 2D Echocardiogram has been performed.  [name] 08/13/2017, 9:30 AM

## 2017-08-21 ENCOUNTER — Emergency Department (HOSPITAL_COMMUNITY): Payer: Medicare Other

## 2017-08-21 ENCOUNTER — Encounter (HOSPITAL_COMMUNITY): Payer: Self-pay | Admitting: Emergency Medicine

## 2017-08-21 ENCOUNTER — Inpatient Hospital Stay (HOSPITAL_COMMUNITY)
Admission: EM | Admit: 2017-08-21 | Discharge: 2017-08-25 | DRG: 280 | Disposition: A | Discharge status: Hospice | Payer: Medicare Other | Attending: Internal Medicine | Admitting: Internal Medicine

## 2017-08-21 DIAGNOSIS — Z66 Do not resuscitate: Secondary | ICD-10-CM | POA: Diagnosis not present

## 2017-08-21 DIAGNOSIS — I4510 Unspecified right bundle-branch block: Secondary | ICD-10-CM | POA: Diagnosis present

## 2017-08-21 DIAGNOSIS — Z79899 Other long term (current) drug therapy: Secondary | ICD-10-CM

## 2017-08-21 DIAGNOSIS — N185 Chronic kidney disease, stage 5: Secondary | ICD-10-CM | POA: Diagnosis present

## 2017-08-21 DIAGNOSIS — R0789 Other chest pain: Secondary | ICD-10-CM | POA: Diagnosis present

## 2017-08-21 DIAGNOSIS — N186 End stage renal disease: Secondary | ICD-10-CM | POA: Diagnosis not present

## 2017-08-21 DIAGNOSIS — E1122 Type 2 diabetes mellitus with diabetic chronic kidney disease: Secondary | ICD-10-CM | POA: Diagnosis present

## 2017-08-21 DIAGNOSIS — Z95828 Presence of other vascular implants and grafts: Secondary | ICD-10-CM

## 2017-08-21 DIAGNOSIS — I214 Non-ST elevation (NSTEMI) myocardial infarction: Secondary | ICD-10-CM | POA: Diagnosis not present

## 2017-08-21 DIAGNOSIS — I120 Hypertensive chronic kidney disease with stage 5 chronic kidney disease or end stage renal disease: Secondary | ICD-10-CM | POA: Diagnosis present

## 2017-08-21 DIAGNOSIS — Z906 Acquired absence of other parts of urinary tract: Secondary | ICD-10-CM

## 2017-08-21 DIAGNOSIS — D638 Anemia in other chronic diseases classified elsewhere: Secondary | ICD-10-CM | POA: Diagnosis present

## 2017-08-21 DIAGNOSIS — E1165 Type 2 diabetes mellitus with hyperglycemia: Secondary | ICD-10-CM | POA: Diagnosis present

## 2017-08-21 DIAGNOSIS — Z8551 Personal history of malignant neoplasm of bladder: Secondary | ICD-10-CM

## 2017-08-21 DIAGNOSIS — R197 Diarrhea, unspecified: Secondary | ICD-10-CM | POA: Diagnosis present

## 2017-08-21 DIAGNOSIS — I083 Combined rheumatic disorders of mitral, aortic and tricuspid valves: Secondary | ICD-10-CM | POA: Diagnosis present

## 2017-08-21 DIAGNOSIS — R079 Chest pain, unspecified: Secondary | ICD-10-CM | POA: Diagnosis not present

## 2017-08-21 DIAGNOSIS — Z87891 Personal history of nicotine dependence: Secondary | ICD-10-CM

## 2017-08-21 DIAGNOSIS — R739 Hyperglycemia, unspecified: Secondary | ICD-10-CM | POA: Diagnosis present

## 2017-08-21 DIAGNOSIS — R9431 Abnormal electrocardiogram [ECG] [EKG]: Secondary | ICD-10-CM | POA: Diagnosis not present

## 2017-08-21 DIAGNOSIS — N2581 Secondary hyperparathyroidism of renal origin: Secondary | ICD-10-CM | POA: Diagnosis not present

## 2017-08-21 DIAGNOSIS — I089 Rheumatic multiple valve disease, unspecified: Secondary | ICD-10-CM | POA: Diagnosis present

## 2017-08-21 DIAGNOSIS — F419 Anxiety disorder, unspecified: Secondary | ICD-10-CM | POA: Diagnosis present

## 2017-08-21 DIAGNOSIS — D649 Anemia, unspecified: Secondary | ICD-10-CM | POA: Diagnosis not present

## 2017-08-21 DIAGNOSIS — Z7189 Other specified counseling: Secondary | ICD-10-CM

## 2017-08-21 DIAGNOSIS — Z515 Encounter for palliative care: Secondary | ICD-10-CM

## 2017-08-21 HISTORY — DX: Anemia, unspecified: D64.9

## 2017-08-21 HISTORY — DX: Non-ST elevation (NSTEMI) myocardial infarction: I21.4

## 2017-08-21 HISTORY — DX: Nonrheumatic aortic (valve) insufficiency: I35.1

## 2017-08-21 HISTORY — DX: Nonrheumatic mitral (valve) insufficiency: I34.0

## 2017-08-21 HISTORY — DX: Nonrheumatic aortic (valve) stenosis: I35.0

## 2017-08-21 LAB — CBC
HCT: 22.2 % — ABNORMAL LOW (ref 39.0–52.0)
Hemoglobin: 7.3 g/dL — ABNORMAL LOW (ref 13.0–17.0)
MCH: 31.6 pg (ref 26.0–34.0)
MCHC: 32.9 g/dL (ref 30.0–36.0)
MCV: 96.1 fL (ref 78.0–100.0)
PLATELETS: 163 10*3/uL (ref 150–400)
RBC: 2.31 MIL/uL — ABNORMAL LOW (ref 4.22–5.81)
RDW: 16.4 % — ABNORMAL HIGH (ref 11.5–15.5)
WBC: 8.9 10*3/uL (ref 4.0–10.5)

## 2017-08-21 LAB — BASIC METABOLIC PANEL
Anion gap: 16 — ABNORMAL HIGH (ref 5–15)
BUN: 107 mg/dL — AB (ref 6–20)
CALCIUM: 9.1 mg/dL (ref 8.9–10.3)
CO2: 19 mmol/L — ABNORMAL LOW (ref 22–32)
CREATININE: 7.5 mg/dL — AB (ref 0.61–1.24)
Chloride: 99 mmol/L — ABNORMAL LOW (ref 101–111)
GFR calc Af Amer: 7 mL/min — ABNORMAL LOW (ref >60)
GFR, EST NON AFRICAN AMERICAN: 6 mL/min — AB (ref >60)
Glucose, Bld: 209 mg/dL — ABNORMAL HIGH (ref 65–99)
Potassium: 4.3 mmol/L (ref 3.5–5.1)
SODIUM: 134 mmol/L — AB (ref 135–145)

## 2017-08-21 LAB — I-STAT TROPONIN, ED: TROPONIN I, POC: 0.04 ng/mL (ref 0.00–0.08)

## 2017-08-21 LAB — PREPARE RBC (CROSSMATCH)

## 2017-08-21 LAB — POC OCCULT BLOOD, ED: FECAL OCCULT BLD: POSITIVE — AB

## 2017-08-21 MED ORDER — DORZOLAMIDE HCL 2 % OP SOLN
1.0000 [drp] | Freq: Two times a day (BID) | OPHTHALMIC | Status: DC
  Administered 2017-08-22 – 2017-08-25 (×7): 1 [drp] via OPHTHALMIC
  Filled 2017-08-21: qty 10

## 2017-08-21 MED ORDER — METOPROLOL TARTRATE 100 MG PO TABS
100.0000 mg | ORAL_TABLET | Freq: Two times a day (BID) | ORAL | Status: DC
  Administered 2017-08-22 – 2017-08-25 (×8): 100 mg via ORAL
  Filled 2017-08-21 (×8): qty 1

## 2017-08-21 MED ORDER — ACETAMINOPHEN 650 MG RE SUPP: 650.0000 mg | Freq: Four times a day (QID) | RECTAL | Status: DC | PRN

## 2017-08-21 MED ORDER — FUROSEMIDE 80 MG PO TABS
80.0000 mg | ORAL_TABLET | Freq: Every day | ORAL | Status: DC
  Administered 2017-08-22 – 2017-08-25 (×4): 80 mg via ORAL
  Filled 2017-08-21 (×4): qty 1

## 2017-08-21 MED ORDER — INSULIN ASPART 100 UNIT/ML ~~LOC~~ SOLN
0.0000 [IU] | Freq: Three times a day (TID) | SUBCUTANEOUS | Status: DC
  Administered 2017-08-22: 1 [IU] via SUBCUTANEOUS

## 2017-08-21 MED ORDER — SODIUM BICARBONATE 650 MG PO TABS
650.0000 mg | ORAL_TABLET | Freq: Two times a day (BID) | ORAL | Status: DC
  Administered 2017-08-22 – 2017-08-25 (×7): 650 mg via ORAL
  Filled 2017-08-21 (×7): qty 1

## 2017-08-21 MED ORDER — SODIUM CHLORIDE 0.9 % IV SOLN
INTRAVENOUS | Status: DC
  Administered 2017-08-22: 05:00:00 via INTRAVENOUS

## 2017-08-21 MED ORDER — CALCIUM CARBONATE ANTACID 500 MG PO CHEW
4.0000 | CHEWABLE_TABLET | Freq: Two times a day (BID) | ORAL | Status: DC
Start: 2017-08-22 — End: 2017-08-25
  Administered 2017-08-22 – 2017-08-24 (×6): 800 mg via ORAL
  Filled 2017-08-21 (×6): qty 4

## 2017-08-21 MED ORDER — ACETAMINOPHEN 325 MG PO TABS: 650.0000 mg | ORAL_TABLET | Freq: Four times a day (QID) | ORAL | Status: DC | PRN

## 2017-08-21 MED ORDER — FERROUS SULFATE 325 (65 FE) MG PO TABS
325.0000 mg | ORAL_TABLET | Freq: Two times a day (BID) | ORAL | Status: DC
Start: 2017-08-22 — End: 2017-08-25
  Administered 2017-08-22 – 2017-08-25 (×7): 325 mg via ORAL
  Filled 2017-08-21 (×7): qty 1

## 2017-08-21 NOTE — ED Triage Notes (Signed)
Ems pt from home called out for chest pain pt took 81mg  ASA x 4 prior to ems arrival. Pt rates pain now 1/10 reports recent anemia requiring blood transfusion

## 2017-08-21 NOTE — H&P (Addendum)
TRH H&P   Patient Demographics:    Matthew Mays, is a 76 y.o. male  MRN: [id number]   DOB - September 26, 1941  Admit Date - 08/21/2017  Outpatient Primary MD for the patient is Estanislado Emms, MD  Referring MD/NP/PA: Elnora Morrison  Outpatient Specialists:   Daneen Schick (cardiology) in the past.  ? [facility] in the past ?  Patient coming from: home    Chief Complaint  Patient presents with  . Chest Pain      HPI:    Matthew Mays  is a 76 y.o. 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     Pt states that the chest pain is all across his upper chest and began tonight while he was sitting in his lazy boy.  Went from the left side to the right side of his chest,  "dull",, no radiation.  Pt denies fever, chills, cough, palp, sob, n/v, heartburn.  Pt states the pain lasted for about 45 minutes,  Nothing appeared to make the pain better or worse. Typically taking 4 baby aspirin causes the pain to go away and when it didn't he decided to call EMS.    Prior notes states that he didn't want cardiac evaluation but he is open to talking to cardiology about his options, as well as seeing GI about positive FOBT and anemia    In ED Wbc 8.0, Hgb 7.3, Plt 163,  Na 134 K 4.3 Bun 107, Creatinine 7.5 Glucose 209  CXR  IMPRESSION: Hyperinflation with mild hazy bibasilar atelectasis versus small infiltrates.  EKG pending Trop 0.04  FOBT  +  Pt will be admitted for symptomatic anemia and chest pain .    Review of systems:    In addition to the HPI above,  No Fever-chills, No Headache, No changes with Vision or hearing, No problems swallowing food or Liquids, No Cough or Shortness of Breath, No Abdominal pain, No Nausea or Vommitting,  + intermittent diarrhea + black stool from iron No Blood  in  Urine, No dysuria, No new skin rashes or bruises, No new joints pains-aches,  No new weakness, tingling, numbness in any extremity, No recent weight gain or loss, No polyuria, polydypsia or polyphagia, No significant Mental Stressors.  A full 10 point Review of Systems was done, except as stated above, all other Review of Systems were negative.   With Past History of the following :    Past Medical History:  Diagnosis Date  . Anemia   . Aortic regurgitation 08/13/2017   moderate  . Aortic stenosis 08/13/2017   mild  . Bladder cancer ([place])   . Chronic kidney disease   . H/O sinus tachycardia   . Hypertension   . Mitral regurgitation 08/13/2017   mild  .  Prediabetes   . Secondary hyperparathyroidism [facility])       Past Surgical History:  Procedure Laterality Date  . ARTERIOVENOUS GRAFT PLACEMENT     left arm AVG  . AV FISTULA PLACEMENT  03/30/2012   Procedure: ARTERIOVENOUS (AV) FISTULA CREATION;  Surgeon: Angelia Mould, MD;  Location: [place];  Service: Vascular;  Laterality: Left;  . CYSTECTOMY     w/ ileal diversion for bladder cancer   . TONSILLECTOMY        Social History:     Social History  Substance Use Topics  . Smoking status: Former Smoker    Packs/day: 2.00    Years: 40.00    Types: Cigarettes, Pipe    Quit date: 03/22/2006  . Smokeless tobacco: Never Used     Comment: Stopped smoking 10 years ago.  . Alcohol use 19.2 oz/week    7 Cans of beer, 25 Glasses of wine per week     Lives - at home  Mobility - walks by self   Family History :     Family History  Problem Relation Age of Onset  . Cancer Mother        BRAIN      Home Medications:   Prior to Admission medications   Medication Sig Start Date End Date Taking? Authorizing Provider  calcium carbonate (TUMS EX) 750 MG chewable tablet Chew 3 tablets by mouth 2 (two) times daily. LUNCH and SUPPER   Yes [provider]  dorzolamide (TRUSOPT) 2 % ophthalmic solution  Place 1 drop into the right eye 2 (two) times daily.    Yes [provider]  ferrous sulfate (IRON SUPPLEMENT) 325 (65 FE) MG tablet Take 325 mg by mouth 2 (two) times daily with a meal. LUNCH and SUPPER   Yes [provider]  furosemide (LASIX) 80 MG tablet Take 80 mg by mouth daily.    Yes [provider]  latanoprost (XALATAN) 0.005 % ophthalmic solution Place 1 drop into both eyes daily. 5 PM   Yes [provider]  metoprolol tartrate (LOPRESSOR) 100 MG tablet Take 100 mg by mouth 2 (two) times daily. LUNCH and BEDTIME   Yes [provider]  sodium bicarbonate 650 MG tablet Take 650 mg by mouth 2 (two) times daily.    Yes [provider]  pantoprazole (PROTONIX) 40 MG tablet Take 1 tablet (40 mg total) by mouth daily. Patient not taking: Reported on 08/21/2017 08/14/17   Isaac Bliss, Rayford Halsted, MD     Allergies:    No Known Allergies   Physical Exam:   Vitals  Blood pressure (!) 116/53, pulse 100, temperature 97.7 F (36.5 C), temperature source Oral, resp. rate 16, height 5\' 10"  (1.778 m), weight 74.8 kg (165 lb), SpO2 99 %.   1. General  lying in bed in NAD,   2. Normal affect and insight, Not Suicidal or Homicidal, Awake Alert, Oriented X 3.  3. No F.N deficits, ALL C.Nerves Intact, Strength 5/5 all 4 extremities, Sensation intact all 4 extremities, Plantars down going.  4. Ears and Eyes appear Normal, Conjunctivae clear, PERRLA. Moist Oral Mucosa.  5. Supple Neck, No JVD, No cervical lymphadenopathy appriciated, No Carotid Bruits.  6. Symmetrical Chest wall movement, Good air movement bilaterally, CTAB.  7. RRR,s1 s2, 2/6 sem rusb  8. Positive Bowel Sounds, Abdomen Soft, No tenderness, No organomegaly appriciated,No rebound -guarding or rigidity.  9.  No Cyanosis, Normal Skin Turgor, No Skin Rash or Bruise.  10.  Good muscle tone,  joints appear normal , no effusions, Normal ROM.  11. No Palpable Lymph Nodes in  Neck or Axillae  L arm AVG   Data Review:    CBC  Recent Labs Lab 08/21/17 2030  WBC 8.9  HGB 7.3*  HCT 22.2*  PLT 163  MCV 96.1  MCH 31.6  MCHC 32.9  RDW 16.4*   ------------------------------------------------------------------------------------------------------------------  Chemistries   Recent Labs Lab 08/21/17 2030  NA 134*  K 4.3  CL 99*  CO2 19*  GLUCOSE 209*  BUN 107*  CREATININE 7.50*  CALCIUM 9.1   ------------------------------------------------------------------------------------------------------------------ estimated creatinine clearance is 8.7 mL/min (A) (by C-G formula based on SCr of 7.5 mg/dL (H)). ------------------------------------------------------------------------------------------------------------------ No results for input(s): TSH, T4TOTAL, T3FREE, THYROIDAB in the last 72 hours.  Invalid input(s): FREET3  Coagulation profile No results for input(s): INR, PROTIME in the last 168 hours. ------------------------------------------------------------------------------------------------------------------- No results for input(s): DDIMER in the last 72 hours. -------------------------------------------------------------------------------------------------------------------  Cardiac Enzymes No results for input(s): CKMB, TROPONINI, MYOGLOBIN in the last 168 hours.  Invalid input(s): CK ------------------------------------------------------------------------------------------------------------------    Component Value Date/Time   BNP 761.6 (H) 07/21/2017 1416     ---------------------------------------------------------------------------------------------------------------  Urinalysis    Component Value Date/Time   COLORURINE YELLOW 06/19/2016 0936   APPEARANCEUR HAZY (A) 06/19/2016 0936   LABSPEC 1.014 06/19/2016 0936   PHURINE 6.5 06/19/2016 0936   GLUCOSEU NEGATIVE 06/19/2016 0936   HGBUR MODERATE (A) 06/19/2016 0936    BILIRUBINUR NEGATIVE 06/19/2016 0936   KETONESUR NEGATIVE 06/19/2016 0936   PROTEINUR 100 (A) 06/19/2016 0936   NITRITE NEGATIVE 06/19/2016 0936   LEUKOCYTESUR MODERATE (A) 06/19/2016 0936    ----------------------------------------------------------------------------------------------------------------   Imaging Results:    Dg Chest 2 View  Result Date: 08/21/2017 CLINICAL DATA:  Chest pain EXAM: CHEST  2 VIEW COMPARISON:  08/12/2017 FINDINGS: Hyper inflation. No large pleural effusion. Mild hazy bibasilar atelectasis or small infiltrates. Chronic bronchitic changes. Stable cardiomediastinal silhouette with atherosclerosis. IMPRESSION: Hyperinflation with mild hazy bibasilar atelectasis versus small infiltrates. Electronically Signed   By: Donavan Foil M.D.   On: 08/21/2017 21:34     Assessment & Plan:    Active Problems:   Chest pain    Cp Tele Trop I q6h x3 Prior Echo 08/13/2017 Cardiology consult requested by email to see if stress testing would be of benefit  Anemia with positive FOBT Pt seems to thinks has had prior colonoscopy by ? [facility] in the past.  NPO after midnite except for medications Please consult GI in am Transfusion ordered by ED.  Repeat cbc in am  ESRD not yet on HD Hydrate gently with ns iv since NPO in case GI would like to evaluate r/o GI blood loss Check cmp in am    Valvular heart disease (mild AS, mod AR, mild MR) Stable  Dm2 fsbs q4h, ISS Check hga1c  Diarrhea Intermittent Check stool studies for C. Diff, gi pathogen panel  DVT Prophylaxis  SCDs  AM Labs Ordered, also please review Full Orders  Family Communication: Admission, patients condition and plan of care including tests being ordered have been discussed with the patient  who indicate understanding and agree with the plan and Code Status.  Code Status FULL CODE  Likely DC to  home  Condition GUARDED    Consults called: cardiology by email, please consult GI in am  regarding + Fobt and anemia  Admission status: observation  Time spent in minutes : 45   Jani Gravel M.D on 08/21/2017  at 10:32 PM  Between 7am to 7pm - Pager - [phone]. After 7pm go to [email or web address] - password [facility]  [facility] - Office  [phone]

## 2017-08-21 NOTE — ED Provider Notes (Signed)
[place] EMERGENCY DEPARTMENT Provider Note   CSN: [id number] Arrival date & time: 08/21/17  2002     History   Chief Complaint Chief Complaint  Patient presents with  . Chest Pain    HPI Matthew Mays is a 76 y.o. male.  Patient with history of bladder cancer, kidney disease, high blood pressure, anemia requiring recent blood transfusion presents with fatigue and chest pain.  Similar to when he needed blood transfusion before.  Intermittent chest pain for which he has had in the past similar and medical management.  Patient does not want any provocative cardiac testing done.  Patient is interested in blood transfusion to improve symptoms.  Patient denies active bleeding.  Patient had multiple test to determine why worsening anemia aside from kidney disease.      Past Medical History:  Diagnosis Date  . Bladder cancer ([place])   . Chronic kidney disease   . H/O sinus tachycardia   . Hypertension   . Prediabetes   . Secondary hyperparathyroidism [facility])     Patient Active Problem List   Diagnosis Date Noted  . Chest pain 08/12/2017  . Diarrhea 07/22/2017  . Hyponatremia 07/22/2017  . Hypokalemia 07/22/2017  . Elevated troponin 07/22/2017  . Chest discomfort 07/22/2017  . Hyperkalemia 07/22/2017  . Symptomatic anemia 07/21/2017  . Glaucoma 07/21/2017  . UTI (lower urinary tract infection) 06/19/2016  . Sepsis ([place]) 06/19/2016  . Hyperglycemia 06/19/2016  . Hypertension 06/19/2016  . AKI (acute kidney injury) ([place]) 06/19/2016  . CKD (chronic kidney disease), stage V ([place]) 06/19/2016  . End stage renal disease ([place]) 03/22/2012    Past Surgical History:  Procedure Laterality Date  . ARTERIOVENOUS GRAFT PLACEMENT     left arm AVG  . AV FISTULA PLACEMENT  03/30/2012   Procedure: ARTERIOVENOUS (AV) FISTULA CREATION;  Surgeon: Angelia Mould, MD;  Location: [place];  Service: Vascular;  Laterality: Left;  . CYSTECTOMY     w/ ileal diversion for  bladder cancer   . TONSILLECTOMY         Home Medications    Prior to Admission medications   Medication Sig Start Date End Date Taking? Authorizing Provider  calcium carbonate (TUMS EX) 750 MG chewable tablet Chew 3 tablets by mouth 2 (two) times daily. LUNCH and SUPPER   Yes [provider]  dorzolamide (TRUSOPT) 2 % ophthalmic solution Place 1 drop into the right eye 2 (two) times daily.    Yes [provider]  ferrous sulfate (IRON SUPPLEMENT) 325 (65 FE) MG tablet Take 325 mg by mouth 2 (two) times daily with a meal. LUNCH and SUPPER   Yes [provider]  furosemide (LASIX) 80 MG tablet Take 80 mg by mouth daily.    Yes [provider]  latanoprost (XALATAN) 0.005 % ophthalmic solution Place 1 drop into both eyes daily. 5 PM   Yes [provider]  metoprolol tartrate (LOPRESSOR) 100 MG tablet Take 100 mg by mouth 2 (two) times daily. LUNCH and BEDTIME   Yes [provider]  sodium bicarbonate 650 MG tablet Take 650 mg by mouth 2 (two) times daily.    Yes [provider]  pantoprazole (PROTONIX) 40 MG tablet Take 1 tablet (40 mg total) by mouth daily. Patient not taking: Reported on 08/21/2017 08/14/17   Isaac Bliss, Rayford Halsted, MD    Family History Family History  Problem Relation Age of Onset  . Cancer Mother  BRAIN    Social History Social History  Substance Use Topics  . Smoking status: Former Smoker    Packs/day: 2.00    Years: 40.00    Types: Cigarettes, Pipe    Quit date: 03/22/2006  . Smokeless tobacco: Never Used     Comment: Stopped smoking 10 years ago.  . Alcohol use 19.2 oz/week    7 Cans of beer, 25 Glasses of wine per week     Allergies   Patient has no known allergies.   Review of Systems Review of Systems  Constitutional: Positive for fatigue. Negative for chills and fever.  HENT: Negative for congestion.   Eyes: Negative for visual disturbance.  Respiratory: Negative for  shortness of breath.   Cardiovascular: Positive for chest pain.  Gastrointestinal: Negative for abdominal pain and vomiting.  Genitourinary: Negative for dysuria and flank pain.  Musculoskeletal: Negative for back pain, neck pain and neck stiffness.  Skin: Negative for rash.  Neurological: Negative for light-headedness and headaches.     Physical Exam Updated Vital Signs BP (!) 116/53   Pulse 100   Temp 97.7 F (36.5 C) (Oral)   Resp 16   Ht 5\' 10"  (1.778 m)   Wt 74.8 kg (165 lb)   SpO2 99%   BMI 23.68 kg/m   Physical Exam  Constitutional: He is oriented to person, place, and time. He appears well-developed and well-nourished.  HENT:  Head: Normocephalic and atraumatic.  Eyes: Conjunctivae are normal. Right eye exhibits no discharge. Left eye exhibits no discharge.  Neck: Normal range of motion. Neck supple. No tracheal deviation present.  Cardiovascular: Regular rhythm.  Tachycardia present.   Pulmonary/Chest: Effort normal and breath sounds normal.  Abdominal: Soft. He exhibits no distension. There is no tenderness. There is no guarding.  Musculoskeletal: He exhibits no edema.  Neurological: He is alert and oriented to person, place, and time.  Skin: Skin is warm. No rash noted. There is pallor.  Psychiatric: He has a normal mood and affect.  Nursing note and vitals reviewed.    ED Treatments / Results  Labs (all labs ordered are listed, but only abnormal results are displayed) Labs Reviewed  BASIC METABOLIC PANEL - Abnormal; Notable for the following:       Result Value   Sodium 134 (*)    Chloride 99 (*)    CO2 19 (*)    Glucose, Bld 209 (*)    BUN 107 (*)    Creatinine, Ser 7.50 (*)    GFR calc non Af Amer 6 (*)    GFR calc Af Amer 7 (*)    Anion gap 16 (*)    All other components within normal limits  CBC - Abnormal; Notable for the following:    RBC 2.31 (*)    Hemoglobin 7.3 (*)    HCT 22.2 (*)    RDW 16.4 (*)    All other components within normal  limits  I-STAT TROPONIN, ED  TYPE AND SCREEN  PREPARE RBC (CROSSMATCH)    EKG  EKG Interpretation None       Radiology Dg Chest 2 View  Result Date: 08/21/2017 CLINICAL DATA:  Chest pain EXAM: CHEST  2 VIEW COMPARISON:  08/12/2017 FINDINGS: Hyper inflation. No large pleural effusion. Mild hazy bibasilar atelectasis or small infiltrates. Chronic bronchitic changes. Stable cardiomediastinal silhouette with atherosclerosis. IMPRESSION: Hyperinflation with mild hazy bibasilar atelectasis versus small infiltrates. Electronically Signed   By: Donavan Foil M.D.   On: 08/21/2017 21:34  Procedures .Critical Care Performed by: Elnora Morrison Authorized by: Elnora Morrison   Critical care provider statement:    Critical care time (minutes):  35   Critical care start time:  08/21/2017 9:30 PM   Critical care end time:  08/21/2017 10:05 PM   Critical care time was exclusive of:  Separately billable procedures and treating other patients and teaching time   Critical care was necessary to treat or prevent imminent or life-threatening deterioration of the following conditions:  Metabolic crisis   Critical care was time spent personally by me on the following activities:  Examination of patient, ordering and review of laboratory studies and ordering and review of radiographic studies   I assumed direction of critical care for this patient from another provider in my specialty: no      (including critical care time)  Medications Ordered in ED Medications - No data to display   Initial Impression / Assessment and Plan / ED Course  I have reviewed the triage vital signs and the nursing notes.  Pertinent labs & imaging results that were available during my care of the patient were reviewed by me and considered in my medical decision making (see chart for details).    Patient presents with symptomatic anemia, chest pain and worsening ST depressions on EKG.  Patient does not want a  provocative testing.  Likely demand secondary to anemia.  Discussed risks and benefits of blood transfusion patient agrees.  The patient has had this in the past.  Blood transfusion ordered.  Patient admitted to hospitalist.   Hemocult pos, no gross blood.  The patients results and plan were reviewed and discussed.   Any x-rays performed were independently reviewed by myself.   Differential diagnosis were considered with the presenting HPI.  Medications  ferrous sulfate tablet 325 mg (325 mg Oral Given 08/22/17 1700)  calcium carbonate (TUMS - dosed in mg elemental calcium) chewable tablet 800 mg of elemental calcium (800 mg of elemental calcium Oral Given 08/22/17 1700)  dorzolamide (TRUSOPT) 2 % ophthalmic solution 1 drop (1 drop Right Eye Given 08/22/17 2226)  furosemide (LASIX) tablet 80 mg (80 mg Oral Given 08/22/17 0943)  metoprolol tartrate (LOPRESSOR) tablet 100 mg (100 mg Oral Given 08/22/17 2226)  sodium bicarbonate tablet 650 mg (650 mg Oral Given 08/22/17 2225)  insulin aspart (novoLOG) injection 0-9 Units (1 Units Subcutaneous Given 08/22/17 1700)  acetaminophen (TYLENOL) tablet 650 mg (not administered)    Or  acetaminophen (TYLENOL) suppository 650 mg (not administered)  isosorbide mononitrate (IMDUR) 24 hr tablet 60 mg (60 mg Oral Given 08/22/17 1700)    Vitals:   08/22/17 0759 08/22/17 1326 08/22/17 1946 08/23/17 0657  BP: (!) 157/56 (!) 141/48 (!) 147/60 (!) 140/54  Pulse: 80 87 99 87  Resp:  18 18 18   Temp: 98.2 F (36.8 C) 97.7 F (36.5 C) 97.7 F (36.5 C) 97.7 F (36.5 C)  TempSrc: Oral Oral Oral Oral  SpO2: 99% 100% 100% 100%  Weight:    74.9 kg (165 lb 3.2 oz)  Height:        Final diagnoses:  Symptomatic anemia  Acute chest pain  Abnormal EKG    Admission/ observation were discussed with the admitting physician, patient and/or family and they are comfortable with the plan.    Final Clinical Impressions(s) / ED Diagnoses   Final diagnoses:    None    New Prescriptions New Prescriptions   No medications on file     Elnora Morrison,  MD 08/23/17 7654

## 2017-08-22 DIAGNOSIS — Z66 Do not resuscitate: Secondary | ICD-10-CM | POA: Diagnosis present

## 2017-08-22 DIAGNOSIS — I200 Unstable angina: Secondary | ICD-10-CM | POA: Diagnosis not present

## 2017-08-22 DIAGNOSIS — Z87891 Personal history of nicotine dependence: Secondary | ICD-10-CM | POA: Diagnosis not present

## 2017-08-22 DIAGNOSIS — R079 Chest pain, unspecified: Secondary | ICD-10-CM | POA: Diagnosis not present

## 2017-08-22 DIAGNOSIS — Z7189 Other specified counseling: Secondary | ICD-10-CM

## 2017-08-22 DIAGNOSIS — Z906 Acquired absence of other parts of urinary tract: Secondary | ICD-10-CM | POA: Diagnosis not present

## 2017-08-22 DIAGNOSIS — Z515 Encounter for palliative care: Secondary | ICD-10-CM

## 2017-08-22 DIAGNOSIS — I4510 Unspecified right bundle-branch block: Secondary | ICD-10-CM | POA: Diagnosis present

## 2017-08-22 DIAGNOSIS — N185 Chronic kidney disease, stage 5: Secondary | ICD-10-CM

## 2017-08-22 DIAGNOSIS — N2581 Secondary hyperparathyroidism of renal origin: Secondary | ICD-10-CM | POA: Diagnosis present

## 2017-08-22 DIAGNOSIS — R9431 Abnormal electrocardiogram [ECG] [EKG]: Secondary | ICD-10-CM | POA: Diagnosis not present

## 2017-08-22 DIAGNOSIS — Z79899 Other long term (current) drug therapy: Secondary | ICD-10-CM | POA: Diagnosis not present

## 2017-08-22 DIAGNOSIS — I120 Hypertensive chronic kidney disease with stage 5 chronic kidney disease or end stage renal disease: Secondary | ICD-10-CM | POA: Diagnosis present

## 2017-08-22 DIAGNOSIS — D638 Anemia in other chronic diseases classified elsewhere: Secondary | ICD-10-CM | POA: Diagnosis present

## 2017-08-22 DIAGNOSIS — I083 Combined rheumatic disorders of mitral, aortic and tricuspid valves: Secondary | ICD-10-CM | POA: Diagnosis present

## 2017-08-22 DIAGNOSIS — Z8551 Personal history of malignant neoplasm of bladder: Secondary | ICD-10-CM | POA: Diagnosis not present

## 2017-08-22 DIAGNOSIS — N186 End stage renal disease: Secondary | ICD-10-CM | POA: Diagnosis not present

## 2017-08-22 DIAGNOSIS — R197 Diarrhea, unspecified: Secondary | ICD-10-CM | POA: Diagnosis present

## 2017-08-22 DIAGNOSIS — D649 Anemia, unspecified: Secondary | ICD-10-CM | POA: Diagnosis not present

## 2017-08-22 DIAGNOSIS — F419 Anxiety disorder, unspecified: Secondary | ICD-10-CM | POA: Diagnosis present

## 2017-08-22 DIAGNOSIS — Z95828 Presence of other vascular implants and grafts: Secondary | ICD-10-CM | POA: Diagnosis not present

## 2017-08-22 DIAGNOSIS — E1165 Type 2 diabetes mellitus with hyperglycemia: Secondary | ICD-10-CM | POA: Diagnosis present

## 2017-08-22 DIAGNOSIS — I214 Non-ST elevation (NSTEMI) myocardial infarction: Secondary | ICD-10-CM | POA: Diagnosis not present

## 2017-08-22 DIAGNOSIS — I089 Rheumatic multiple valve disease, unspecified: Secondary | ICD-10-CM | POA: Diagnosis present

## 2017-08-22 DIAGNOSIS — E1122 Type 2 diabetes mellitus with diabetic chronic kidney disease: Secondary | ICD-10-CM | POA: Diagnosis present

## 2017-08-22 LAB — COMPREHENSIVE METABOLIC PANEL
ALBUMIN: 2.9 g/dL — AB (ref 3.5–5.0)
ALT: 11 U/L — AB (ref 17–63)
AST: 15 U/L (ref 15–41)
Alkaline Phosphatase: 59 U/L (ref 38–126)
Anion gap: 15 (ref 5–15)
BILIRUBIN TOTAL: 0.9 mg/dL (ref 0.3–1.2)
BUN: 107 mg/dL — ABNORMAL HIGH (ref 6–20)
CALCIUM: 8.8 mg/dL — AB (ref 8.9–10.3)
CO2: 19 mmol/L — AB (ref 22–32)
Chloride: 102 mmol/L (ref 101–111)
Creatinine, Ser: 7.54 mg/dL — ABNORMAL HIGH (ref 0.61–1.24)
GFR calc Af Amer: 7 mL/min — ABNORMAL LOW (ref >60)
GFR, EST NON AFRICAN AMERICAN: 6 mL/min — AB (ref >60)
Glucose, Bld: 87 mg/dL (ref 65–99)
POTASSIUM: 4.5 mmol/L (ref 3.5–5.1)
Sodium: 136 mmol/L (ref 135–145)
Total Protein: 6.4 g/dL — ABNORMAL LOW (ref 6.5–8.1)

## 2017-08-22 LAB — GASTROINTESTINAL PANEL BY PCR, STOOL (REPLACES STOOL CULTURE)

## 2017-08-22 LAB — CBC
HEMATOCRIT: 24.9 % — AB (ref 39.0–52.0)
HEMOGLOBIN: 8.3 g/dL — AB (ref 13.0–17.0)
MCH: 31.4 pg (ref 26.0–34.0)
MCHC: 33.3 g/dL (ref 30.0–36.0)
MCV: 94.3 fL (ref 78.0–100.0)
Platelets: 156 10*3/uL (ref 150–400)
RBC: 2.64 MIL/uL — AB (ref 4.22–5.81)
RDW: 17.1 % — ABNORMAL HIGH (ref 11.5–15.5)
WBC: 7.8 10*3/uL (ref 4.0–10.5)

## 2017-08-22 LAB — TROPONIN I
TROPONIN I: 0.38 ng/mL — AB (ref <0.03)
TROPONIN I: 0.93 ng/mL — AB (ref <0.03)
Troponin I: 1.04 ng/mL (ref <0.03)

## 2017-08-22 LAB — GLUCOSE, CAPILLARY
GLUCOSE-CAPILLARY: 95 mg/dL (ref 65–99)
Glucose-Capillary: 101 mg/dL — ABNORMAL HIGH (ref 65–99)
Glucose-Capillary: 122 mg/dL — ABNORMAL HIGH (ref 65–99)
Glucose-Capillary: 97 mg/dL (ref 65–99)

## 2017-08-22 LAB — C DIFFICILE QUICK SCREEN W PCR REFLEX
C DIFFICILE (CDIFF) TOXIN: NEGATIVE
C DIFFICLE (CDIFF) ANTIGEN: NEGATIVE
C Diff interpretation: NOT DETECTED

## 2017-08-22 LAB — HEMOGLOBIN A1C
Hgb A1c MFr Bld: 5.1 % (ref 4.8–5.6)
MEAN PLASMA GLUCOSE: 99.67 mg/dL

## 2017-08-22 MED ORDER — ISOSORBIDE MONONITRATE ER 60 MG PO TB24
60.0000 mg | ORAL_TABLET | Freq: Every day | ORAL | Status: DC
  Administered 2017-08-22 – 2017-08-25 (×4): 60 mg via ORAL
  Filled 2017-08-22 (×4): qty 1

## 2017-08-22 NOTE — Progress Notes (Signed)
Palliative:  Full note to follow. Noted RN note and will plan to reach out to daughter tomorrow with patient's permission. Per discussion today daughter is HCPOA. Firm in decision for NO dialysis, NO cardiac catheterization or aggressive procedures. After discussion he did decide DNR. We did discuss hospice and he may not be ready for that now but is open to consideration in the future and open to further discussion. Thank you for this consult.   No charge  Vinie Sill, NP Palliative Medicine Team Pager # [phone] (M-F 8a-5p) Team Phone # [phone] (Nights/Weekends)

## 2017-08-22 NOTE — Progress Notes (Signed)
Pt is alert and oriented denies pain, from home alone, Receiving 1 unit of blood, hgb 7.3, hemoccult positive x2, pending 1 more sample. Transfusion started at [place].

## 2017-08-22 NOTE — Progress Notes (Signed)
PROGRESS NOTE    Matthew Mays  IRS:[id number] DOB: Oct 26, 1940 DOA: 08/21/2017 PCP: Estanislado Emms, MD   Outpatient Specialists:    Brief Narrative:  Matthew Mays  is a 76 y.o. 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     Pt states that the chest pain is all across his upper chest and began tonight while he was sitting in his lazy boy.  Went from the left side to the right side of his chest,  "dull",, no radiation.  Pt denies fever, chills, cough, palp, sob, n/v, heartburn.  Pt states the pain lasted for about 45 minutes,  Nothing appeared to make the pain better or worse. Typically taking 4 baby aspirin causes the pain to go away and when it didn't he decided to call EMS.     Assessment & Plan:   Principal Problem:   Chest pain Active Problems:   End stage renal disease (HCC)   Hyperglycemia   CKD (chronic kidney disease), stage V (HCC)   Anemia   Chest pain Prior Echo 08/13/2017 Doubt would be a candidate for ST as not willing to have cath and risk the need for HD   Anemia of chronic disease Transfusion ordered by ED -not interested in  GI eval (has seen [facility] in the past) -- colonoscopy/EGD -says procrit not working-- defer to Dr. Florene Glen  will get palliative care evaluation for [place]  ESRD  Not interested in HD  Valvular heart disease (mild AS, mod AR, mild MR) Stable  Dm2 fsbs q4h, ISS  Diarrhea Intermittent- long standing   DVT prophylaxis:  SCD's  Code Status: Full Code   Family Communication:   Disposition Plan:     Consultants:   cards   Subjective: Not interested in HD Says procrit does not work -has on and off diarrhea every 3 days or so  Objective: Vitals:   08/22/17 0414 08/22/17 0507 08/22/17 0658 08/22/17 0759  BP: (!) 152/58 (!) 167/57  (!) 157/56  Pulse: 86 89  80  Resp: 20     Temp: 97.8 F (36.6 C)   98.2 F (36.8 C)    TempSrc: Oral   Oral  SpO2: 100%   99%  Weight:   75.5 kg (166 lb 8 oz)   Height:        Intake/Output Summary (Last 24 hours) at 08/22/17 1230 Last data filed at 08/22/17 1144  Gross per 24 hour  Intake              640 ml  Output              676 ml  Net              -36 ml   Filed Weights   08/21/17 2005 08/21/17 2332 08/22/17 0658  Weight: 74.8 kg (165 lb) 74.6 kg (164 lb 8 oz) 75.5 kg (166 lb 8 oz)    Examination:  General exam: chronically ill appearing Respiratory system: clear Cardiovascular system: rrr Gastrointestinal system: +Bs, soft Central nervous system: Alert and oriented. No focal neurological deficits. Psychiatry: Judgement and insight appear normal. Mood & affect appropriate.     Data Reviewed: I have personally reviewed following labs and imaging studies  CBC:  Recent Labs Lab 08/21/17 2030 08/22/17 0531  WBC 8.9 7.8  HGB 7.3* 8.3*  HCT 22.2* 24.9*  MCV 96.1 94.3  PLT 163 630   Basic Metabolic Panel:  Recent Labs Lab 08/21/17 2030 08/22/17 0531  NA 134* 136  K 4.3 4.5  CL 99* 102  CO2 19* 19*  GLUCOSE 209* 87  BUN 107* 107*  CREATININE 7.50* 7.54*  CALCIUM 9.1 8.8*   GFR: Estimated Creatinine Clearance: 8.6 mL/min (A) (by C-G formula based on SCr of 7.54 mg/dL (H)). Liver Function Tests:  Recent Labs Lab 08/22/17 0531  AST 15  ALT 11*  ALKPHOS 59  BILITOT 0.9  PROT 6.4*  ALBUMIN 2.9*   No results for input(s): LIPASE, AMYLASE in the last 168 hours. No results for input(s): AMMONIA in the last 168 hours. Coagulation Profile: No results for input(s): INR, PROTIME in the last 168 hours. Cardiac Enzymes:  Recent Labs Lab 08/22/17 0002 08/22/17 0531  TROPONINI 0.38* 0.93*   BNP (last 3 results) No results for input(s): PROBNP in the last 8760 hours. HbA1C:  Recent Labs  08/22/17 0002  HGBA1C 5.1   CBG:  Recent Labs Lab 08/22/17 0745 08/22/17 1110  GLUCAP 101* 95   Lipid Profile: No results for  input(s): CHOL, HDL, LDLCALC, TRIG, CHOLHDL, LDLDIRECT in the last 72 hours. Thyroid Function Tests: No results for input(s): TSH, T4TOTAL, FREET4, T3FREE, THYROIDAB in the last 72 hours. Anemia Panel: No results for input(s): VITAMINB12, FOLATE, FERRITIN, TIBC, IRON, RETICCTPCT in the last 72 hours. Urine analysis:    Component Value Date/Time   COLORURINE YELLOW 06/19/2016 0936   APPEARANCEUR HAZY (A) 06/19/2016 0936   LABSPEC 1.014 06/19/2016 0936   PHURINE 6.5 06/19/2016 0936   GLUCOSEU NEGATIVE 06/19/2016 0936   HGBUR MODERATE (A) 06/19/2016 0936   BILIRUBINUR NEGATIVE 06/19/2016 0936   KETONESUR NEGATIVE 06/19/2016 0936   PROTEINUR 100 (A) 06/19/2016 0936   NITRITE NEGATIVE 06/19/2016 0936   LEUKOCYTESUR MODERATE (A) 06/19/2016 0936    ) Recent Results (from the past 240 hour(s))  C difficile quick scan w PCR reflex     Status: None   Collection Time: 08/22/17 10:34 AM  Result Value Ref Range Status   C Diff antigen NEGATIVE NEGATIVE Final   C Diff toxin NEGATIVE NEGATIVE Final   C Diff interpretation No C. difficile detected.  Final      Anti-infectives    None       Radiology Studies: Dg Chest 2 View  Result Date: 08/21/2017 CLINICAL DATA:  Chest pain EXAM: CHEST  2 VIEW COMPARISON:  08/12/2017 FINDINGS: Hyper inflation. No large pleural effusion. Mild hazy bibasilar atelectasis or small infiltrates. Chronic bronchitic changes. Stable cardiomediastinal silhouette with atherosclerosis. IMPRESSION: Hyperinflation with mild hazy bibasilar atelectasis versus small infiltrates. Electronically Signed   By: Donavan Foil M.D.   On: 08/21/2017 21:34        Scheduled Meds: . calcium carbonate  4 tablet Oral BID AC  . dorzolamide  1 drop Right Eye BID  . ferrous sulfate  325 mg Oral BID WC  . furosemide  80 mg Oral Daily  . insulin aspart  0-9 Units Subcutaneous TID WC  . metoprolol tartrate  100 mg Oral BID  . sodium bicarbonate  650 mg Oral BID   Continuous  Infusions:   LOS: 0 days    Time spent: 35 min    [place], DO [facility] Pager [phone]  If 7PM-7AM, please contact night-coverage [email or web address] Password [email or web address] 08/22/2017, 12:30 PM

## 2017-08-22 NOTE — Progress Notes (Signed)
Spoke with daughter of patient on phone and daughter desires to speak with MD.   Note left on patient chart for MD to return call to daughter. Daughter, Sara Chu states she is out of the state and will be gone for about 3 months.

## 2017-08-22 NOTE — Consult Note (Signed)
Consultation Note Date: 08/22/2017   Patient Name: Matthew Mays  DOB: 1941/05/12  MRN: [id number]  Age / Sex: 76 y.o., male  PCP: Estanislado Emms, MD Referring Physician: Geradine Girt, DO  Reason for Consultation: Establishing goals of care  HPI/Patient Profile: 76 y.o. male  with past medical history of HTN, HLD, DM2, mild aortic stenosis, moderate aortic regurgitation, mild mitral regurgitation, ESRD (not on HD), and anemia admitted on 08/21/2017 with chest pain. Positive troponin and EKG changes. High risk for ACS. Patient currently refusing cardiac cath d/t risk for HD. Has refused HD for years. Of note, has also refused GI evaluation for chronic diarrhea and recurrent anemia.   Clinical Assessment and Goals of Care: This NP and Vinie Sill, NP met with patient at bedside. We introduced concept of palliative care. We discussed patient's disease progression including recurrent chest pain, ESRD, chronic anemia, and recurrent diarrhea. Patient had decent understanding of illnesses and misconceptions were clarified. Discussed treatment options including cardiac catheterization and hemodialysis. Patient remains firm in his decisions that he would not want these interventions at any point. He does not want catheterization specifically d/t risk of hemodialysis. Pt speaks of how he has lived with ESRD for 8 years and is comfortable in his decision to not receive dialysis and understands that this may lead to the end of his life. He also understands the risks of not undergoing cardiac catheterization. Patient is clear that quality of life is far more important to him than quantity and he fears these interventions would compromise his quality of life. We discussed code status and described the differences between full code and DNR. After thorough explanation of each, patient elected DNR.  Patient's main concern throughout conversation was the assistance he  will need at home. He tells [place] he is able to complete ADLs independently with the equipment he has at home - toilet chair and walker. However, he is unable to ambulate for very long or carry items from one room to another. He also understands that his medical diagnoses will cause him to become weaker as time progresses and wants a plan in place for what to do when that happens. He shares with [place] he has contacts that can provide assistance with some ADLs, but wants to explore other options. We briefly discussed home health options and then introduced Hospice as an option. He shared how this was a scary word for him, but he is not opposed to Hospice. However, at this time, he does not feel ready for their services. He would like contact information to contact them once he is home if his needs increased support and is open to further discussion about hospice.   He tells [place] he has 2 children - a son in [place] and a daughter who lives in [place] but is currently in [place]. He shares that his relationship with each of them is important to him. 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   Primary Decision Maker PATIENT - daughter has HCPOA if pt unable to make decisions    SUMMARY OF RECOMMENDATIONS   Change code status to DNR Will follow up daily to assist with decisions and further discuss home Hospice option as patient wishes May be appropriate for palliative follow up as outpatient  Code Status/Advance Care Planning:  DNR   Symptom Management:   none  Palliative Prophylaxis:   Frequent Pain Assessment and Oral Care  Additional Recommendations (  Limitations, Scope, Preferences):  No Hemodialysis  Psycho-social/Spiritual:   Desire for further Chaplaincy support:no  Additional Recommendations: Education on Hospice  Prognosis:   Unable to determine  Discharge Planning: To Be Determined      Primary Diagnoses: Present on  Admission: . Chest pain . Anemia . CKD (chronic kidney disease), stage V ([place]) . Hyperglycemia . End stage renal disease ([place])   I have reviewed the medical record, interviewed the patient and family, and examined the patient. The following aspects are pertinent.  Past Medical History:  Diagnosis Date  . Anemia   . Aortic regurgitation 08/13/2017   moderate  . Aortic stenosis 08/13/2017   mild  . Bladder cancer ([place])   . Chronic kidney disease   . H/O sinus tachycardia   . Hypertension   . Mitral regurgitation 08/13/2017   mild  . Prediabetes   . Secondary hyperparathyroidism [facility])    Social History   Social History  . Marital status: Married    Spouse name: N/A  . Number of children: N/A  . Years of education: N/A   Social History Main Topics  . Smoking status: Former Smoker    Packs/day: 2.00    Years: 40.00    Types: Cigarettes, Pipe    Quit date: 03/22/2006  . Smokeless tobacco: Never Used     Comment: Stopped smoking 10 years ago.  . Alcohol use 19.2 oz/week    7 Cans of beer, 25 Glasses of wine per week  . Drug use: No  . Sexual activity: Not Asked   Other Topics Concern  . None   Social History Narrative  . None   Family History  Problem Relation Age of Onset  . Cancer Mother        BRAIN   Scheduled Meds: . calcium carbonate  4 tablet Oral BID AC  . dorzolamide  1 drop Right Eye BID  . ferrous sulfate  325 mg Oral BID WC  . furosemide  80 mg Oral Daily  . insulin aspart  0-9 Units Subcutaneous TID WC  . metoprolol tartrate  100 mg Oral BID  . sodium bicarbonate  650 mg Oral BID   Continuous Infusions: PRN Meds:.acetaminophen **OR** acetaminophen No Known Allergies Review of Systems  Constitutional: Positive for fatigue. Negative for activity change, appetite change and unexpected weight change.  Respiratory: Negative for cough and shortness of breath.   Cardiovascular: Negative for chest pain and palpitations.  Gastrointestinal:  Positive for diarrhea. Negative for abdominal pain.  Neurological: Positive for weakness. Negative for syncope.  Psychiatric/Behavioral: Negative for sleep disturbance. The patient is not nervous/anxious.     Physical Exam  Constitutional: He is oriented to person, place, and time. He appears well-developed and well-nourished. He is cooperative. No distress.  Pulmonary/Chest: Effort normal. No respiratory distress.  Neurological: He is alert and oriented to person, place, and time.  Skin: Skin is warm and dry. He is not diaphoretic.  Psychiatric: He has a normal mood and affect. His speech is normal and behavior is normal. Judgment and thought content normal. Cognition and memory are normal.    Vital Signs: BP (!) 141/48 (BP Location: Right Arm)   Pulse 87   Temp 97.7 F (36.5 C) (Oral)   Resp 18   Ht '5\' 10"'$  (1.778 m)   Wt 75.5 kg (166 lb 8 oz)   SpO2 100%   BMI 23.89 kg/m  Pain Assessment: No/denies pain   Pain Score: 0-No pain   SpO2: SpO2: 100 %  O2 Device:SpO2: 100 % O2 Flow Rate: .   IO: Intake/output summary:  Intake/Output Summary (Last 24 hours) at 08/22/17 1535 Last data filed at 08/22/17 1411  Gross per 24 hour  Intake              640 ml  Output              677 ml  Net              -37 ml    LBM:   Baseline Weight: Weight: 74.8 kg (165 lb) Most recent weight: Weight: 75.5 kg (166 lb 8 oz)     Palliative Assessment/Data: 60%     Time In: 15:15 Time Out: 16:30 Time Total: 75 minutes Greater than 50%  of this time was spent counseling and coordinating care related to the above assessment and plan.  Juel Burrow, DNP, AGNP-C Palliative Medicine Team [phone]

## 2017-08-22 NOTE — Plan of Care (Signed)
Problem: Nutrition: Goal: Adequate nutrition will be maintained Outcome: Progressing NPO for poss test.

## 2017-08-22 NOTE — Consult Note (Signed)
Cardiology Consultation:   Patient ID: Matthew Mays; [id number]; [birth year]   Admit date: 08/21/2017 Date of Consult: 08/22/2017  Primary Care Provider: Estanislado Emms, MD Primary Cardiologist: new - Dr. Percival Spanish Primary Electrophysiologist:     Patient Profile:   Matthew Mays is a 76 y.o. 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 Eliseo Squires.  History of Present Illness:   Matthew Mays is not currently followed by cardiology. He was recently hospitalized twice (07/21/17 - 07/23/17; 08/12/17 - 08/13/17) with complaints of chest pain. Both times, he was found to be anemic and received blood products. He states that after his last discharge, he has had intermittent resting chest pain (3-4 times) that was resolved by taking 4 baby aspirin. He states that last evening, he had the chest pain but it was not resolved by the 4 baby aspirin so he called EMS.  On arrival to [facility], he had chest pain across his upper chest. The pain did not radiate and was described as a 5/10 pain. He states that he knows its not a heart attack because he's not having pain where his heart is (while pointing to abdomen). He states his chest pain is anxiety. The chest pain was not associated with SOB, palpitations, diaphoresis, feelings of syncope, or N/V. He states he has had diarrhea for 12 years that is worsened with ABX. Chest pain reported during his recent hospitalizations was described as occurring with activity such as light house chores. He stated at that time that drinking a glass of wine generally relieved the pain. It was noted that he drinks 4 glasses of wine daily.  Troponin was found to be elevated and he had ST changes in the inferior and anterolateral leads. During his last two  hospitalizations, EKGs with nonspecific ST changes and RBBB. The current EKG changes appear to be new. Troponin was mildly elevated during his last two hospitalizations.  Past Medical History:  Diagnosis Date  . Anemia   . Aortic regurgitation 08/13/2017   moderate  . Aortic stenosis 08/13/2017   mild  . Bladder cancer ([place])   . Chronic kidney disease   . H/O sinus tachycardia   . Hypertension   . Mitral regurgitation 08/13/2017   mild  . Prediabetes   . Secondary hyperparathyroidism [facility])     Past Surgical History:  Procedure Laterality Date  . ARTERIOVENOUS GRAFT PLACEMENT     left arm AVG  . AV FISTULA PLACEMENT  03/30/2012   Procedure: ARTERIOVENOUS (AV) FISTULA CREATION;  Surgeon: Angelia Mould, MD;  Location: [facility];  Service: Vascular;  Laterality: Left;  . CYSTECTOMY     w/ ileal diversion for bladder cancer   . TONSILLECTOMY       Home Medications:  Prior to Admission medications   Medication Sig Start Date End Date Taking? Authorizing Provider  calcium carbonate (TUMS EX) 750 MG chewable tablet Chew 3 tablets by mouth 2 (two) times daily. LUNCH and SUPPER   Yes [provider]  dorzolamide (TRUSOPT) 2 % ophthalmic solution Place 1 drop into the right eye 2 (two) times daily.    Yes [provider]  ferrous sulfate (IRON SUPPLEMENT) 325 (65 FE) MG tablet Take 325 mg by mouth 2 (two) times daily with a meal.  LUNCH and SUPPER   Yes [provider]  furosemide (LASIX) 80 MG tablet Take 80 mg by mouth daily.    Yes [provider]  latanoprost (XALATAN) 0.005 % ophthalmic solution Place 1 drop into both eyes daily. 5 PM   Yes [provider]  metoprolol tartrate (LOPRESSOR) 100 MG tablet Take 100 mg by mouth 2 (two) times daily. LUNCH and BEDTIME   Yes [provider]  sodium bicarbonate 650 MG tablet Take 650 mg by mouth 2 (two) times daily.    Yes [provider]  pantoprazole (PROTONIX) 40 MG tablet  Take 1 tablet (40 mg total) by mouth daily. Patient not taking: Reported on 08/21/2017 08/14/17   Isaac Bliss, Rayford Halsted, MD    Inpatient Medications: Scheduled Meds: . calcium carbonate  4 tablet Oral BID AC  . dorzolamide  1 drop Right Eye BID  . ferrous sulfate  325 mg Oral BID WC  . furosemide  80 mg Oral Daily  . insulin aspart  0-9 Units Subcutaneous TID WC  . metoprolol tartrate  100 mg Oral BID  . sodium bicarbonate  650 mg Oral BID   Continuous Infusions:  PRN Meds: acetaminophen **OR** acetaminophen  Allergies:   No Known Allergies  Social History:   Social History   Social History  . Marital status: Married    Spouse name: N/A  . Number of children: N/A  . Years of education: N/A   Occupational History  . Not on file.   Social History Main Topics  . Smoking status: Former Smoker    Packs/day: 2.00    Years: 40.00    Types: Cigarettes, Pipe    Quit date: 03/22/2006  . Smokeless tobacco: Never Used     Comment: Stopped smoking 10 years ago.  . Alcohol use 19.2 oz/week    7 Cans of beer, 25 Glasses of wine per week  . Drug use: No  . Sexual activity: Not on file   Other Topics Concern  . Not on file   Social History Narrative  . No narrative on file    Family History:    Family History  Problem Relation Age of Onset  . Cancer Mother        BRAIN     ROS:  Please see the history of present illness.  ROS  All other ROS reviewed and negative.     Physical Exam/Data:   Vitals:   08/22/17 0414 08/22/17 0507 08/22/17 0658 08/22/17 0759  BP: (!) 152/58 (!) 167/57  (!) 157/56  Pulse: 86 89  80  Resp: 20     Temp: 97.8 F (36.6 C)   98.2 F (36.8 C)  TempSrc: Oral   Oral  SpO2: 100%   99%  Weight:   166 lb 8 oz (75.5 kg)   Height:        Intake/Output Summary (Last 24 hours) at 08/22/17 1252 Last data filed at 08/22/17 1144  Gross per 24 hour  Intake              640 ml  Output              676 ml  Net              -36 ml    Filed Weights   08/21/17 2005 08/21/17 2332 08/22/17 0658  Weight: 165 lb (74.8 kg) 164 lb 8 oz (74.6 kg) 166 lb 8 oz (75.5 kg)   Body mass index  is 23.89 kg/m.  General:  Well nourished, well developed, in no acute distress HEENT: normal Neck: no JVD Vascular: No carotid bruits, exam difficult Cardiac:  normal S1, S2; RRR; no murmur  Lungs:  clear to auscultation bilaterally, no wheezing, rhonchi or rales Abd: soft, mildly tender, no hepatomegaly  Ext: no edema Musculoskeletal:  No deformities, BUE and BLE strength normal and equal Skin: warm and dry  Neuro:  CNs 2-12 intact, no focal abnormalities noted Psych:  Normal affect   EKG:  The EKG was personally reviewed and demonstrates:  ST depression in inferior and anterolateral leads Telemetry:  Telemetry was personally reviewed and demonstrates:  sinus  Relevant CV Studies:  Echocardiogram 08/13/17: Study Conclusions - Left ventricle: The cavity size was normal. There was mild   concentric hypertrophy. Systolic function was normal. The   estimated ejection fraction was in the range of 55% to 60%. Wall   motion was normal; there were no regional wall motion   abnormalities. Doppler parameters are consistent with abnormal   left ventricular relaxation (grade 1 diastolic dysfunction).   Doppler parameters are consistent with high ventricular filling   pressure. - Aortic valve: Valve mobility was restricted. There was mild   stenosis. There was moderate regurgitation. Valve area (VTI):   1.18 cm^2. Valve area (Vmax): 1.13 cm^2. Valve area (Vmean): 1.07   cm^2. - Mitral valve: Mildly calcified annulus. Transvalvular velocity   was within the normal range. There was no evidence for stenosis.   There was mild regurgitation. - Left atrium: The atrium was mildly dilated. - Right ventricle: The cavity size was normal. Wall thickness was   normal. Systolic function was normal. - Atrial septum: No defect or patent foramen ovale  was identified. - Tricuspid valve: There was trivial regurgitation.  Impressions: - By peak velocity and mean gradient, aortic stenosis is mild.   However, visually his valve is very restricted. I suspect he has   at least moderate aortic stenosis or paradoxical low flow, low   gradient aortic stenosis. Could consider a dobutamine stress echo   if clinically indicated.   Laboratory Data:  Chemistry Recent Labs Lab 08/21/17 2030 08/22/17 0531  NA 134* 136  K 4.3 4.5  CL 99* 102  CO2 19* 19*  GLUCOSE 209* 87  BUN 107* 107*  CREATININE 7.50* 7.54*  CALCIUM 9.1 8.8*  GFRNONAA 6* 6*  GFRAA 7* 7*  ANIONGAP 16* 15     Recent Labs Lab 08/22/17 0531  PROT 6.4*  ALBUMIN 2.9*  AST 15  ALT 11*  ALKPHOS 59  BILITOT 0.9   Hematology Recent Labs Lab 08/21/17 2030 08/22/17 0531  WBC 8.9 7.8  RBC 2.31* 2.64*  HGB 7.3* 8.3*  HCT 22.2* 24.9*  MCV 96.1 94.3  MCH 31.6 31.4  MCHC 32.9 33.3  RDW 16.4* 17.1*  PLT 163 156   Cardiac Enzymes Recent Labs Lab 08/22/17 0002 08/22/17 0531  TROPONINI 0.38* 0.93*    Recent Labs Lab 08/21/17 2042  TROPIPOC 0.04    BNPNo results for input(s): BNP, PROBNP in the last 168 hours.  DDimer No results for input(s): DDIMER in the last 168 hours.  Radiology/Studies:  Dg Chest 2 View  Result Date: 08/21/2017 CLINICAL DATA:  Chest pain EXAM: CHEST  2 VIEW COMPARISON:  08/12/2017 FINDINGS: Hyper inflation. No large pleural effusion. Mild hazy bibasilar atelectasis or small infiltrates. Chronic bronchitic changes. Stable cardiomediastinal silhouette with atherosclerosis. IMPRESSION: Hyperinflation with mild hazy bibasilar atelectasis versus small infiltrates. Electronically Signed  By: Donavan Foil M.D.   On: 08/21/2017 21:34    Assessment and Plan:   1. Chest pain, elevated troponin with EKG changes - troponin 0.38 --> 0.93 --> 1.04 - EKG with ST depression in inferior, and anterolateral leads - Pt has risk factors for ACS,  including HTN, pre-diabetes, and former tobacco use. TIMI score is 5; GRACE score is 151. Given his clinical picture, ACS is suspected. However, I discussed with him the possibility that he may need dialysis following a dye load with a heart catheterization and he declines heart cath. He states he has refused dialysis for years and will refuse any procedure that would increase his chances of dialysis. Additionally, he may not be a good candidate for DAPT given his recurrent anemia. - Optimize medical management. Question daily 81 mg ASA given his recurrent anemia. Will defer to primary team for ASA regimen. Continue lopressor. LDL is 62, HDL 55. Continue home lasix for grade 1 DD. Consider imdur as below. Avoid ACEI/ARB given his renal function.   2. HTN - home meds include 80 mg lasix daily and lopressor - he has been hypertensive this admission - given his renal function and chest pain, may consider imdur   3. Pre-diabetes - A1c 5.1, controlled with diet   4. 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   Signed, Ledora Bottcher, PA  08/22/2017 12:52 PM   History and all data above reviewed.  Patient examined.  I agree with the findings as above.  He reports chest pain but reports that this only happens with drops in the Hgb.  He says that he ambulates with a walker and does not typically have chest pain.  Denies SOB or associated symptoms with this.  He reports that it can be 5/10 in intensity.  He is not interested in cath and will refuse dialysis or anything that would potentially put him on dialysis.  He is being seen by palliative care. The patient exam reveals COR:RRR  ,  Lungs: Clear  ,  Abd: Positive bowel sounds, no rebound no guarding, Ext No edema  .  All available labs, radiology testing, previous records  reviewed. Agree with documented assessment and plan. NQWMI:  Medical management per his request.  He may or may not be an ASA candidate per the primary team. He is on a good dose of beta blocker.  He, at the least will likely tolerate Imdur and I will start this.  He also needs a fresh bottle of SLNTG on discharge.     Tidus Jona Zappone  3:44 PM  08/22/2017

## 2017-08-23 ENCOUNTER — Encounter (HOSPITAL_COMMUNITY): Payer: Self-pay | Admitting: Cardiology

## 2017-08-23 ENCOUNTER — Encounter (HOSPITAL_COMMUNITY)
Admission: EM | Disposition: A | Discharge status: Hospice | Payer: Self-pay | Source: Home / Self Care | Attending: Internal Medicine

## 2017-08-23 DIAGNOSIS — Z7189 Other specified counseling: Secondary | ICD-10-CM

## 2017-08-23 DIAGNOSIS — I214 Non-ST elevation (NSTEMI) myocardial infarction: Secondary | ICD-10-CM

## 2017-08-23 DIAGNOSIS — Z515 Encounter for palliative care: Secondary | ICD-10-CM

## 2017-08-23 LAB — BASIC METABOLIC PANEL
Anion gap: 13 (ref 5–15)
BUN: 107 mg/dL — AB (ref 6–20)
CHLORIDE: 107 mmol/L (ref 101–111)
CO2: 19 mmol/L — AB (ref 22–32)
Calcium: 8.5 mg/dL — ABNORMAL LOW (ref 8.9–10.3)
Creatinine, Ser: 7.52 mg/dL — ABNORMAL HIGH (ref 0.61–1.24)
GFR calc Af Amer: 7 mL/min — ABNORMAL LOW (ref >60)
GFR calc non Af Amer: 6 mL/min — ABNORMAL LOW (ref >60)
Glucose, Bld: 111 mg/dL — ABNORMAL HIGH (ref 65–99)
POTASSIUM: 4.9 mmol/L (ref 3.5–5.1)
SODIUM: 139 mmol/L (ref 135–145)

## 2017-08-23 LAB — CBC
HCT: 24.4 % — ABNORMAL LOW (ref 39.0–52.0)
HEMOGLOBIN: 8 g/dL — AB (ref 13.0–17.0)
MCH: 31.5 pg (ref 26.0–34.0)
MCHC: 32.8 g/dL (ref 30.0–36.0)
MCV: 96.1 fL (ref 78.0–100.0)
PLATELETS: 154 10*3/uL (ref 150–400)
RBC: 2.54 MIL/uL — AB (ref 4.22–5.81)
RDW: 17.7 % — ABNORMAL HIGH (ref 11.5–15.5)
WBC: 7.8 10*3/uL (ref 4.0–10.5)

## 2017-08-23 LAB — GLUCOSE, CAPILLARY
GLUCOSE-CAPILLARY: 128 mg/dL — AB (ref 65–99)
GLUCOSE-CAPILLARY: 149 mg/dL — AB (ref 65–99)
Glucose-Capillary: 100 mg/dL — ABNORMAL HIGH (ref 65–99)

## 2017-08-23 SURGERY — LEFT HEART CATH AND CORONARY ANGIOGRAPHY
Anesthesia: LOCAL

## 2017-08-23 NOTE — Progress Notes (Signed)
PROGRESS NOTE    Matthew Mays  MOQ:[id number] DOB: [birth year] DOA: 08/21/2017 PCP: Estanislado Emms, MD   Outpatient Specialists:    Brief Narrative:  Matthew Mays  is a 76 y.o. 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     Pt states that the chest pain is all across his upper chest and began tonight while he was sitting in his lazy boy.  Went from the left side to the right side of his chest,  "dull",, no radiation.  Pt denies fever, chills, cough, palp, sob, n/v, heartburn.  Pt states the pain lasted for about 45 minutes,  Nothing appeared to make the pain better or worse. Typically taking 4 baby aspirin causes the pain to go away and when it didn't he decided to call EMS.     Assessment & Plan:   Principal Problem:   Chest pain Active Problems:   End stage renal disease (HCC)   Hyperglycemia   CKD (chronic kidney disease), stage V (HCC)   Anemia   Palliative care by specialist   Advance care planning   Non-ST elevation (NSTEMI) myocardial infarction [facility])   Chest pain Prior Echo 08/13/2017 Appreciate cards adding imdur -- patient has good BP so can titrate up dose -would not want heart cath  Anemia of chronic disease Transfusion ordered by ED -not interested in  GI eval (has seen [facility] in the past) -- colonoscopy/EGD -says procrit not working-- defer to Dr. Florene Glen- have sent message- PRN transfusions? -plan to transfuse again to 9 if able before d/c  ESRD  Not interested in HD  Valvular heart disease (mild AS, mod AR, mild MR) Stable  Dm2 SSI  Diarrhea Intermittent- long standing -limits patient's ability to get to Dr. Thomasene Lot..   DVT prophylaxis:  SCD's  Code Status: Full Code   Family Communication: Daughter (currently on [employer] for vacation)  Disposition Plan:  Home ?   Consultants:   Cards  Palliative care   Subjective: No  further chest pain  Objective: Vitals:   08/22/17 0759 08/22/17 1326 08/22/17 1946 08/23/17 0657  BP: (!) 157/56 (!) 141/48 (!) 147/60 (!) 140/54  Pulse: 80 87 99 87  Resp:  18 18 18   Temp: 98.2 F (36.8 C) 97.7 F (36.5 C) 97.7 F (36.5 C) 97.7 F (36.5 C)  TempSrc: Oral Oral Oral Oral  SpO2: 99% 100% 100% 100%  Weight:    74.9 kg (165 lb 3.2 oz)  Height:        Intake/Output Summary (Last 24 hours) at 08/23/17 1309 Last data filed at 08/23/17 1216  Gross per 24 hour  Intake              480 ml  Output             1112 ml  Net             -632 ml   Filed Weights   08/21/17 2332 08/22/17 0658 08/23/17 0657  Weight: 74.6 kg (164 lb 8 oz) 75.5 kg (166 lb 8 oz) 74.9 kg (165 lb 3.2 oz)    Examination:  General exam: In bed- NAD Respiratory system: clear Cardiovascular system: rrr Gastrointestinal system: +Bs, soft Central nervous system: A+Ox3 Psychiatry: normal mood    Data Reviewed: I have personally reviewed following labs and imaging studies  CBC:  Recent Labs Lab 08/21/17 2030  08/22/17 0531 08/23/17 0854  WBC 8.9 7.8 7.8  HGB 7.3* 8.3* 8.0*  HCT 22.2* 24.9* 24.4*  MCV 96.1 94.3 96.1  PLT 163 156 025   Basic Metabolic Panel:  Recent Labs Lab 08/21/17 2030 08/22/17 0531 08/23/17 0854  NA 134* 136 139  K 4.3 4.5 4.9  CL 99* 102 107  CO2 19* 19* 19*  GLUCOSE 209* 87 111*  BUN 107* 107* 107*  CREATININE 7.50* 7.54* 7.52*  CALCIUM 9.1 8.8* 8.5*   GFR: Estimated Creatinine Clearance: 8.6 mL/min (A) (by C-G formula based on SCr of 7.52 mg/dL (H)). Liver Function Tests:  Recent Labs Lab 08/22/17 0531  AST 15  ALT 11*  ALKPHOS 59  BILITOT 0.9  PROT 6.4*  ALBUMIN 2.9*   No results for input(s): LIPASE, AMYLASE in the last 168 hours. No results for input(s): AMMONIA in the last 168 hours. Coagulation Profile: No results for input(s): INR, PROTIME in the last 168 hours. Cardiac Enzymes:  Recent Labs Lab 08/22/17 0002 08/22/17 0531  08/22/17 1114  TROPONINI 0.38* 0.93* 1.04*   BNP (last 3 results) No results for input(s): PROBNP in the last 8760 hours. HbA1C:  Recent Labs  08/22/17 0002  HGBA1C 5.1   CBG:  Recent Labs Lab 08/22/17 0745 08/22/17 1110 08/22/17 1640 08/22/17 2234 08/23/17 0750  GLUCAP 101* 95 122* 97 100*   Lipid Profile: No results for input(s): CHOL, HDL, LDLCALC, TRIG, CHOLHDL, LDLDIRECT in the last 72 hours. Thyroid Function Tests: No results for input(s): TSH, T4TOTAL, FREET4, T3FREE, THYROIDAB in the last 72 hours. Anemia Panel: No results for input(s): VITAMINB12, FOLATE, FERRITIN, TIBC, IRON, RETICCTPCT in the last 72 hours. Urine analysis:    Component Value Date/Time   COLORURINE YELLOW 06/19/2016 0936   APPEARANCEUR HAZY (A) 06/19/2016 0936   LABSPEC 1.014 06/19/2016 0936   PHURINE 6.5 06/19/2016 0936   GLUCOSEU NEGATIVE 06/19/2016 0936   HGBUR MODERATE (A) 06/19/2016 0936   BILIRUBINUR NEGATIVE 06/19/2016 0936   KETONESUR NEGATIVE 06/19/2016 0936   PROTEINUR 100 (A) 06/19/2016 0936   NITRITE NEGATIVE 06/19/2016 0936   LEUKOCYTESUR MODERATE (A) 06/19/2016 0936    ) Recent Results (from the past 240 hour(s))  Gastrointestinal Panel by PCR , Stool     Status: None   Collection Time: 08/22/17 10:34 AM  Result Value Ref Range Status   Campylobacter species NOT DETECTED NOT DETECTED Final   Plesimonas shigelloides NOT DETECTED NOT DETECTED Final   Salmonella species NOT DETECTED NOT DETECTED Final   Yersinia enterocolitica NOT DETECTED NOT DETECTED Final   Vibrio species NOT DETECTED NOT DETECTED Final   Vibrio cholerae NOT DETECTED NOT DETECTED Final   Enteroaggregative E coli (EAEC) NOT DETECTED NOT DETECTED Final   Enteropathogenic E coli (EPEC) NOT DETECTED NOT DETECTED Final   Enterotoxigenic E coli (ETEC) NOT DETECTED NOT DETECTED Final   Shiga like toxin producing E coli (STEC) NOT DETECTED NOT DETECTED Final   Shigella/Enteroinvasive E coli (EIEC) NOT  DETECTED NOT DETECTED Final   Cryptosporidium NOT DETECTED NOT DETECTED Final   Cyclospora cayetanensis NOT DETECTED NOT DETECTED Final   Entamoeba histolytica NOT DETECTED NOT DETECTED Final   Giardia lamblia NOT DETECTED NOT DETECTED Final   Adenovirus F40/41 NOT DETECTED NOT DETECTED Final   Astrovirus NOT DETECTED NOT DETECTED Final   Norovirus GI/GII NOT DETECTED NOT DETECTED Final   Rotavirus A NOT DETECTED NOT DETECTED Final   Sapovirus (I, II, IV, and V) NOT DETECTED NOT DETECTED Final  C  difficile quick scan w PCR reflex     Status: None   Collection Time: 08/22/17 10:34 AM  Result Value Ref Range Status   C Diff antigen NEGATIVE NEGATIVE Final   C Diff toxin NEGATIVE NEGATIVE Final   C Diff interpretation No C. difficile detected.  Final      Anti-infectives    None       Radiology Studies: Dg Chest 2 View  Result Date: 08/21/2017 CLINICAL DATA:  Chest pain EXAM: CHEST  2 VIEW COMPARISON:  08/12/2017 FINDINGS: Hyper inflation. No large pleural effusion. Mild hazy bibasilar atelectasis or small infiltrates. Chronic bronchitic changes. Stable cardiomediastinal silhouette with atherosclerosis. IMPRESSION: Hyperinflation with mild hazy bibasilar atelectasis versus small infiltrates. Electronically Signed   By: Donavan Foil M.D.   On: 08/21/2017 21:34        Scheduled Meds: . calcium carbonate  4 tablet Oral BID AC  . dorzolamide  1 drop Right Eye BID  . ferrous sulfate  325 mg Oral BID WC  . furosemide  80 mg Oral Daily  . insulin aspart  0-9 Units Subcutaneous TID WC  . isosorbide mononitrate  60 mg Oral Daily  . metoprolol tartrate  100 mg Oral BID  . sodium bicarbonate  650 mg Oral BID   Continuous Infusions:   LOS: 1 day    Time spent: 25 min    [place], DO [facility] Pager [phone]  If 7PM-7AM, please contact night-coverage [email or web address] Password [email or web address] 08/23/2017, 1:09 PM

## 2017-08-23 NOTE — Progress Notes (Addendum)
Daily Progress Note   Patient Name: Matthew Mays       Date: 08/23/2017 DOB: Feb 13, 1941  Age: 76 y.o. MRN#: [id number] Attending Physician: Geradine Girt, DO Primary Care Physician: Estanislado Emms, MD Admit Date: 08/21/2017  Reason for Consultation/Follow-up: Establishing goals of care  Subjective: Sitting up in bed eating lunch. No complaints. Feels good.   Length of Stay: 1  Current Medications: Scheduled Meds:  . calcium carbonate  4 tablet Oral BID AC  . dorzolamide  1 drop Right Eye BID  . ferrous sulfate  325 mg Oral BID WC  . furosemide  80 mg Oral Daily  . insulin aspart  0-9 Units Subcutaneous TID WC  . isosorbide mononitrate  60 mg Oral Daily  . metoprolol tartrate  100 mg Oral BID  . sodium bicarbonate  650 mg Oral BID    Continuous Infusions:   PRN Meds: acetaminophen **OR** acetaminophen  Physical Exam  Constitutional: He is oriented to person, place, and time. He appears well-developed and well-nourished. He is cooperative. No distress.  HENT:  Head: Normocephalic and atraumatic.  Pulmonary/Chest: Effort normal. No respiratory distress.  Neurological: He is alert and oriented to person, place, and time.  Skin: Skin is warm and dry.  Psychiatric: He has a normal mood and affect. His behavior is normal. Judgment and thought content normal.            Vital Signs: BP (!) 140/54 (BP Location: Right Arm)   Pulse 87   Temp 97.7 F (36.5 C) (Oral)   Resp 18   Ht '5\' 10"'$  (1.778 m)   Wt 74.9 kg (165 lb 3.2 oz) Comment: scale a  SpO2 100%   BMI 23.70 kg/m  SpO2: SpO2: 100 % O2 Device: O2 Device: Not Delivered O2 Flow Rate:    Intake/output summary:  Intake/Output Summary (Last 24 hours) at 08/23/17 1443 Last data filed at 08/23/17 1216  Gross per 24 hour  Intake               480 ml  Output             1111 ml  Net             -631 ml   LBM: Last BM Date: 08/22/17 Baseline Weight: Weight: 74.8 kg (165 lb) Most recent weight: Weight: 74.9 kg (165 lb 3.2 oz) (scale a)       Palliative Assessment/Data: 60%    Flowsheet Rows     Most Recent Value  Intake Tab  Referral Department  Hospitalist  Unit at Time of Referral  Cardiac/Telemetry Unit  Palliative Care Primary Diagnosis  Cardiac  Date Notified  08/22/17  Palliative Care Type  New Palliative care  Reason for referral  Clarify Goals of Care  Date of Admission  08/21/17  Date first seen by Palliative Care  08/22/17  # of days Palliative referral response time  0 Day(s)  # of days IP prior to Palliative referral  1  Clinical Assessment  Palliative Performance Scale Score  60%  Psychosocial & Spiritual Assessment  Palliative Care Outcomes  Patient/Family meeting held?  Yes  Palliative Care Outcomes  Clarified goals of care,  Counseled regarding hospice  Patient/Family wishes: Interventions discontinued/not started   Mechanical Ventilation      Patient Active Problem List   Diagnosis Date Noted  . Palliative care by specialist   . Advance care planning   . Non-ST elevation (NSTEMI) myocardial infarction ([place])   . Chest pain 08/12/2017  . Diarrhea 07/22/2017  . Hyponatremia 07/22/2017  . Hypokalemia 07/22/2017  . Elevated troponin 07/22/2017  . Chest discomfort 07/22/2017  . Hyperkalemia 07/22/2017  . Anemia 07/21/2017  . Glaucoma 07/21/2017  . UTI (lower urinary tract infection) 06/19/2016  . Sepsis ([place]) 06/19/2016  . Hyperglycemia 06/19/2016  . Hypertension 06/19/2016  . AKI (acute kidney injury) ([place]) 06/19/2016  . CKD (chronic kidney disease), stage V ([place]) 06/19/2016  . End stage renal disease ([place]) 03/22/2012    Palliative Care Assessment & Plan   HPI: 76 y.o. male  with past medical history of HTN, HLD, DM2, mild aortic stenosis, moderate aortic regurgitation, mild  mitral regurgitation, ESRD (not on HD), and anemia admitted on 08/21/2017 with chest pain. Positive troponin and EKG changes. High risk for ACS. Patient currently refusing cardiac cath d/t risk for HD. Has refused HD for years. Of note, has also refused GI evaluation for chronic diarrhea and recurrent anemia.   Assessment: This NP and Vinie Sill, NP met with patient at bedside. Revisited conversation from yesterday. Patient shares that he has shared his decisions with his children. Agreeable to [place] calling his daughter and updating her. Discussed options for going home - patient interested in [facility] - outpatient palliative. He does not want aggressive therapies but is interested in continuing blood transfusions because he feels this improves his quality of life. He is very interested in developing a plan to have a transfusion provided to him as an outpatient vs waiting and having to be admitted into the hospital. I wonder if this could be provided through [facility] or like a Short Stay??   Recommendations/Plan:  Discharge with [facility] - outpatient palliative with plan to ultimately transition to hospice at some point.   Goals of Care and Additional Recommendations:  Limitations on Scope of Treatment: No Hemodialysis  Code Status:  DNR  Prognosis:   Unable to determine  Discharge Planning:  Home with [facility] plan was discussed with patient and RN. Called and updated daughter, Di Kindle.  Thank you for allowing the Palliative Medicine Team to assist in the care of this patient.   Time In: 13:00 Time Out: 13:40 Total Time 40 minutes Prolonged Time Billed  no       Greater than 50%  of this time was spent counseling and coordinating care related to the above assessment and plan.  Juel Burrow, DNP, AGNP-C Palliative Medicine Team Team Phone # [phone]

## 2017-08-23 NOTE — Evaluation (Signed)
Physical Therapy Evaluation Patient Details Name: Matthew Mays MRN: [id number] DOB: July 03, 1941 Today's Date: 08/23/2017   History of Present Illness  JamesCrewis a 76 y.o.male,w Hypertension, Hyperlipidemia, Dm2 , Mild Aortic stenosis, Mod AR, Mild MR, ESRD not on HD, Anemia who presents w c/o chest pain. Patient with anemia.     PMH:  AS, bladder CA, CKD, anemia, HTN, DM  Clinical Impression  Patient presents with problems listed below.  Will benefit from acute PT to maximize functional independence prior to discharge.  Patient lives alone, and was at Mod I functional level.  Currently requires assist for mobility/gait.  Recommend ST-SNF at d/c for continued therapy with goal to return home.    Follow Up Recommendations SNF;Supervision/Assistance - 24 hour    Equipment Recommendations  None recommended by PT    Recommendations for Other Services       Precautions / Restrictions Precautions Precautions: Fall Restrictions Weight Bearing Restrictions: No      Mobility  Bed Mobility Overal bed mobility: Needs Assistance Bed Mobility: Supine to Sit;Sit to Supine     Supine to sit: Min assist Sit to supine: Min guard   General bed mobility comments: Assist to bring trunk to upright position at end range.  Transfers Overall transfer level: Needs assistance Equipment used: Rolling walker (2 wheeled) Transfers: Sit to/from Stand Sit to Stand: Min guard         General transfer comment: Min guard for safety.  Ambulation/Gait Ambulation/Gait assistance: Min guard Ambulation Distance (Feet): 48 Feet Assistive device: Rolling walker (2 wheeled) Gait Pattern/deviations: Step-through pattern;Decreased stride length;Shuffle;Trunk flexed Gait velocity: decreased Gait velocity interpretation: Below normal speed for age/gender General Gait Details: Patient with slow, shuffling gait with flexed posture.  Cues to stand upright.  No loss of balance during gait with  RW.  Stairs            Wheelchair Mobility    Modified Rankin (Stroke Patients Only)       Balance Overall balance assessment: Needs assistance Sitting-balance support: No upper extremity supported;Feet supported Sitting balance-Leahy Scale: Good Sitting balance - Comments: Flexed posture   Standing balance support: Bilateral upper extremity supported Standing balance-Leahy Scale: Poor                               Pertinent Vitals/Pain Pain Assessment: No/denies pain    Home Living Family/patient expects to be discharged to:: Private residence Living Arrangements: Alone Available Help at Discharge: Other (Comment) (No assistance per patient) Type of Home: House Home Access: Stairs to enter Entrance Stairs-Rails: Right;Left Entrance Stairs-Number of Steps: 2 Home Layout: One level Home Equipment: Walker - 2 wheels;Walker - 4 wheels;Bedside commode;Grab bars - toilet;Shower seat - built in;Wheelchair - manual      Prior Function Level of Independence: Independent with assistive device(s)         Comments: Drives.  Uses RW     Hand Dominance   Dominant Hand: Right    Extremity/Trunk Assessment   Upper Extremity Assessment Upper Extremity Assessment: Overall WFL for tasks assessed    Lower Extremity Assessment Lower Extremity Assessment: Generalized weakness    Cervical / Trunk Assessment Cervical / Trunk Assessment: Kyphotic  Communication   Communication: No difficulties  Cognition Arousal/Alertness: Awake/alert Behavior During Therapy: WFL for tasks assessed/performed Overall Cognitive Status: Within Functional Limits for tasks assessed  General Comments      Exercises     Assessment/Plan    PT Assessment Patient needs continued PT services  PT Problem List Decreased strength;Decreased activity tolerance;Decreased mobility;Decreased balance;Decreased knowledge of use  of DME;Cardiopulmonary status limiting activity       PT Treatment Interventions DME instruction;Gait training;Functional mobility training;Therapeutic activities;Stair training;Therapeutic exercise;Patient/family education    PT Goals (Current goals can be found in the Care Plan section)  Acute Rehab PT Goals Patient Stated Goal: To get stronger PT Goal Formulation: With patient Time For Goal Achievement: 08/30/17 Potential to Achieve Goals: Fair    Frequency Min 3X/week   Barriers to discharge Decreased caregiver support Lives alone. No support.    Co-evaluation               AM-PAC PT "6 Clicks" Daily Activity  Outcome Measure Difficulty turning over in bed (including adjusting bedclothes, sheets and blankets)?: None Difficulty moving from lying on back to sitting on the side of the bed? : Unable Difficulty sitting down on and standing up from a chair with arms (e.g., wheelchair, bedside commode, etc,.)?: None Help needed moving to and from a bed to chair (including a wheelchair)?: A Little Help needed walking in hospital room?: A Little Help needed climbing 3-5 steps with a railing? : A Lot 6 Click Score: 17    End of Session Equipment Utilized During Treatment: Gait belt Activity Tolerance: Patient limited by fatigue Patient left: in bed;with call bell/phone within reach Nurse Communication: Mobility status PT Visit Diagnosis: Unsteadiness on feet (R26.81);Other abnormalities of gait and mobility (R26.89);Muscle weakness (generalized) (M62.81)    Time: 2122-4825 PT Time Calculation (min) (ACUTE ONLY): 22 min   Charges:   PT Evaluation $PT Eval Moderate Complexity: 1 Mod     PT G Codes:        Carita Pian. Sanjuana Kava, [facility] Acute Rehab Services Pager [place] 08/23/2017, 3:42 PM

## 2017-08-23 NOTE — Progress Notes (Signed)
Progress Note  Patient Name: Matthew Mays Date of Encounter: 08/23/2017  Primary Cardiologist:   (New)  Dr. Percival Spanish  Subjective   No chest pain.  No SOB.   Inpatient Medications    Scheduled Meds: . calcium carbonate  4 tablet Oral BID AC  . dorzolamide  1 drop Right Eye BID  . ferrous sulfate  325 mg Oral BID WC  . furosemide  80 mg Oral Daily  . insulin aspart  0-9 Units Subcutaneous TID WC  . isosorbide mononitrate  60 mg Oral Daily  . metoprolol tartrate  100 mg Oral BID  . sodium bicarbonate  650 mg Oral BID   Continuous Infusions:  PRN Meds: acetaminophen **OR** acetaminophen   Vital Signs    Vitals:   08/22/17 0759 08/22/17 1326 08/22/17 1946 08/23/17 0657  BP: (!) 157/56 (!) 141/48 (!) 147/60 (!) 140/54  Pulse: 80 87 99 87  Resp:  18 18 18   Temp: 98.2 F (36.8 C) 97.7 F (36.5 C) 97.7 F (36.5 C) 97.7 F (36.5 C)  TempSrc: Oral Oral Oral Oral  SpO2: 99% 100% 100% 100%  Weight:    165 lb 3.2 oz (74.9 kg)  Height:        Intake/Output Summary (Last 24 hours) at 08/23/17 0904 Last data filed at 08/23/17 0843  Gross per 24 hour  Intake              480 ml  Output             1363 ml  Net             -883 ml   Filed Weights   08/21/17 2332 08/22/17 0658 08/23/17 0657  Weight: 164 lb 8 oz (74.6 kg) 166 lb 8 oz (75.5 kg) 165 lb 3.2 oz (74.9 kg)    Telemetry    NSR - Personally Reviewed  ECG    NA - Personally Reviewed  Physical Exam   GEN: No acute distress.   Neck: No  JVD Cardiac: RRR, no murmurs, rubs, or gallops.  Respiratory: Clear to auscultation bilaterally. GI: Soft, nontender, non-distended  MS: No  edema; No deformity. Neuro:  Nonfocal  Psych: Normal affect   Labs    Chemistry Recent Labs Lab 08/21/17 2030 08/22/17 0531  NA 134* 136  K 4.3 4.5  CL 99* 102  CO2 19* 19*  GLUCOSE 209* 87  BUN 107* 107*  CREATININE 7.50* 7.54*  CALCIUM 9.1 8.8*  PROT  --  6.4*  ALBUMIN  --  2.9*  AST  --  15  ALT  --  11*    ALKPHOS  --  59  BILITOT  --  0.9  GFRNONAA 6* 6*  GFRAA 7* 7*  ANIONGAP 16* 15     Hematology Recent Labs Lab 08/21/17 2030 08/22/17 0531  WBC 8.9 7.8  RBC 2.31* 2.64*  HGB 7.3* 8.3*  HCT 22.2* 24.9*  MCV 96.1 94.3  MCH 31.6 31.4  MCHC 32.9 33.3  RDW 16.4* 17.1*  PLT 163 156    Cardiac Enzymes Recent Labs Lab 08/22/17 0002 08/22/17 0531 08/22/17 1114  TROPONINI 0.38* 0.93* 1.04*    Recent Labs Lab 08/21/17 2042  TROPIPOC 0.04     BNPNo results for input(s): BNP, PROBNP in the last 168 hours.   DDimer No results for input(s): DDIMER in the last 168 hours.   Radiology    Dg Chest 2 View  Result Date: 08/21/2017 CLINICAL DATA:  Chest  pain EXAM: CHEST  2 VIEW COMPARISON:  08/12/2017 FINDINGS: Hyper inflation. No large pleural effusion. Mild hazy bibasilar atelectasis or small infiltrates. Chronic bronchitic changes. Stable cardiomediastinal silhouette with atherosclerosis. IMPRESSION: Hyperinflation with mild hazy bibasilar atelectasis versus small infiltrates. Electronically Signed   By: Donavan Foil M.D.   On: 08/21/2017 21:34    Cardiac Studies   Study Conclusions  ECHO 08/13/17  - Left ventricle: The cavity size was normal. There was mild   concentric hypertrophy. Systolic function was normal. The   estimated ejection fraction was in the range of 55% to 60%. Wall   motion was normal; there were no regional wall motion   abnormalities. Doppler parameters are consistent with abnormal   left ventricular relaxation (grade 1 diastolic dysfunction).   Doppler parameters are consistent with high ventricular filling   pressure. - Aortic valve: Valve mobility was restricted. There was mild   stenosis. There was moderate regurgitation. Valve area (VTI):   1.18 cm^2. Valve area (Vmax): 1.13 cm^2. Valve area (Vmean): 1.07   cm^2. - Mitral valve: Mildly calcified annulus. Transvalvular velocity   was within the normal range. There was no evidence for  stenosis.   There was mild regurgitation. - Left atrium: The atrium was mildly dilated. - Right ventricle: The cavity size was normal. Wall thickness was   normal. Systolic function was normal. - Atrial septum: No defect or patent foramen ovale was identified. - Tricuspid valve: There was trivial regurgitation.   Patient Profile     76 y.o. male with a hx of HTN, ESRD not on HD believed to be secondary to obstruction related to bladder cancer, mild aortic stenosis, mild mitral regurgitation, pre-diabetes, and secondary hyperparathyroidism who is being seen for the evaluation of elevated troponin at the request of Dr. Eliseo Squires.  Assessment & Plan    Chest pain, elevated troponin with EKG changes Medical management without cath per his request.  Imdur added yesterday.  No change in meds.    HTN BP is OK.  He would tolerate increased Imdur if he has recurrent pain.    We will sign off.  Please call with further questions.    Signed, Minus Breeding, MD  08/23/2017, 9:04 AM

## 2017-08-24 LAB — GLUCOSE, CAPILLARY
GLUCOSE-CAPILLARY: 109 mg/dL — AB (ref 65–99)
GLUCOSE-CAPILLARY: 115 mg/dL — AB (ref 65–99)
GLUCOSE-CAPILLARY: 91 mg/dL (ref 65–99)
Glucose-Capillary: 94 mg/dL (ref 65–99)
Glucose-Capillary: 115 mg/dL — ABNORMAL HIGH (ref 65–99)
Glucose-Capillary: 125 mg/dL — ABNORMAL HIGH (ref 65–99)

## 2017-08-24 LAB — CBC
HCT: 23.8 % — ABNORMAL LOW (ref 39.0–52.0)
Hemoglobin: 7.9 g/dL — ABNORMAL LOW (ref 13.0–17.0)
MCH: 31.9 pg (ref 26.0–34.0)
MCHC: 33.2 g/dL (ref 30.0–36.0)
MCV: 96 fL (ref 78.0–100.0)
PLATELETS: 155 10*3/uL (ref 150–400)
RBC: 2.48 MIL/uL — AB (ref 4.22–5.81)
RDW: 17.5 % — AB (ref 11.5–15.5)
WBC: 7.6 10*3/uL (ref 4.0–10.5)

## 2017-08-24 LAB — PREPARE RBC (CROSSMATCH)

## 2017-08-24 LAB — HEMOGLOBIN AND HEMATOCRIT, BLOOD
HCT: 28.6 % — ABNORMAL LOW (ref 39.0–52.0)
Hemoglobin: 9.6 g/dL — ABNORMAL LOW (ref 13.0–17.0)

## 2017-08-24 MED ORDER — SODIUM CHLORIDE 0.9 % IV SOLN
Freq: Once | INTRAVENOUS | Status: AC
  Administered 2017-08-24: 08:00:00 via INTRAVENOUS

## 2017-08-24 NOTE — Progress Notes (Signed)
Daily Progress Note   Patient Name: Matthew Mays       Date: 08/24/2017 DOB: 01/05/1941  Age: 76 y.o. MRN#: [id number] Attending Physician: Geradine Girt, DO Primary Care Physician: Estanislado Emms, MD Admit Date: 08/21/2017  Reason for Consultation/Follow-up: Disposition and Establishing goals of care  Subjective: Sitting up in bed eating lunch. No complaints. Feels good. Receiving 2 units of blood. Excited to go home tomorrow.   Length of Stay: 2  Current Medications: Scheduled Meds:  . calcium carbonate  4 tablet Oral BID AC  . dorzolamide  1 drop Right Eye BID  . ferrous sulfate  325 mg Oral BID WC  . furosemide  80 mg Oral Daily  . insulin aspart  0-9 Units Subcutaneous TID WC  . isosorbide mononitrate  60 mg Oral Daily  . metoprolol tartrate  100 mg Oral BID  . sodium bicarbonate  650 mg Oral BID    Continuous Infusions:   PRN Meds: acetaminophen **OR** acetaminophen  Physical Exam  Constitutional: He is oriented to person, place, and time. He appears well-developed and well-nourished. No distress.  HENT:  Head: Normocephalic and atraumatic.  Pulmonary/Chest: Effort normal and breath sounds normal. No respiratory distress.  Neurological: He is alert and oriented to person, place, and time.  Skin: Skin is warm and dry. Capillary refill takes less than 2 seconds. He is not diaphoretic.  Psychiatric: He has a normal mood and affect. His behavior is normal. Judgment and thought content normal.            Vital Signs: BP (!) 145/56 (BP Location: Right Arm)   Pulse 72   Temp 98.1 F (36.7 C) (Oral)   Resp 18   Ht '5\' 10"'$  (1.778 m)   Wt 75.3 kg (166 lb)   SpO2 100%   BMI 23.82 kg/m  SpO2: SpO2: 100 % O2 Device: O2 Device: Not Delivered O2 Flow Rate:    Intake/output  summary:  Intake/Output Summary (Last 24 hours) at 08/24/17 1325 Last data filed at 08/24/17 1205  Gross per 24 hour  Intake              549 ml  Output              925 ml  Net             -376 ml   LBM: Last BM Date: 08/20/17 Baseline Weight: Weight: 74.8 kg (165 lb) Most recent weight: Weight: 75.3 kg (166 lb)       Palliative Assessment/Data: 60%    Flowsheet Rows     Most Recent Value  Intake Tab  Referral Department  Hospitalist  Unit at Time of Referral  Cardiac/Telemetry Unit  Palliative Care Primary Diagnosis  Cardiac  Date Notified  08/22/17  Palliative Care Type  New Palliative care  Reason for referral  Clarify Goals of Care  Date of Admission  08/21/17  Date first seen by Palliative Care  08/22/17  # of days Palliative referral response time  0 Day(s)  # of days IP prior to Palliative referral  1  Clinical Assessment  Palliative Performance Scale Score  60%  Psychosocial & Spiritual Assessment  Palliative  Care Outcomes  Patient/Family meeting held?  Yes  Palliative Care Outcomes  Clarified goals of care, Counseled regarding hospice  Patient/Family wishes: Interventions discontinued/not started   Mechanical Ventilation      Patient Active Problem List   Diagnosis Date Noted  . Palliative care by specialist   . Advance care planning   . Non-ST elevation (NSTEMI) myocardial infarction ([place])   . Chest pain 08/12/2017  . Diarrhea 07/22/2017  . Hyponatremia 07/22/2017  . Hypokalemia 07/22/2017  . Elevated troponin 07/22/2017  . Chest discomfort 07/22/2017  . Hyperkalemia 07/22/2017  . Anemia 07/21/2017  . Glaucoma 07/21/2017  . UTI (lower urinary tract infection) 06/19/2016  . Sepsis ([place]) 06/19/2016  . Hyperglycemia 06/19/2016  . Hypertension 06/19/2016  . AKI (acute kidney injury) ([place]) 06/19/2016  . CKD (chronic kidney disease), stage V ([place]) 06/19/2016  . End stage renal disease ([place]) 03/22/2012    Palliative Care Assessment & Plan    HPI: 76 y.o.malewith past medical history of HTN, HLD, DM2, mild aortic stenosis, moderate aortic regurgitation, mild mitral regurgitation, ESRD (not on HD), and anemiaadmitted on 10/28/2018with chest pain. Positive troponin and EKG changes. High risk for ACS. Patient currently refusing cardiac cath d/t risk for HD. Has refused HD for years. Of note, has also refused GI evaluation for chronic diarrhea and recurrent anemia.   Assessment: Met with patient at bedside. Patient is receiving 2 units of blood with a plan to discharge home tomorrow with [facility] and outpatient palliative care through [facility]. Plans for outpatient transfusions in the short stay department as needed . Patient is happy with this plan.  Called daughter, Di Kindle, to discuss this plan - she is agreeable and thankful for services. She did ask me to include in my note for the outpatient palliative provider that there may be push-back from a ?neighbor who helps patients with iADLS - picks up groceries?. If this person were to cause trouble for outpatient palliative care then daughter, Di Kindle [phone]), should be contacted and she will handle the issue.    Discussed with Di Kindle that palliative care services can easily transition to Hospice care when appropriate and she is agreeable.    Recommendations/Plan:  Discharge home with outpatient palliative through [facility] blood transfusions addressed by case management  Goals of Care and Additional Recommendations:  Limitations on Scope of Treatment: No Hemodialysis  Code Status:  DNR  Prognosis:   Unable to determine  Discharge Planning:  Home with Palliative Services  Care plan was discussed with patient, daughter, Dr. Eliseo Squires  Thank you for allowing the Palliative Medicine Team to assist in the care of this patient.   Total Time 30 minutes Prolonged Time Billed  no       Greater than 50%  of this time was spent  counseling and coordinating care related to the above assessment and plan.  Juel Burrow, DNP, AGNP-C Palliative Medicine Team Team Phone # [phone]

## 2017-08-24 NOTE — Care Management Note (Addendum)
Case Management Note  Patient Details  Name: Matthew Mays MRN: [id number] Date of Birth: Jul 07, 1941  Subjective/Objective:    Chest pain            Action/Plan: Patient lives at home alone; PCP: Estanislado Emms, MD; has private insurance with Medicare/ BCBS with prescription drug coverage; patient does not want to be placed in a SNF at this time and plans to return home with [facility] services; [facility] choice offered, pt chose [facility]; Miranda with [facility] called for arrangements. Patient is agreeable to Outpatient Palliative Care, referral made with [facility]; Elita Boone with Hospice called for arrangements; MD to arrange for plans for outpatient transfusions, CM spoke to [facility] in the Short Stay Dept; MD will have to complete paperwork for the transfusion and Short stay will determine when they will have availability to do the procedure; CM will continue to follow for DCP  Expected Discharge Date:     Possibly 08/27/2017             Expected Discharge Plan:  [place]  In-House Referral:   Palliative Care  Discharge planning Services  CM Consult  Choice offered to:  Patient  HH Arranged:  RN, PT, OT, Nurse's Aide [place] Agency:  [facility]  Status of Service:  In process, will continue to follow:  Sherrilyn Rist [phone] 08/24/2017, 10:41 AM

## 2017-08-24 NOTE — Consult Note (Signed)
   [facility] CM Inpatient Consult   08/24/2017  Matthew Mays [birth year] [id number]  Referral received from inpatient [facility] and patient discussed in the Progression meeting.  Patient is assessed for re-admission in the Medicare ACO.[name]  Patient is currently being seen by palliative care services and home health for disposition needs. Chart review reveals the patient chose his home health agency and palliative care follow up agency.  Patient will receive his community care management needs.  No [facility] Care Management needs assessed. Spoke with inpatient RNCM regarding referral and she states patient has chosen a hospice palliative agency.   For questions or changes, please contact:  Natividad Brood, RN BSN [facility] Liaison  [phone] business mobile phone Toll free office [phone]

## 2017-08-24 NOTE — Progress Notes (Signed)
Discontinued patient's enteric precautions. Both C-diff and GI panel returned negative

## 2017-08-24 NOTE — Progress Notes (Signed)
PROGRESS NOTE    Matthew Mays  EGB:[id number] DOB: 05-12-1941 DOA: 08/21/2017 PCP: Estanislado Emms, MD   Outpatient Specialists:    Brief Narrative:  Matthew Mays  is a 76 y.o. 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     Pt states that the chest pain is all across his upper chest and began tonight while he was sitting in his lazy boy.  Went from the left side to the right side of his chest,  "dull",, no radiation.  Pt denies fever, chills, cough, palp, sob, n/v, heartburn.  Pt states the pain lasted for about 45 minutes,  Nothing appeared to make the pain better or worse. Typically taking 4 baby aspirin causes the pain to go away and when it didn't he decided to call EMS.     Assessment & Plan:   Principal Problem:   Chest pain Active Problems:   End stage renal disease (HCC)   Hyperglycemia   CKD (chronic kidney disease), stage V (HCC)   Anemia   Palliative care by specialist   Advance care planning   Non-ST elevation (NSTEMI) myocardial infarction [facility])   Chest pain Prior Echo 08/13/2017 Appreciate cards adding imdur -- patient has good BP so can titrate up dose -would not want heart cath  Anemia of chronic disease Transfusion ordered by ED -not interested in  GI eval (has seen [facility] in the past) -- colonoscopy/EGD -says procrit not working-- defer to Dr. Florene Glen- have sent message- PRN transfusions? -since patient with active cardiac issues, will give 2 units PRBC today and plan for d/c in AM -will need close outpatient follow up  ESRD  Not interested in HD  Valvular heart disease (mild AS, mod AR, mild MR) Stable  Dm2 SSI  Diarrhea Intermittent- long standing -limits patient's ability to get to Dr. Thomasene Lot..   DVT prophylaxis:  SCD's  Code Status: Full Code   Family Communication: Daughter (currently on [employer] for vacation)  Disposition  Plan:  Home in AM   Consultants:   Cards  Palliative care   Subjective: Still with some SOB with exertion  Objective: Vitals:   08/24/17 0945 08/24/17 1015 08/24/17 1200 08/24/17 1240  BP: (!) 158/56 (!) 167/62 (!) 148/56 (!) 145/56  Pulse: 86 79 72 72  Resp: 18 18 18 18   Temp: 97.7 F (36.5 C) 97.6 F (36.4 C) 97.7 F (36.5 C) 98.1 F (36.7 C)  TempSrc: Oral Oral Oral Oral  SpO2: 100% 100% 100% 100%  Weight:      Height:        Intake/Output Summary (Last 24 hours) at 08/24/17 1301 Last data filed at 08/24/17 1205  Gross per 24 hour  Intake              549 ml  Output              925 ml  Net             -376 ml   Filed Weights   08/22/17 0658 08/23/17 0657 08/24/17 0439  Weight: 75.5 kg (166 lb 8 oz) 74.9 kg (165 lb 3.2 oz) 75.3 kg (166 lb)    Examination:  General exam: NAD Respiratory system: diminished, no wheezing Cardiovascular system: rrr Gastrointestinal system: +BS, soft Central nervous system: alert, pleasant     Data Reviewed: I have personally reviewed following labs and  imaging studies  CBC:  Recent Labs Lab 08/21/17 2030 08/22/17 0531 08/23/17 0854 08/24/17 0436  WBC 8.9 7.8 7.8 7.6  HGB 7.3* 8.3* 8.0* 7.9*  HCT 22.2* 24.9* 24.4* 23.8*  MCV 96.1 94.3 96.1 96.0  PLT 163 156 154 811   Basic Metabolic Panel:  Recent Labs Lab 08/21/17 2030 08/22/17 0531 08/23/17 0854  NA 134* 136 139  K 4.3 4.5 4.9  CL 99* 102 107  CO2 19* 19* 19*  GLUCOSE 209* 87 111*  BUN 107* 107* 107*  CREATININE 7.50* 7.54* 7.52*  CALCIUM 9.1 8.8* 8.5*   GFR: Estimated Creatinine Clearance: 8.6 mL/min (A) (by C-G formula based on SCr of 7.52 mg/dL (H)). Liver Function Tests:  Recent Labs Lab 08/22/17 0531  AST 15  ALT 11*  ALKPHOS 59  BILITOT 0.9  PROT 6.4*  ALBUMIN 2.9*   No results for input(s): LIPASE, AMYLASE in the last 168 hours. No results for input(s): AMMONIA in the last 168 hours. Coagulation Profile: No results for  input(s): INR, PROTIME in the last 168 hours. Cardiac Enzymes:  Recent Labs Lab 08/22/17 0002 08/22/17 0531 08/22/17 1114  TROPONINI 0.38* 0.93* 1.04*   BNP (last 3 results) No results for input(s): PROBNP in the last 8760 hours. HbA1C:  Recent Labs  08/22/17 0002  HGBA1C 5.1   CBG:  Recent Labs Lab 08/23/17 1635 08/23/17 2228 08/24/17 0039 08/24/17 0748 08/24/17 1158  GLUCAP 128* 149* 109* 94 115*   Lipid Profile: No results for input(s): CHOL, HDL, LDLCALC, TRIG, CHOLHDL, LDLDIRECT in the last 72 hours. Thyroid Function Tests: No results for input(s): TSH, T4TOTAL, FREET4, T3FREE, THYROIDAB in the last 72 hours. Anemia Panel: No results for input(s): VITAMINB12, FOLATE, FERRITIN, TIBC, IRON, RETICCTPCT in the last 72 hours. Urine analysis:    Component Value Date/Time   COLORURINE YELLOW 06/19/2016 0936   APPEARANCEUR HAZY (A) 06/19/2016 0936   LABSPEC 1.014 06/19/2016 0936   PHURINE 6.5 06/19/2016 0936   GLUCOSEU NEGATIVE 06/19/2016 0936   HGBUR MODERATE (A) 06/19/2016 0936   BILIRUBINUR NEGATIVE 06/19/2016 0936   KETONESUR NEGATIVE 06/19/2016 0936   PROTEINUR 100 (A) 06/19/2016 0936   NITRITE NEGATIVE 06/19/2016 0936   LEUKOCYTESUR MODERATE (A) 06/19/2016 0936    ) Recent Results (from the past 240 hour(s))  Gastrointestinal Panel by PCR , Stool     Status: None   Collection Time: 08/22/17 10:34 AM  Result Value Ref Range Status   Campylobacter species NOT DETECTED NOT DETECTED Final   Plesimonas shigelloides NOT DETECTED NOT DETECTED Final   Salmonella species NOT DETECTED NOT DETECTED Final   Yersinia enterocolitica NOT DETECTED NOT DETECTED Final   Vibrio species NOT DETECTED NOT DETECTED Final   Vibrio cholerae NOT DETECTED NOT DETECTED Final   Enteroaggregative E coli (EAEC) NOT DETECTED NOT DETECTED Final   Enteropathogenic E coli (EPEC) NOT DETECTED NOT DETECTED Final   Enterotoxigenic E coli (ETEC) NOT DETECTED NOT DETECTED Final   Shiga  like toxin producing E coli (STEC) NOT DETECTED NOT DETECTED Final   Shigella/Enteroinvasive E coli (EIEC) NOT DETECTED NOT DETECTED Final   Cryptosporidium NOT DETECTED NOT DETECTED Final   Cyclospora cayetanensis NOT DETECTED NOT DETECTED Final   Entamoeba histolytica NOT DETECTED NOT DETECTED Final   Giardia lamblia NOT DETECTED NOT DETECTED Final   Adenovirus F40/41 NOT DETECTED NOT DETECTED Final   Astrovirus NOT DETECTED NOT DETECTED Final   Norovirus GI/GII NOT DETECTED NOT DETECTED Final   Rotavirus A NOT DETECTED NOT  DETECTED Final   Sapovirus (I, II, IV, and V) NOT DETECTED NOT DETECTED Final  C difficile quick scan w PCR reflex     Status: None   Collection Time: 08/22/17 10:34 AM  Result Value Ref Range Status   C Diff antigen NEGATIVE NEGATIVE Final   C Diff toxin NEGATIVE NEGATIVE Final   C Diff interpretation No C. difficile detected.  Final      Anti-infectives    None       Radiology Studies: No results found.      Scheduled Meds: . calcium carbonate  4 tablet Oral BID AC  . dorzolamide  1 drop Right Eye BID  . ferrous sulfate  325 mg Oral BID WC  . furosemide  80 mg Oral Daily  . insulin aspart  0-9 Units Subcutaneous TID WC  . isosorbide mononitrate  60 mg Oral Daily  . metoprolol tartrate  100 mg Oral BID  . sodium bicarbonate  650 mg Oral BID   Continuous Infusions:   LOS: 2 days    Time spent: 25 min    [place], DO [facility] Pager [phone]  If 7PM-7AM, please contact night-coverage [email or web address] Password [email or web address] 08/24/2017, 1:01 PM

## 2017-08-25 LAB — TYPE AND SCREEN
ABO/RH(D): A POS
Antibody Screen: NEGATIVE
UNIT DIVISION: 0
Unit division: 0
Unit division: 0

## 2017-08-25 LAB — BPAM RBC
BLOOD PRODUCT EXPIRATION DATE: 201811042359
Blood Product Expiration Date: 201811102359
Blood Product Expiration Date: 201811112359
ISSUE DATE / TIME: 201810290113
ISSUE DATE / TIME: 201810310941
ISSUE DATE / TIME: 201810311224
UNIT TYPE AND RH: 600
Unit Type and Rh: 6200
Unit Type and Rh: 6200

## 2017-08-25 LAB — GLUCOSE, CAPILLARY: GLUCOSE-CAPILLARY: 102 mg/dL — AB (ref 65–99)

## 2017-08-25 MED ORDER — ISOSORBIDE MONONITRATE ER 60 MG PO TB24: 60.0000 mg | ORAL_TABLET | Freq: Every day | ORAL | 0 refills | Status: DC

## 2017-08-25 NOTE — Progress Notes (Signed)
Discharge instructions reviewed with patient and daughter in law, questions answered, verbalized understanding.  Patient transported to main entrance of hospital via wheelchair to be taken home by daughter in law.

## 2017-08-25 NOTE — Discharge Summary (Signed)
Physician Discharge Summary  Matthew Mays UEA:[id number] DOB: 1941/02/16 DOA: 08/21/2017  PCP: Estanislado Emms, MD  Admit date: 08/21/2017 Discharge date: 08/25/2017   Recommendations for Outpatient Follow-Up:   1. CBC PRN with transfusion 2. Home health/palliative care 3. Not interested in HD/ colonoscopy/EGD/cath   Discharge Diagnosis:   Principal Problem:   Chest pain Active Problems:   End stage renal disease (HCC)   Hyperglycemia   CKD (chronic kidney disease), stage V (HCC)   Anemia   Palliative care by specialist   Advance care planning   Non-ST elevation (NSTEMI) myocardial infarction [facility])   Discharge disposition:  Home.  Discharge Condition: Improved.  Diet recommendation: Low sodium, heart healthy  Wound care: None.   History of Present Illness:    Matthew Mays  is a 76 y.o. 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     Pt states that the chest pain is all across his upper chest and began tonight while he was sitting in his lazy boy.  Went from the left side to the right side of his chest,  "dull",, no radiation.  Pt denies fever, chills, cough, palp, sob, n/v, heartburn.  Pt states the pain lasted for about 45 minutes,  Nothing appeared to make the pain better or worse. Typically taking 4 baby aspirin causes the pain to go away and when it didn't he decided to call EMS.    Prior notes states that he didn't want cardiac evaluation but he is open to talking to cardiology about his options, as well as seeing GI about positive FOBT and anemia    Hospital Course by Problem:   Chest pain Prior Echo 08/13/2017 Appreciate cards adding imdur -- patient has good BP so can titrate up dose -would not want heart cath  Anemia of chronic disease Transfusion ordered by ED -not interested in  GI eval (has seen [facility] in the past) -- colonoscopy/EGD -says procrit not  working-- defer to Dr. Florene Glen- have sent message- PRN transfusions? -will need close outpatient follow up  ESRD  Not interested in HD  Valvular heart disease (mild AS, mod AR, mild MR) Stable  Dm2 Carb mod diet  Diarrhea Intermittent- long standing -limits patient's ability to get to Dr. Thomasene Lot..    Medical Consultants:    None.   Discharge Exam:   Vitals:   08/25/17 0541 08/25/17 0937  BP: (!) 147/70 (!) 148/56  Pulse: 85 89  Resp: 18   Temp: 98.4 F (36.9 C)   SpO2: 98% 100%   Vitals:   08/24/17 1452 08/24/17 2017 08/25/17 0541 08/25/17 0937  BP: (!) 139/50 (!) 151/48 (!) 147/70 (!) 148/56  Pulse: 75 88 85 89  Resp: 18 18 18    Temp: 97.7 F (36.5 C) 98.3 F (36.8 C) 98.4 F (36.9 C)   TempSrc: Oral Oral Oral   SpO2: 100% 98% 98% 100%  Weight:   75.4 kg (166 lb 3.2 oz)   Height:        Gen:  NAD    The results of significant diagnostics from this hospitalization (including imaging, microbiology, ancillary and laboratory) are listed below for reference.     Procedures and Diagnostic Studies:   Dg Chest 2 View  Result Date: 08/21/2017 CLINICAL DATA:  Chest pain EXAM: CHEST  2 VIEW COMPARISON:  08/12/2017 FINDINGS: Hyper inflation. No large pleural effusion. Mild  hazy bibasilar atelectasis or small infiltrates. Chronic bronchitic changes. Stable cardiomediastinal silhouette with atherosclerosis. IMPRESSION: Hyperinflation with mild hazy bibasilar atelectasis versus small infiltrates. Electronically Signed   By: Donavan Foil M.D.   On: 08/21/2017 21:34     Labs:   Basic Metabolic Panel:  Recent Labs Lab 08/21/17 2030 08/22/17 0531 08/23/17 0854  NA 134* 136 139  K 4.3 4.5 4.9  CL 99* 102 107  CO2 19* 19* 19*  GLUCOSE 209* 87 111*  BUN 107* 107* 107*  CREATININE 7.50* 7.54* 7.52*  CALCIUM 9.1 8.8* 8.5*   GFR Estimated Creatinine Clearance: 8.6 mL/min (A) (by C-G formula based on SCr of 7.52 mg/dL (H)). Liver Function  Tests:  Recent Labs Lab 08/22/17 0531  AST 15  ALT 11*  ALKPHOS 59  BILITOT 0.9  PROT 6.4*  ALBUMIN 2.9*   No results for input(s): LIPASE, AMYLASE in the last 168 hours. No results for input(s): AMMONIA in the last 168 hours. Coagulation profile No results for input(s): INR, PROTIME in the last 168 hours.  CBC:  Recent Labs Lab 08/21/17 2030 08/22/17 0531 08/23/17 0854 08/24/17 0436 08/24/17 1629  WBC 8.9 7.8 7.8 7.6  --   HGB 7.3* 8.3* 8.0* 7.9* 9.6*  HCT 22.2* 24.9* 24.4* 23.8* 28.6*  MCV 96.1 94.3 96.1 96.0  --   PLT 163 156 154 155  --    Cardiac Enzymes:  Recent Labs Lab 08/22/17 0002 08/22/17 0531 08/22/17 1114  TROPONINI 0.38* 0.93* 1.04*   BNP: Invalid input(s): POCBNP CBG:  Recent Labs Lab 08/24/17 0748 08/24/17 1158 08/24/17 1640 08/24/17 2154 08/25/17 0733  GLUCAP 94 115* 115* 125* 102*   D-Dimer No results for input(s): DDIMER in the last 72 hours. Hgb A1c No results for input(s): HGBA1C in the last 72 hours. Lipid Profile No results for input(s): CHOL, HDL, LDLCALC, TRIG, CHOLHDL, LDLDIRECT in the last 72 hours. Thyroid function studies No results for input(s): TSH, T4TOTAL, T3FREE, THYROIDAB in the last 72 hours.  Invalid input(s): FREET3 Anemia work up No results for input(s): VITAMINB12, FOLATE, FERRITIN, TIBC, IRON, RETICCTPCT in the last 72 hours. Microbiology Recent Results (from the past 240 hour(s))  Gastrointestinal Panel by PCR , Stool     Status: None   Collection Time: 08/22/17 10:34 AM  Result Value Ref Range Status   Campylobacter species NOT DETECTED NOT DETECTED Final   Plesimonas shigelloides NOT DETECTED NOT DETECTED Final   Salmonella species NOT DETECTED NOT DETECTED Final   Yersinia enterocolitica NOT DETECTED NOT DETECTED Final   Vibrio species NOT DETECTED NOT DETECTED Final   Vibrio cholerae NOT DETECTED NOT DETECTED Final   Enteroaggregative E coli (EAEC) NOT DETECTED NOT DETECTED Final    Enteropathogenic E coli (EPEC) NOT DETECTED NOT DETECTED Final   Enterotoxigenic E coli (ETEC) NOT DETECTED NOT DETECTED Final   Shiga like toxin producing E coli (STEC) NOT DETECTED NOT DETECTED Final   Shigella/Enteroinvasive E coli (EIEC) NOT DETECTED NOT DETECTED Final   Cryptosporidium NOT DETECTED NOT DETECTED Final   Cyclospora cayetanensis NOT DETECTED NOT DETECTED Final   Entamoeba histolytica NOT DETECTED NOT DETECTED Final   Giardia lamblia NOT DETECTED NOT DETECTED Final   Adenovirus F40/41 NOT DETECTED NOT DETECTED Final   Astrovirus NOT DETECTED NOT DETECTED Final   Norovirus GI/GII NOT DETECTED NOT DETECTED Final   Rotavirus A NOT DETECTED NOT DETECTED Final   Sapovirus (I, II, IV, and V) NOT DETECTED NOT DETECTED Final  C difficile quick scan  w PCR reflex     Status: None   Collection Time: 08/22/17 10:34 AM  Result Value Ref Range Status   C Diff antigen NEGATIVE NEGATIVE Final   C Diff toxin NEGATIVE NEGATIVE Final   C Diff interpretation No C. difficile detected.  Final     Discharge Instructions:   Discharge Instructions    Diet - low sodium heart healthy    Complete by:  As directed    Diet Carb Modified    Complete by:  As directed    Discharge instructions    Complete by:  As directed    Home health   Increase activity slowly    Complete by:  As directed      Allergies as of 08/25/2017   No Known Allergies     Medication List    TAKE these medications   calcium carbonate 750 MG chewable tablet Commonly known as:  TUMS EX Chew 3 tablets by mouth 2 (two) times daily. LUNCH and SUPPER   dorzolamide 2 % ophthalmic solution Commonly known as:  TRUSOPT Place 1 drop into the right eye 2 (two) times daily.   furosemide 80 MG tablet Commonly known as:  LASIX Take 80 mg by mouth daily.   IRON SUPPLEMENT 325 (65 FE) MG tablet Generic drug:  ferrous sulfate Take 325 mg by mouth 2 (two) times daily with a meal. LUNCH and SUPPER   isosorbide  mononitrate 60 MG 24 hr tablet Commonly known as:  IMDUR Take 1 tablet (60 mg total) by mouth daily.   latanoprost 0.005 % ophthalmic solution Commonly known as:  XALATAN Place 1 drop into both eyes daily. 5 PM   metoprolol tartrate 100 MG tablet Commonly known as:  LOPRESSOR Take 100 mg by mouth 2 (two) times daily. LUNCH and BEDTIME   pantoprazole 40 MG tablet Commonly known as:  PROTONIX Take 1 tablet (40 mg total) by mouth daily.   sodium bicarbonate 650 MG tablet Take 650 mg by mouth 2 (two) times daily.      Follow-up Information    [facility] Follow up.   Specialty:  Home Health Services Why:  They will do your home health at your home Contact information: [place] [address] [place] [facility] Follow up.   Specialty:  Skilled Nursing Engineer, manufacturing information: [place]. [place] [address] [place] Follow up.   Specialty:  Hospice Services Why:  They will do your Outpatient Palliative Hospice care at your home Contact information: [place] [address] [phone]        Estanislado Emms, MD Follow up in 1 week(s).   Specialty:  Nephrology Why:  follow [facility] Contact information: [place] [address] [phone]            Time coordinating discharge: 35 min  Signed:  Maston Wight U Asharia Lotter   [facility] 08/25/2017, 10:47 AM

## 2017-08-25 NOTE — Progress Notes (Signed)
CSW spoke with patient at bedside to offer support and discuss disposition plan. Patient stated he does not want SNF but would rather go home with Home Health. RNCM has already made arrangement for patients home health needs. CSW signing off   Rhea Pink, MSW,  [place] [phone]

## 2017-08-27 DIAGNOSIS — H409 Unspecified glaucoma: Secondary | ICD-10-CM | POA: Diagnosis not present

## 2017-08-27 DIAGNOSIS — I080 Rheumatic disorders of both mitral and aortic valves: Secondary | ICD-10-CM | POA: Diagnosis not present

## 2017-08-27 DIAGNOSIS — N186 End stage renal disease: Secondary | ICD-10-CM | POA: Diagnosis not present

## 2017-08-27 DIAGNOSIS — D631 Anemia in chronic kidney disease: Secondary | ICD-10-CM | POA: Diagnosis not present

## 2017-08-27 DIAGNOSIS — E1122 Type 2 diabetes mellitus with diabetic chronic kidney disease: Secondary | ICD-10-CM | POA: Diagnosis not present

## 2017-08-27 DIAGNOSIS — I120 Hypertensive chronic kidney disease with stage 5 chronic kidney disease or end stage renal disease: Secondary | ICD-10-CM | POA: Diagnosis not present

## 2017-08-30 DIAGNOSIS — I080 Rheumatic disorders of both mitral and aortic valves: Secondary | ICD-10-CM | POA: Diagnosis not present

## 2017-08-30 DIAGNOSIS — D631 Anemia in chronic kidney disease: Secondary | ICD-10-CM | POA: Diagnosis not present

## 2017-08-30 DIAGNOSIS — I120 Hypertensive chronic kidney disease with stage 5 chronic kidney disease or end stage renal disease: Secondary | ICD-10-CM | POA: Diagnosis not present

## 2017-08-30 DIAGNOSIS — H409 Unspecified glaucoma: Secondary | ICD-10-CM | POA: Diagnosis not present

## 2017-08-30 DIAGNOSIS — E1122 Type 2 diabetes mellitus with diabetic chronic kidney disease: Secondary | ICD-10-CM | POA: Diagnosis not present

## 2017-08-30 DIAGNOSIS — N186 End stage renal disease: Secondary | ICD-10-CM | POA: Diagnosis not present

## 2017-09-01 DIAGNOSIS — H409 Unspecified glaucoma: Secondary | ICD-10-CM | POA: Diagnosis not present

## 2017-09-01 DIAGNOSIS — D631 Anemia in chronic kidney disease: Secondary | ICD-10-CM | POA: Diagnosis not present

## 2017-09-01 DIAGNOSIS — I120 Hypertensive chronic kidney disease with stage 5 chronic kidney disease or end stage renal disease: Secondary | ICD-10-CM | POA: Diagnosis not present

## 2017-09-01 DIAGNOSIS — I080 Rheumatic disorders of both mitral and aortic valves: Secondary | ICD-10-CM | POA: Diagnosis not present

## 2017-09-01 DIAGNOSIS — E1122 Type 2 diabetes mellitus with diabetic chronic kidney disease: Secondary | ICD-10-CM | POA: Diagnosis not present

## 2017-09-01 DIAGNOSIS — N186 End stage renal disease: Secondary | ICD-10-CM | POA: Diagnosis not present

## 2017-09-06 DIAGNOSIS — N186 End stage renal disease: Secondary | ICD-10-CM | POA: Diagnosis not present

## 2017-09-06 DIAGNOSIS — E1122 Type 2 diabetes mellitus with diabetic chronic kidney disease: Secondary | ICD-10-CM | POA: Diagnosis not present

## 2017-09-06 DIAGNOSIS — I120 Hypertensive chronic kidney disease with stage 5 chronic kidney disease or end stage renal disease: Secondary | ICD-10-CM | POA: Diagnosis not present

## 2017-09-06 DIAGNOSIS — H409 Unspecified glaucoma: Secondary | ICD-10-CM | POA: Diagnosis not present

## 2017-09-06 DIAGNOSIS — I080 Rheumatic disorders of both mitral and aortic valves: Secondary | ICD-10-CM | POA: Diagnosis not present

## 2017-09-06 DIAGNOSIS — D631 Anemia in chronic kidney disease: Secondary | ICD-10-CM | POA: Diagnosis not present

## 2017-09-07 ENCOUNTER — Ambulatory Visit: Payer: Medicare Other | Admitting: Cardiovascular Disease

## 2017-09-10 DIAGNOSIS — N186 End stage renal disease: Secondary | ICD-10-CM | POA: Diagnosis not present

## 2017-09-10 DIAGNOSIS — E1122 Type 2 diabetes mellitus with diabetic chronic kidney disease: Secondary | ICD-10-CM | POA: Diagnosis not present

## 2017-09-10 DIAGNOSIS — H409 Unspecified glaucoma: Secondary | ICD-10-CM | POA: Diagnosis not present

## 2017-09-10 DIAGNOSIS — I120 Hypertensive chronic kidney disease with stage 5 chronic kidney disease or end stage renal disease: Secondary | ICD-10-CM | POA: Diagnosis not present

## 2017-09-10 DIAGNOSIS — D631 Anemia in chronic kidney disease: Secondary | ICD-10-CM | POA: Diagnosis not present

## 2017-09-10 DIAGNOSIS — I080 Rheumatic disorders of both mitral and aortic valves: Secondary | ICD-10-CM | POA: Diagnosis not present

## 2017-09-12 DIAGNOSIS — N186 End stage renal disease: Secondary | ICD-10-CM | POA: Diagnosis not present

## 2017-09-12 DIAGNOSIS — E1122 Type 2 diabetes mellitus with diabetic chronic kidney disease: Secondary | ICD-10-CM | POA: Diagnosis not present

## 2017-09-12 DIAGNOSIS — I080 Rheumatic disorders of both mitral and aortic valves: Secondary | ICD-10-CM | POA: Diagnosis not present

## 2017-09-12 DIAGNOSIS — I120 Hypertensive chronic kidney disease with stage 5 chronic kidney disease or end stage renal disease: Secondary | ICD-10-CM | POA: Diagnosis not present

## 2017-09-12 DIAGNOSIS — D631 Anemia in chronic kidney disease: Secondary | ICD-10-CM | POA: Diagnosis not present

## 2017-09-12 DIAGNOSIS — H409 Unspecified glaucoma: Secondary | ICD-10-CM | POA: Diagnosis not present

## 2017-09-13 ENCOUNTER — Emergency Department (HOSPITAL_COMMUNITY)
Admission: EM | Admit: 2017-09-13 | Discharge: 2017-09-13 | Disposition: A | Discharge status: Home / Self Care | Payer: Medicare Other | Attending: Emergency Medicine | Admitting: Emergency Medicine

## 2017-09-13 ENCOUNTER — Encounter (HOSPITAL_COMMUNITY): Payer: Self-pay | Admitting: Emergency Medicine

## 2017-09-13 ENCOUNTER — Emergency Department (HOSPITAL_COMMUNITY): Payer: Medicare Other

## 2017-09-13 ENCOUNTER — Other Ambulatory Visit: Payer: Self-pay

## 2017-09-13 DIAGNOSIS — D591 Other autoimmune hemolytic anemias: Secondary | ICD-10-CM | POA: Diagnosis not present

## 2017-09-13 DIAGNOSIS — I252 Old myocardial infarction: Secondary | ICD-10-CM | POA: Insufficient documentation

## 2017-09-13 DIAGNOSIS — N186 End stage renal disease: Secondary | ICD-10-CM | POA: Insufficient documentation

## 2017-09-13 DIAGNOSIS — I120 Hypertensive chronic kidney disease with stage 5 chronic kidney disease or end stage renal disease: Secondary | ICD-10-CM | POA: Insufficient documentation

## 2017-09-13 DIAGNOSIS — Z8551 Personal history of malignant neoplasm of bladder: Secondary | ICD-10-CM | POA: Diagnosis not present

## 2017-09-13 DIAGNOSIS — R0789 Other chest pain: Secondary | ICD-10-CM | POA: Insufficient documentation

## 2017-09-13 DIAGNOSIS — Z87891 Personal history of nicotine dependence: Secondary | ICD-10-CM | POA: Insufficient documentation

## 2017-09-13 DIAGNOSIS — R079 Chest pain, unspecified: Secondary | ICD-10-CM | POA: Diagnosis not present

## 2017-09-13 DIAGNOSIS — Z79899 Other long term (current) drug therapy: Secondary | ICD-10-CM | POA: Insufficient documentation

## 2017-09-13 LAB — I-STAT TROPONIN, ED: Troponin i, poc: 0.02 ng/mL (ref 0.00–0.08)

## 2017-09-13 LAB — CBC
HCT: 29.2 % — ABNORMAL LOW (ref 39.0–52.0)
Hemoglobin: 9.7 g/dL — ABNORMAL LOW (ref 13.0–17.0)
MCH: 31 pg (ref 26.0–34.0)
MCHC: 33.2 g/dL (ref 30.0–36.0)
MCV: 93.3 fL (ref 78.0–100.0)
PLATELETS: 169 10*3/uL (ref 150–400)
RBC: 3.13 MIL/uL — ABNORMAL LOW (ref 4.22–5.81)
RDW: 16 % — AB (ref 11.5–15.5)
WBC: 9.8 10*3/uL (ref 4.0–10.5)

## 2017-09-13 LAB — BASIC METABOLIC PANEL
Anion gap: 15 (ref 5–15)
BUN: 121 mg/dL — AB (ref 6–20)
CHLORIDE: 103 mmol/L (ref 101–111)
CO2: 18 mmol/L — AB (ref 22–32)
CREATININE: 8.22 mg/dL — AB (ref 0.61–1.24)
Calcium: 9.2 mg/dL (ref 8.9–10.3)
GFR calc Af Amer: 6 mL/min — ABNORMAL LOW (ref >60)
GFR calc non Af Amer: 6 mL/min — ABNORMAL LOW (ref >60)
Glucose, Bld: 91 mg/dL (ref 65–99)
Potassium: 4.8 mmol/L (ref 3.5–5.1)
SODIUM: 136 mmol/L (ref 135–145)

## 2017-09-13 LAB — HEMOGLOBIN AND HEMATOCRIT, BLOOD
HCT: 28.1 % — ABNORMAL LOW (ref 39.0–52.0)
Hemoglobin: 9.4 g/dL — ABNORMAL LOW (ref 13.0–17.0)

## 2017-09-13 NOTE — ED Triage Notes (Signed)
Patient states that around 0300 he was awoken by chest pain. Took 324mg  ASA and called 911. Operator advised patient to take additional 324mg  so patient complied. Initially pain @ 5/10. EMS arrived to find patient with CP @ 4/10. Patient stood and got onto EMS stretcher with marked increase in respiratory effort per EMS. Afterward patient's chest pain resolved without intervention. On arrival here, patient chest pain free. History of 3 recent similar episodes. CKD present. Nephrostomy in right abdomen.

## 2017-09-13 NOTE — ED Provider Notes (Signed)
[facility] EMERGENCY DEPARTMENT Provider Note   CSN: [id number] Arrival date & time: 09/13/17  0435     History   Chief Complaint Chief Complaint  Patient presents with  . Chest Pain    HPI Matthew Mays is a 76 y.o. male.  HPI  This is a 76 year old male with a history of symptomatic anemia, chronic kidney disease, hypertension, NSTEMI who presents with chest pain.  Patient reports that he was sleeping when he was awoken with chest pain began to radiate across his chest.  He took 4 aspirin and waited 15 minutes.  It did not subside.  He called 911 and took 4 more aspirin.  Chest pain resolved after about 30 minutes.  He states this pain is similar to when he has had prior pain related to symptomatic anemia.  Denies any other symptoms including fevers, cough.  Past Medical History:  Diagnosis Date  . Anemia   . Aortic regurgitation 08/13/2017   moderate  . Aortic stenosis 08/13/2017   mild  . Bladder cancer ([place])   . Chronic kidney disease   . H/O sinus tachycardia   . Hypertension   . Mitral regurgitation 08/13/2017   mild  . Non-ST elevation (NSTEMI) myocardial infarction ([place])   . Prediabetes   . Secondary hyperparathyroidism [facility])     Patient Active Problem List   Diagnosis Date Noted  . Palliative care by specialist   . Advance care planning   . Non-ST elevation (NSTEMI) myocardial infarction ([place])   . Chest pain 08/12/2017  . Diarrhea 07/22/2017  . Hyponatremia 07/22/2017  . Hypokalemia 07/22/2017  . Elevated troponin 07/22/2017  . Chest discomfort 07/22/2017  . Hyperkalemia 07/22/2017  . Anemia 07/21/2017  . Glaucoma 07/21/2017  . UTI (lower urinary tract infection) 06/19/2016  . Sepsis ([place]) 06/19/2016  . Hyperglycemia 06/19/2016  . Hypertension 06/19/2016  . AKI (acute kidney injury) ([place]) 06/19/2016  . CKD (chronic kidney disease), stage V ([place]) 06/19/2016  . End stage renal disease ([place]) 03/22/2012    Past Surgical History:   Procedure Laterality Date  . ARTERIOVENOUS (AV) FISTULA CREATION Left 03/30/2012   Performed by Angelia Mould, MD at [place]  . ARTERIOVENOUS GRAFT PLACEMENT     left arm AVG  . CYSTECTOMY     w/ ileal diversion for bladder cancer   . TONSILLECTOMY         Home Medications    Prior to Admission medications   Medication Sig Start Date End Date Taking? Authorizing Provider  calcium carbonate (TUMS EX) 750 MG chewable tablet Chew 3 tablets by mouth 2 (two) times daily. LUNCH and SUPPER   Yes [provider]  dorzolamide (TRUSOPT) 2 % ophthalmic solution Place 1 drop into the right eye 2 (two) times daily.    Yes [provider]  ferrous sulfate (IRON SUPPLEMENT) 325 (65 FE) MG tablet Take 325 mg by mouth 2 (two) times daily with a meal. LUNCH and SUPPER   Yes [provider]  furosemide (LASIX) 80 MG tablet Take 80 mg by mouth daily.    Yes [provider]  isosorbide mononitrate (IMDUR) 60 MG 24 hr tablet Take 1 tablet (60 mg total) by mouth daily. 08/26/17  Yes Vann, Jessica U, DO  latanoprost (XALATAN) 0.005 % ophthalmic solution Place 1 drop into both eyes daily. 5 PM   Yes [provider]  metoprolol tartrate (LOPRESSOR) 100 MG tablet Take 100 mg by mouth 2 (two) times daily. LUNCH  and BEDTIME   Yes [provider]  pantoprazole (PROTONIX) 40 MG tablet Take 1 tablet (40 mg total) by mouth daily. 08/14/17  Yes Erline Hau, MD    Family History Family History  Problem Relation Age of Onset  . Cancer Mother        BRAIN    Social History Social History   Tobacco Use  . Smoking status: Former Smoker    Packs/day: 2.00    Years: 40.00    Pack years: 80.00    Types: Cigarettes, Pipe    Last attempt to quit: 03/22/2006    Years since quitting: 11.4  . Smokeless tobacco: Never Used  . Tobacco comment: Stopped smoking 10 years ago.  Substance Use Topics  . Alcohol use: Yes    Alcohol/week: 19.2 oz     Types: 7 Cans of beer, 25 Glasses of wine per week  . Drug use: No     Allergies   Patient has no known allergies.   Review of Systems Review of Systems  Constitutional: Negative for fatigue and fever.  Respiratory: Negative for shortness of breath.   Cardiovascular: Positive for chest pain.  Gastrointestinal: Negative for abdominal pain, diarrhea, nausea and vomiting.  Genitourinary: Negative for dysuria.  All other systems reviewed and are negative.    Physical Exam Updated Vital Signs BP (!) 152/67 (BP Location: Right Arm)   Pulse 92   Temp 97.7 F (36.5 C) (Oral)   Resp 19   Ht 5\' 10"  (1.778 m)   Wt 73.9 kg (163 lb)   SpO2 100%   BMI 23.39 kg/m   Physical Exam  Constitutional: He is oriented to person, place, and time.  Chronically ill-appearing, no acute distress  HENT:  Head: Normocephalic and atraumatic.  Cardiovascular: Normal rate, regular rhythm, normal heart sounds and normal pulses.  No murmur heard. Pulmonary/Chest: Effort normal and breath sounds normal. No respiratory distress. He has no wheezes.  Abdominal: Soft. Bowel sounds are normal. There is no tenderness. There is no rebound.  Musculoskeletal: He exhibits no edema.  Neurological: He is alert and oriented to person, place, and time.  Skin: Skin is warm and dry.  Psychiatric: He has a normal mood and affect.  Nursing note and vitals reviewed.    ED Treatments / Results  Labs (all labs ordered are listed, but only abnormal results are displayed) Labs Reviewed  BASIC METABOLIC PANEL - Abnormal; Notable for the following components:      Result Value   CO2 18 (*)    BUN 121 (*)    Creatinine, Ser 8.22 (*)    GFR calc non Af Amer 6 (*)    GFR calc Af Amer 6 (*)    All other components within normal limits  CBC - Abnormal; Notable for the following components:   RBC 3.13 (*)    Hemoglobin 9.7 (*)    HCT 29.2 (*)    RDW 16.0 (*)    All other components within normal limits  HEMOGLOBIN  AND HEMATOCRIT, BLOOD - Abnormal; Notable for the following components:   Hemoglobin 9.4 (*)    HCT 28.1 (*)    All other components within normal limits  I-STAT TROPONIN, ED    EKG  EKG Interpretation  Date/Time:  Tuesday September 13 2017 04:31:49 EST Ventricular Rate:  90 PR Interval:  162 QRS Duration: 138 QT Interval:  426 QTC Calculation: 521 R Axis:   -23 Text Interpretation:  Normal sinus rhythm Right  bundle branch block Abnormal ECG No significant change since last tracing Confirmed by Thayer Jew [phone]) on 09/13/2017 5:18:09 AM Also confirmed by Thayer Jew [phone]), editor Philomena Doheny [phone])  on 09/13/2017 7:25:42 AM       Radiology Dg Chest 2 View  Result Date: 09/13/2017 CLINICAL DATA:  Chest pain woke patient up. EXAM: CHEST  2 VIEW COMPARISON:  08/21/2017 FINDINGS: Heart size and pulmonary vascularity are normal. Mild hyperinflation suggesting emphysematous change. Slight fibrosis in the lung bases. No airspace disease or consolidation. No blunting of costophrenic angles. No pneumothorax. Calcified aorta. Degenerative changes in the spine. 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 No evidence of active pulmonary disease. Aortic atherosclerosis. Electronically Signed   By: Lucienne Capers M.D.   On: 09/13/2017 05:22    Procedures Procedures (including critical care time)  Medications Ordered in ED Medications - No data to display   Initial Impression / Assessment and Plan / ED Course  I have reviewed the triage vital signs and the nursing notes.  Pertinent labs & imaging results that were available during my care of the patient were reviewed by me and considered in my medical decision making (see chart for details).     Patient presents with chest pain.  Reports similar symptoms with symptomatic anemia.  He has had several evaluations for the same requiring transfusion.  Has previously declined any extensive  cardiac testing and endorses the same to me today.  EKG is nonischemic.  Troponin is negative.  Hemoglobin is actually stable at 9.7.  Patient does not believe this result.  It was repeated and confirmed.  Patient was treated I again confirmed with the patient that he does not want any further cardiac workup.  Do not feel a repeat troponin is indicated.  After history, exam, and medical workup I feel the patient has been appropriately medically screened and is safe for discharge home. Pertinent diagnoses were discussed with the patient. Patient was given return precautions.   Final Clinical Impressions(s) / ED Diagnoses   Final diagnoses:  Atypical chest pain    ED Discharge Orders    None       Merryl Hacker, MD 09/13/17 [phone]

## 2017-09-15 DIAGNOSIS — I120 Hypertensive chronic kidney disease with stage 5 chronic kidney disease or end stage renal disease: Secondary | ICD-10-CM | POA: Diagnosis not present

## 2017-09-15 DIAGNOSIS — D631 Anemia in chronic kidney disease: Secondary | ICD-10-CM | POA: Diagnosis not present

## 2017-09-15 DIAGNOSIS — N186 End stage renal disease: Secondary | ICD-10-CM | POA: Diagnosis not present

## 2017-09-15 DIAGNOSIS — E1122 Type 2 diabetes mellitus with diabetic chronic kidney disease: Secondary | ICD-10-CM | POA: Diagnosis not present

## 2017-09-15 DIAGNOSIS — I080 Rheumatic disorders of both mitral and aortic valves: Secondary | ICD-10-CM | POA: Diagnosis not present

## 2017-09-15 DIAGNOSIS — H409 Unspecified glaucoma: Secondary | ICD-10-CM | POA: Diagnosis not present

## 2017-09-19 DIAGNOSIS — I080 Rheumatic disorders of both mitral and aortic valves: Secondary | ICD-10-CM | POA: Diagnosis not present

## 2017-09-19 DIAGNOSIS — E1122 Type 2 diabetes mellitus with diabetic chronic kidney disease: Secondary | ICD-10-CM | POA: Diagnosis not present

## 2017-09-19 DIAGNOSIS — D631 Anemia in chronic kidney disease: Secondary | ICD-10-CM | POA: Diagnosis not present

## 2017-09-19 DIAGNOSIS — N186 End stage renal disease: Secondary | ICD-10-CM | POA: Diagnosis not present

## 2017-09-19 DIAGNOSIS — I120 Hypertensive chronic kidney disease with stage 5 chronic kidney disease or end stage renal disease: Secondary | ICD-10-CM | POA: Diagnosis not present

## 2017-09-19 DIAGNOSIS — H409 Unspecified glaucoma: Secondary | ICD-10-CM | POA: Diagnosis not present

## 2017-09-20 ENCOUNTER — Encounter (HOSPITAL_COMMUNITY): Payer: Self-pay | Admitting: Emergency Medicine

## 2017-09-20 ENCOUNTER — Other Ambulatory Visit: Payer: Self-pay

## 2017-09-20 ENCOUNTER — Emergency Department (HOSPITAL_COMMUNITY): Payer: Medicare Other

## 2017-09-20 ENCOUNTER — Observation Stay (HOSPITAL_COMMUNITY)
Admission: EM | Admit: 2017-09-20 | Discharge: 2017-09-21 | Disposition: A | Discharge status: Home / Self Care | Payer: Medicare Other | Attending: Internal Medicine | Admitting: Internal Medicine

## 2017-09-20 DIAGNOSIS — E871 Hypo-osmolality and hyponatremia: Secondary | ICD-10-CM | POA: Diagnosis not present

## 2017-09-20 DIAGNOSIS — R05 Cough: Secondary | ICD-10-CM | POA: Diagnosis not present

## 2017-09-20 DIAGNOSIS — D631 Anemia in chronic kidney disease: Secondary | ICD-10-CM

## 2017-09-20 DIAGNOSIS — R079 Chest pain, unspecified: Secondary | ICD-10-CM | POA: Diagnosis not present

## 2017-09-20 DIAGNOSIS — D649 Anemia, unspecified: Secondary | ICD-10-CM | POA: Diagnosis present

## 2017-09-20 DIAGNOSIS — N186 End stage renal disease: Secondary | ICD-10-CM | POA: Diagnosis present

## 2017-09-20 DIAGNOSIS — I252 Old myocardial infarction: Secondary | ICD-10-CM | POA: Insufficient documentation

## 2017-09-20 DIAGNOSIS — Z66 Do not resuscitate: Secondary | ICD-10-CM | POA: Diagnosis not present

## 2017-09-20 DIAGNOSIS — I120 Hypertensive chronic kidney disease with stage 5 chronic kidney disease or end stage renal disease: Secondary | ICD-10-CM | POA: Diagnosis not present

## 2017-09-20 DIAGNOSIS — I4510 Unspecified right bundle-branch block: Secondary | ICD-10-CM | POA: Diagnosis not present

## 2017-09-20 DIAGNOSIS — J449 Chronic obstructive pulmonary disease, unspecified: Secondary | ICD-10-CM | POA: Diagnosis not present

## 2017-09-20 DIAGNOSIS — R7989 Other specified abnormal findings of blood chemistry: Secondary | ICD-10-CM

## 2017-09-20 DIAGNOSIS — Z515 Encounter for palliative care: Secondary | ICD-10-CM

## 2017-09-20 DIAGNOSIS — Z79899 Other long term (current) drug therapy: Secondary | ICD-10-CM | POA: Diagnosis not present

## 2017-09-20 DIAGNOSIS — Z8551 Personal history of malignant neoplasm of bladder: Secondary | ICD-10-CM | POA: Diagnosis not present

## 2017-09-20 DIAGNOSIS — H409 Unspecified glaucoma: Secondary | ICD-10-CM | POA: Diagnosis not present

## 2017-09-20 DIAGNOSIS — N189 Chronic kidney disease, unspecified: Secondary | ICD-10-CM

## 2017-09-20 DIAGNOSIS — K529 Noninfective gastroenteritis and colitis, unspecified: Secondary | ICD-10-CM | POA: Diagnosis not present

## 2017-09-20 DIAGNOSIS — I2510 Atherosclerotic heart disease of native coronary artery without angina pectoris: Secondary | ICD-10-CM | POA: Insufficient documentation

## 2017-09-20 DIAGNOSIS — R7303 Prediabetes: Secondary | ICD-10-CM | POA: Diagnosis not present

## 2017-09-20 DIAGNOSIS — N184 Chronic kidney disease, stage 4 (severe): Secondary | ICD-10-CM

## 2017-09-20 DIAGNOSIS — I214 Non-ST elevation (NSTEMI) myocardial infarction: Secondary | ICD-10-CM

## 2017-09-20 DIAGNOSIS — D638 Anemia in other chronic diseases classified elsewhere: Secondary | ICD-10-CM | POA: Diagnosis not present

## 2017-09-20 DIAGNOSIS — R0789 Other chest pain: Secondary | ICD-10-CM | POA: Diagnosis not present

## 2017-09-20 DIAGNOSIS — R739 Hyperglycemia, unspecified: Secondary | ICD-10-CM | POA: Diagnosis not present

## 2017-09-20 DIAGNOSIS — R778 Other specified abnormalities of plasma proteins: Secondary | ICD-10-CM

## 2017-09-20 DIAGNOSIS — E213 Hyperparathyroidism, unspecified: Secondary | ICD-10-CM | POA: Insufficient documentation

## 2017-09-20 DIAGNOSIS — Z87891 Personal history of nicotine dependence: Secondary | ICD-10-CM | POA: Insufficient documentation

## 2017-09-20 DIAGNOSIS — Z9981 Dependence on supplemental oxygen: Secondary | ICD-10-CM | POA: Diagnosis not present

## 2017-09-20 DIAGNOSIS — N185 Chronic kidney disease, stage 5: Secondary | ICD-10-CM | POA: Diagnosis not present

## 2017-09-20 LAB — CBC
HCT: 26.1 % — ABNORMAL LOW (ref 39.0–52.0)
HEMOGLOBIN: 8.9 g/dL — AB (ref 13.0–17.0)
MCH: 32 pg (ref 26.0–34.0)
MCHC: 34.1 g/dL (ref 30.0–36.0)
MCV: 93.9 fL (ref 78.0–100.0)
Platelets: 183 10*3/uL (ref 150–400)
RBC: 2.78 MIL/uL — AB (ref 4.22–5.81)
RDW: 16 % — ABNORMAL HIGH (ref 11.5–15.5)
WBC: 10.4 10*3/uL (ref 4.0–10.5)

## 2017-09-20 LAB — URINALYSIS, ROUTINE W REFLEX MICROSCOPIC
Bilirubin Urine: NEGATIVE
GLUCOSE, UA: NEGATIVE mg/dL
Ketones, ur: NEGATIVE mg/dL
NITRITE: NEGATIVE
PH: 7 (ref 5.0–8.0)
Protein, ur: 100 mg/dL — AB
SPECIFIC GRAVITY, URINE: 1.009 (ref 1.005–1.030)

## 2017-09-20 LAB — I-STAT CHEM 8, ED
BUN: 122 mg/dL — AB (ref 6–20)
CALCIUM ION: 1.09 mmol/L — AB (ref 1.15–1.40)
Chloride: 101 mmol/L (ref 101–111)
Creatinine, Ser: 8.6 mg/dL — ABNORMAL HIGH (ref 0.61–1.24)
GLUCOSE: 193 mg/dL — AB (ref 65–99)
HCT: 23 % — ABNORMAL LOW (ref 39.0–52.0)
Hemoglobin: 7.8 g/dL — ABNORMAL LOW (ref 13.0–17.0)
Potassium: 4.7 mmol/L (ref 3.5–5.1)
Sodium: 132 mmol/L — ABNORMAL LOW (ref 135–145)
TCO2: 18 mmol/L — ABNORMAL LOW (ref 22–32)

## 2017-09-20 LAB — I-STAT TROPONIN, ED: Troponin i, poc: 0.6 ng/mL (ref 0.00–0.08)

## 2017-09-20 LAB — HEMOGLOBIN A1C
HEMOGLOBIN A1C: 5.7 % — AB (ref 4.8–5.6)
MEAN PLASMA GLUCOSE: 116.89 mg/dL

## 2017-09-20 LAB — TROPONIN I
Troponin I: 0.86 ng/mL (ref <0.03)
Troponin I: 0.97 ng/mL (ref <0.03)

## 2017-09-20 LAB — CBG MONITORING, ED: Glucose-Capillary: 97 mg/dL (ref 65–99)

## 2017-09-20 LAB — TSH: TSH: 4.177 u[IU]/mL (ref 0.350–4.500)

## 2017-09-20 LAB — PREPARE RBC (CROSSMATCH)

## 2017-09-20 MED ORDER — HYDROCODONE-ACETAMINOPHEN 5-325 MG PO TABS: 1.0000 | ORAL_TABLET | ORAL | Status: DC | PRN

## 2017-09-20 MED ORDER — FUROSEMIDE 20 MG PO TABS: 20.0000 mg | ORAL_TABLET | Freq: Once | ORAL | Status: DC

## 2017-09-20 MED ORDER — MORPHINE SULFATE (PF) 4 MG/ML IV SOLN: 1.0000 mg | INTRAVENOUS | Status: DC | PRN

## 2017-09-20 MED ORDER — FERROUS SULFATE 325 (65 FE) MG PO TABS
325.0000 mg | ORAL_TABLET | Freq: Two times a day (BID) | ORAL | Status: DC
  Administered 2017-09-21 (×2): 325 mg via ORAL
  Filled 2017-09-20 (×4): qty 1

## 2017-09-20 MED ORDER — ENSURE ENLIVE PO LIQD
1.0000 | Freq: Every day | ORAL | Status: DC
  Filled 2017-09-20: qty 237

## 2017-09-20 MED ORDER — SENNOSIDES-DOCUSATE SODIUM 8.6-50 MG PO TABS: 1.0000 | ORAL_TABLET | Freq: Every evening | ORAL | Status: DC | PRN

## 2017-09-20 MED ORDER — SODIUM CHLORIDE 0.9 % IV SOLN
INTRAVENOUS | Status: DC
  Administered 2017-09-21: 02:00:00 via INTRAVENOUS

## 2017-09-20 MED ORDER — ONDANSETRON HCL 4 MG PO TABS: 4.0000 mg | ORAL_TABLET | Freq: Four times a day (QID) | ORAL | Status: DC | PRN

## 2017-09-20 MED ORDER — HEPARIN SODIUM (PORCINE) 5000 UNIT/ML IJ SOLN
5000.0000 [IU] | Freq: Three times a day (TID) | INTRAMUSCULAR | Status: DC
  Administered 2017-09-20 – 2017-09-21 (×2): 5000 [IU] via SUBCUTANEOUS
  Filled 2017-09-20 (×2): qty 1

## 2017-09-20 MED ORDER — CALCIUM CARBONATE ANTACID 500 MG PO CHEW
3.0000 | CHEWABLE_TABLET | Freq: Two times a day (BID) | ORAL | Status: DC
  Administered 2017-09-21: 600 mg via ORAL
  Filled 2017-09-20 (×3): qty 3

## 2017-09-20 MED ORDER — BISACODYL 10 MG RE SUPP: 10.0000 mg | Freq: Every day | RECTAL | Status: DC | PRN

## 2017-09-20 MED ORDER — DORZOLAMIDE HCL 2 % OP SOLN
1.0000 [drp] | Freq: Two times a day (BID) | OPHTHALMIC | Status: DC
  Administered 2017-09-21: 1 [drp] via OPHTHALMIC
  Filled 2017-09-20: qty 10

## 2017-09-20 MED ORDER — ALPRAZOLAM 0.5 MG PO TABS: 0.2500 mg | ORAL_TABLET | Freq: Two times a day (BID) | ORAL | Status: DC | PRN

## 2017-09-20 MED ORDER — METOPROLOL TARTRATE 25 MG PO TABS
100.0000 mg | ORAL_TABLET | Freq: Two times a day (BID) | ORAL | Status: DC
  Administered 2017-09-20 – 2017-09-21 (×2): 100 mg via ORAL
  Filled 2017-09-20 (×2): qty 4

## 2017-09-20 MED ORDER — FUROSEMIDE 20 MG PO TABS
80.0000 mg | ORAL_TABLET | Freq: Every day | ORAL | Status: DC
  Administered 2017-09-21: 80 mg via ORAL
  Filled 2017-09-20: qty 4

## 2017-09-20 MED ORDER — ISOSORBIDE MONONITRATE ER 30 MG PO TB24
60.0000 mg | ORAL_TABLET | Freq: Every day | ORAL | Status: DC
  Administered 2017-09-21: 60 mg via ORAL
  Filled 2017-09-20: qty 2

## 2017-09-20 MED ORDER — LATANOPROST 0.005 % OP SOLN
1.0000 [drp] | Freq: Every day | OPHTHALMIC | Status: DC
  Administered 2017-09-21 (×2): 1 [drp] via OPHTHALMIC
  Filled 2017-09-20: qty 2.5

## 2017-09-20 MED ORDER — ONDANSETRON HCL 4 MG/2ML IJ SOLN: 4.0000 mg | Freq: Four times a day (QID) | INTRAMUSCULAR | Status: DC | PRN

## 2017-09-20 MED ORDER — SODIUM CHLORIDE 0.9 % IV SOLN
Freq: Once | INTRAVENOUS | Status: AC
  Administered 2017-09-20: 21:00:00 via INTRAVENOUS

## 2017-09-20 NOTE — ED Triage Notes (Signed)
Pt arrives via EMS from home with substernal CP beginning this morning. Pt stated last time had CP hbg was low and needed transfusion. STEMI cancelled due to RBBB similar EKG to prior. 324mg  ASA PTA. CBG 253. +cough. Denies recent fever.

## 2017-09-20 NOTE — H&P (Signed)
History and Physical    Matthew Mays KWI:[id number] DOB: [birth year] DOA: 09/20/2017   PCP: Matthew Emms, MD   Patient coming from:  Home    Chief Complaint: Chest pain   HPI: Matthew Mays is a 76 y.o. male with medical history significant for CKD stage V, COPD on home O2, anemia of chronic disease, history of sinus tachycardia, hypertension, history of mitral regurgitation and aortic regurgitation, history of an STEMI, prediabetes, secondary hyperparathyroidism, history of bladder cancer on permanent cath, resenting with chest pain, and tightness, with pressure across his chest, starting around 930 this morning, resolved after taking 4 baby aspirin.  Around noon, the patient had similar episode took another 4 baby aspirins, the pain did not resolve, which he called EMS.  On arrival, he did not have any symptoms.  He denies any increasing shortness of breath.  He denies any nausea or diaphoresis.  He denies any radiation to the jaw or to the left arm.  He reports that this pain is similar to that of symptomatic anemia, when he required a blood transfusion for anemia, at which time the symptoms have resolved.  He denies any bleeding issues.  He denies any cough, or recent infection.  The pain.  He denies any dysuria or hematuria.  He denies any lower extremity swelling.  He is compliant with his medications, denies taking significant amount of NSAIDs other than aspirin daily.  ED Course:  BP (!) 141/55   Pulse 95   Resp (!) 24   SpO2 100%    EKG right bundle branch block, sinus tach  similar to prior Received 324 mg of aspirin CBG 253 Sodium 132 Creatinine 8.6 Hemoglobin 8.9 with repeat 7.8   Troponin 0.6, but this being trended Chest x-ray negative for acute pulmonary disease.   Review of Systems:  As per HPI otherwise all other systems reviewed and are negative  Past Medical History:  Diagnosis Date  . Anemia   . Aortic regurgitation 08/13/2017   moderate  . Aortic stenosis  08/13/2017   mild  . Bladder cancer ([place])   . Chronic kidney disease   . H/O sinus tachycardia   . Hypertension   . Mitral regurgitation 08/13/2017   mild  . Non-ST elevation (NSTEMI) myocardial infarction ([place])   . Prediabetes   . Secondary hyperparathyroidism [facility])     Past Surgical History:  Procedure Laterality Date  . ARTERIOVENOUS GRAFT PLACEMENT     left arm AVG  . AV FISTULA PLACEMENT  03/30/2012   Procedure: ARTERIOVENOUS (AV) FISTULA CREATION;  Surgeon: Angelia Mould, MD;  Location: [facility];  Service: Vascular;  Laterality: Left;  . CYSTECTOMY     w/ ileal diversion for bladder cancer   . TONSILLECTOMY      Social History Social History   Socioeconomic History  . Marital status: Married    Spouse name: Not on file  . Number of children: Not on file  . Years of education: Not on file  . Highest education level: Not on file  Social Needs  . Financial resource strain: Not on file  . Food insecurity - worry: Not on file  . Food insecurity - inability: Not on file  . Transportation needs - medical: Not on file  . Transportation needs - non-medical: Not on file  Occupational History  . Not on file  Tobacco Use  . Smoking status: Former Smoker    Packs/day: 2.00    Years: 40.00  Pack years: 80.00    Types: Cigarettes, Pipe    Last attempt to quit: 03/22/2006    Years since quitting: 11.5  . Smokeless tobacco: Never Used  . Tobacco comment: Stopped smoking 10 years ago.  Substance and Sexual Activity  . Alcohol use: Yes    Alcohol/week: 19.2 oz    Types: 7 Cans of beer, 25 Glasses of wine per week  . Drug use: No  . Sexual activity: Not on file  Other Topics Concern  . Not on file  Social History Narrative  . Not on file     No Known Allergies  Family History  Problem Relation Age of Onset  . Cancer Mother        BRAIN      Prior to Admission medications   Medication Sig Start Date End Date Taking? Authorizing Provider  calcium  carbonate (TUMS EX) 750 MG chewable tablet Chew 3 tablets by mouth 2 (two) times daily. LUNCH and SUPPER   Yes [provider]  dorzolamide (TRUSOPT) 2 % ophthalmic solution Place 1 drop into the right eye 2 (two) times daily.    Yes [provider]  ENSURE PLUS (ENSURE PLUS) LIQD Take 1 Can by mouth daily.   Yes [provider]  ferrous sulfate (IRON SUPPLEMENT) 325 (65 FE) MG tablet Take 325 mg by mouth 2 (two) times daily with a meal. LUNCH and SUPPER   Yes [provider]  furosemide (LASIX) 80 MG tablet Take 80 mg by mouth daily.    Yes [provider]  isosorbide mononitrate (IMDUR) 60 MG 24 hr tablet Take 1 tablet (60 mg total) by mouth daily. 08/26/17  Yes Vann, Jessica U, DO  latanoprost (XALATAN) 0.005 % ophthalmic solution Place 1 drop into both eyes daily. 5 PM   Yes [provider]  metoprolol tartrate (LOPRESSOR) 100 MG tablet Take 100 mg by mouth 2 (two) times daily. LUNCH and BEDTIME   Yes [provider]  pantoprazole (PROTONIX) 40 MG tablet Take 1 tablet (40 mg total) by mouth daily. Patient not taking: Reported on 09/20/2017 08/14/17   Isaac Bliss, Rayford Halsted, MD    Physical Exam:  Vitals:   09/20/17 1345 09/20/17 1415 09/20/17 1430 09/20/17 1445  BP: (!) 153/55  (!) 124/57 (!) 141/55  Pulse:  96  95  Resp:  (!) 21  (!) 24  SpO2:  100%  100%   Constitutional: NAD, calm, chronically ill appearing, weak  Eyes: PERRL, lids and conjunctivae normal ENMT: Mucous membranes are moist, without exudate or lesions  Neck: normal, supple, no masses, no thyromegaly Respiratory: clear to auscultation bilaterally, no wheezing, no crackles. Normal respiratory effort  Cardiovascular: Regular rate and rhythm, 2/6 murmur, rubs or gallops. No extremity edema. 2+ pedal pulses. No carotid bruits.  Abdomen: Soft, non tender, No hepatosplenomegaly. Bowel sounds positive.  Musculoskeletal: no clubbing / cyanosis. Moves all  extremities Skin: no jaundice, No lesions.  Neurologic: Sensation intact  Strength equal in all extremities Psychiatric:   Alert and oriented x 3. Normal mood.     Labs on Admission: I have personally reviewed following labs and imaging studies  CBC: Recent Labs  Lab 09/20/17 1335 09/20/17 1358  WBC 10.4  --   HGB 8.9* 7.8*  HCT 26.1* 23.0*  MCV 93.9  --   PLT 183  --     Basic Metabolic Panel: Recent Labs  Lab 09/20/17 1358  NA 132*  K 4.7  CL 101  GLUCOSE  193*  BUN 122*  CREATININE 8.60*    GFR: Estimated Creatinine Clearance: 7.5 mL/min (A) (by C-G formula based on SCr of 8.6 mg/dL (H)).  Liver Function Tests: No results for input(s): AST, ALT, ALKPHOS, BILITOT, PROT, ALBUMIN in the last 168 hours. No results for input(s): LIPASE, AMYLASE in the last 168 hours. No results for input(s): AMMONIA in the last 168 hours.  Coagulation Profile: No results for input(s): INR, PROTIME in the last 168 hours.  Cardiac Enzymes: No results for input(s): CKTOTAL, CKMB, CKMBINDEX, TROPONINI in the last 168 hours.  BNP (last 3 results) No results for input(s): PROBNP in the last 8760 hours.  HbA1C: No results for input(s): HGBA1C in the last 72 hours.  CBG: No results for input(s): GLUCAP in the last 168 hours.  Lipid Profile: No results for input(s): CHOL, HDL, LDLCALC, TRIG, CHOLHDL, LDLDIRECT in the last 72 hours.  Thyroid Function Tests: No results for input(s): TSH, T4TOTAL, FREET4, T3FREE, THYROIDAB in the last 72 hours.  Anemia Panel: No results for input(s): VITAMINB12, FOLATE, FERRITIN, TIBC, IRON, RETICCTPCT in the last 72 hours.  Urine analysis:    Component Value Date/Time   COLORURINE YELLOW 06/19/2016 0936   APPEARANCEUR HAZY (A) 06/19/2016 0936   LABSPEC 1.014 06/19/2016 0936   PHURINE 6.5 06/19/2016 0936   GLUCOSEU NEGATIVE 06/19/2016 0936   HGBUR MODERATE (A) 06/19/2016 0936   BILIRUBINUR NEGATIVE 06/19/2016 0936   KETONESUR NEGATIVE  06/19/2016 0936   PROTEINUR 100 (A) 06/19/2016 0936   NITRITE NEGATIVE 06/19/2016 0936   LEUKOCYTESUR MODERATE (A) 06/19/2016 0936    Sepsis Labs: _0 (procalcitonin:4,lacticidven:4) )No results found for this or any previous visit (from the past 240 hour(s)).   Radiological Exams on Admission: Dg Chest 2 View  Result Date: 09/20/2017 CLINICAL DATA:  Acute chest pain and cough today. EXAM: CHEST  2 VIEW COMPARISON:  09/13/2017 and prior radiographs FINDINGS: The cardiomediastinal silhouette is unremarkable. COPD changes again identified. There is no evidence of focal airspace disease, pulmonary edema, suspicious pulmonary nodule/mass, pleural effusion, or pneumothorax. No acute bony abnormalities are identified. Heavy vascular calcifications are again noted. IMPRESSION: COPD without evidence of acute cardiopulmonary disease. Electronically Signed   By: Margarette Canada M.D.   On: 09/20/2017 14:56    EKG: Independently reviewed.  Assessment/Plan Active Problems:   Chest pain   End stage renal disease (HCC)   Hyperglycemia   Hypertension   Anemia   Glaucoma   Hyponatremia   Palliative care by specialist   Non-ST elevation (NSTEMI) myocardial infarction [facility])   Symptomatic anemia  Chest pain syndrome/known CAD heart score 425, troponin 0.6, EKG without evidence of ACS.  History of NSTEMI Of note, the patient had been seen on 09/13/2017 with similar symptoms, and it was felt to be due to symptomatic anemia (see below) .  Today, there are non- specific EKG changes, although this may be due to oxygen demand.  he does take aspirin at home.  The patient does not wish to have at this time a cardiology evaluation, or aggressive measures, he is DNR  Will admit for telemetry observation Chest pain order set Cycle troponins Serial EKG continue ASA, O2 and NTG as needed Continue preadmission beta blocker and nitrate   Anemia of chronic disease, symptomatic anemia hemoglobin on admission  is 8.6, with repeat 7.8    Baseline 8-9 .  No apparent bleeding issues are noted. HAs received transfusions in the past with improvement of symptoms  Hcult   We will proceed  with 2  unit of blood, as the patient is symptomatic, and has also changes in his EKG and troponins as mentioned above, which may be due to demand  Check CBC in a.m. Continue oral supplement   ESRD, patient is not on dialysis, nor does he wish to be in HD.  However, his creatinine is more elevated than prior.  Baseline is between 7 and 8, today 8.6, Dr. Florene Glen is his main nephrologist.  He is still making urine He agreed to have nephrology consultation to help in the management of his worsening creatinine Other plans as per Nephrology Check CMET in am   Hypertension BP 43/56   Pulse 88  Continue home anti-hypertensive medications    Hyponatremia, currently 132,  BL 136 likely due to diuresis and poor oral intake  Will hold Lasix today, can resume in the morning Very gentle IV fluids We will recheck C met in a.m.   Prediabetes, glucose is today 193, Check CBG twice daily If continues to rise, will place patient on insulin sliding scale.  COPD without exacerbation [place]  Normal in 2 L  CXR NAD   WBC  Normal  Continue nebs and O2    DVT prophylaxis: Heparin Code Status:    DNR Family Communication:  Discussed with patient Disposition Plan: Expect patient to be discharged to home after condition improves Consults called:    Nephrology per  EDP Admission status: Telemetry observation   Sharene Butters, PA-C [facility]   09/20/2017, 4:32 PM

## 2017-09-20 NOTE — ED Notes (Signed)
Messaged pharmacy to verify medications 

## 2017-09-20 NOTE — Progress Notes (Signed)
I know [name] very well. He continues to lose weight.  He needs to start dialysis.  He refuses. He has a number of issues such as chronic diarrhea, which limit his ability to do things-per his report.  He reports he could not sit for a dialysis without frequent diarrhea. If that is true he would benefit from a GI work up.   He drinks wine daily but there is no hx of pancreatitis, but wonder if exocrine insufficiency may be present. He is obsessed about his hemoglobin.   He has two failed LUE AV accesses for dialysis.  Matthew Mays

## 2017-09-20 NOTE — Care Management Obs Status (Signed)
[facility] NOTIFICATION   Patient Details  Name: Matthew Mays MRN: [id number] Date of Birth: 04/27/1941   Medicare Observation Status Notification Given:  Yes    Apolonio Schneiders, RN 09/20/2017, 6:06 PM

## 2017-09-20 NOTE — ED Provider Notes (Signed)
[facility] EMERGENCY DEPARTMENT Provider Note   CSN: [id number] Arrival date & time: 09/20/17  1331     History   Chief Complaint Chief Complaint  Patient presents with  . Chest Pain    HPI Matthew Mays is a 75 y.o. male.  HPI  Patient with history of renal failure, symptomatic anemia, aortic stenosis, hypertension presenting with complaint of chest pain.  He describes the pain as a tightness and pressure across his chest which started at 930 this morning.  This pain resolved after taking 4 baby aspirin.  Later in the day around noon chest pain similar to earlier episode recurred.  He stated he took another 4 baby aspirin and since pain did not resolve he called EMS.  He currently has no chest pain on arrival to the ED.  He denies shortness of breath.  He denies nausea or diaphoresis.  There was no radiation of the pain.  He states that his chest pain is similar to prior episodes when he has required a blood transfusion for anemia.  There are no other associated systemic symptoms, there are no other alleviating or modifying factors.   Past Medical History:  Diagnosis Date  . Anemia   . Aortic regurgitation 08/13/2017   moderate  . Aortic stenosis 08/13/2017   mild  . Bladder cancer ([place])   . Chronic kidney disease   . H/O sinus tachycardia   . Hypertension   . Mitral regurgitation 08/13/2017   mild  . Non-ST elevation (NSTEMI) myocardial infarction ([place])   . Prediabetes   . Secondary hyperparathyroidism [facility])     Patient Active Problem List   Diagnosis Date Noted  . Palliative care by specialist   . Advance care planning   . Non-ST elevation (NSTEMI) myocardial infarction ([place])   . Chest pain 08/12/2017  . Diarrhea 07/22/2017  . Hyponatremia 07/22/2017  . Hypokalemia 07/22/2017  . Elevated troponin 07/22/2017  . Chest discomfort 07/22/2017  . Hyperkalemia 07/22/2017  . Anemia 07/21/2017  . Glaucoma 07/21/2017  . UTI (lower urinary tract  infection) 06/19/2016  . Sepsis ([place]) 06/19/2016  . Hyperglycemia 06/19/2016  . Hypertension 06/19/2016  . AKI (acute kidney injury) ([place]) 06/19/2016  . CKD (chronic kidney disease), stage V ([place]) 06/19/2016  . End stage renal disease ([place]) 03/22/2012    Past Surgical History:  Procedure Laterality Date  . ARTERIOVENOUS GRAFT PLACEMENT     left arm AVG  . AV FISTULA PLACEMENT  03/30/2012   Procedure: ARTERIOVENOUS (AV) FISTULA CREATION;  Surgeon: Angelia Mould, MD;  Location: [facility];  Service: Vascular;  Laterality: Left;  . CYSTECTOMY     w/ ileal diversion for bladder cancer   . TONSILLECTOMY         Home Medications    Prior to Admission medications   Medication Sig Start Date End Date Taking? Authorizing Provider  calcium carbonate (TUMS EX) 750 MG chewable tablet Chew 3 tablets by mouth 2 (two) times daily. LUNCH and SUPPER   Yes [provider]  dorzolamide (TRUSOPT) 2 % ophthalmic solution Place 1 drop into the right eye 2 (two) times daily.    Yes [provider]  ENSURE PLUS (ENSURE PLUS) LIQD Take 1 Can by mouth daily.   Yes [provider]  ferrous sulfate (IRON SUPPLEMENT) 325 (65 FE) MG tablet Take 325 mg by mouth 2 (two) times daily with a meal. LUNCH and SUPPER   Yes [provider]  furosemide (LASIX) 80 MG  tablet Take 80 mg by mouth daily.    Yes [provider]  isosorbide mononitrate (IMDUR) 60 MG 24 hr tablet Take 1 tablet (60 mg total) by mouth daily. 08/26/17  Yes Vann, Jessica U, DO  latanoprost (XALATAN) 0.005 % ophthalmic solution Place 1 drop into both eyes daily. 5 PM   Yes [provider]  metoprolol tartrate (LOPRESSOR) 100 MG tablet Take 100 mg by mouth 2 (two) times daily. LUNCH and BEDTIME   Yes [provider]  pantoprazole (PROTONIX) 40 MG tablet Take 1 tablet (40 mg total) by mouth daily. Patient not taking: Reported on 09/20/2017 08/14/17   Isaac Bliss, Rayford Halsted, MD     Family History Family History  Problem Relation Age of Onset  . Cancer Mother        BRAIN    Social History Social History   Tobacco Use  . Smoking status: Former Smoker    Packs/day: 2.00    Years: 40.00    Pack years: 80.00    Types: Cigarettes, Pipe    Last attempt to quit: 03/22/2006    Years since quitting: 11.5  . Smokeless tobacco: Never Used  . Tobacco comment: Stopped smoking 10 years ago.  Substance Use Topics  . Alcohol use: Yes    Alcohol/week: 19.2 oz    Types: 7 Cans of beer, 25 Glasses of wine per week  . Drug use: No     Allergies   Patient has no known allergies.   Review of Systems Review of Systems  ROS reviewed and all otherwise negative except for mentioned in HPI   Physical Exam Updated Vital Signs BP (!) 141/55   Pulse 95   Resp (!) 24   SpO2 100%  Vitals reviewed Physical Exam  Physical Examination: General appearance - alert, chronically ill appearing, and in no distress Mental status - alert, oriented to person, place, and time Eyes - no conjunctival injection, no scleral icterus Mouth - mucous membranes moist, pharynx normal without lesions Chest - clear to auscultation, no wheezes, rales or rhonchi, symmetric air entry Heart - normal rate, regular rhythm, normal S1, S2, no murmurs, rubs, clicks or gallops Abdomen - soft, nontender, nondistended, no masses or organomegaly Neurological - alert, oriented, normal speech, no focal findings or movement disorder noted Extremities - peripheral pulses normal, no pedal edema, no clubbing or cyanosis Skin - normal coloration and turgor, no rashes   ED Treatments / Results  Labs (all labs ordered are listed, but only abnormal results are displayed) Labs Reviewed  CBC - Abnormal; Notable for the following components:      Result Value   RBC 2.78 (*)    Hemoglobin 8.9 (*)    HCT 26.1 (*)    RDW 16.0 (*)    All other components within normal limits  I-STAT CHEM 8, ED - Abnormal;  Notable for the following components:   Sodium 132 (*)    BUN 122 (*)    Creatinine, Ser 8.60 (*)    Glucose, Bld 193 (*)    Calcium, Ion 1.09 (*)    TCO2 18 (*)    Hemoglobin 7.8 (*)    HCT 23.0 (*)    All other components within normal limits  I-STAT TROPONIN, ED - Abnormal; Notable for the following components:   Troponin i, poc 0.60 (*)    All other components within normal limits    EKG  EKG Interpretation  Date/Time:  Tuesday September 20 2017 13:51:00 EST Ventricular  Rate:  101 PR Interval:    QRS Duration: 146 QT Interval:  391 QTC Calculation: 507 R Axis:   -138 Text Interpretation:  Sinus tachycardia Right bundle branch block Repol abnrm suggests ischemia, diffuse leads nonspecific ST changes Confirmed by Alfonzo Beers [id number]) on 09/20/2017 1:54:18 PM       Radiology Dg Chest 2 View  Result Date: 09/20/2017 CLINICAL DATA:  Acute chest pain and cough today. EXAM: CHEST  2 VIEW COMPARISON:  09/13/2017 and prior radiographs FINDINGS: The cardiomediastinal silhouette is unremarkable. COPD changes again identified. There is no evidence of focal airspace disease, pulmonary edema, suspicious pulmonary nodule/mass, pleural effusion, or pneumothorax. No acute bony abnormalities are identified. Heavy vascular calcifications are again noted. IMPRESSION: COPD without evidence of acute cardiopulmonary disease. Electronically Signed   By: Margarette Canada M.D.   On: 09/20/2017 14:56    Procedures Procedures (including critical care time)  Medications Ordered in ED Medications - No data to display   Initial Impression / Assessment and Plan / ED Course  I have reviewed the triage vital signs and the nursing notes.  Pertinent labs & imaging results that were available during my care of the patient were reviewed by me and considered in my medical decision making (see chart for details).    4:44 PM  D/w hospitalist for admission.  They have seen patient and he has requested to  speak with renal about dialysis despite refusing it in the past.  D/w Dr. Florene Glen, renal, he will consult on patient tomorrow. He is very familiar with this patient.    Plan to trend hemoglobin and troponin levels  Final Clinical Impressions(s) / ED Diagnoses   Final diagnoses:  Chest pain, unspecified type  Elevated troponin  Anemia, unspecified type  Chronic renal failure, unspecified CKD stage    ED Discharge Orders    None       Pixie Casino, MD 09/20/17 1659

## 2017-09-20 NOTE — ED Notes (Signed)
Hospitalist at bedside 

## 2017-09-20 NOTE — ED Notes (Addendum)
Date and time results received: 09/20/17  2036   Test: Troponin Critical Value: 0.86  Name of Provider Notified: Georges Mouse, MD  Orders Received? Or Actions Taken?: Continue to monitor.

## 2017-09-20 NOTE — ED Notes (Signed)
Pharmacy messaged to verify medications 

## 2017-09-21 DIAGNOSIS — E871 Hypo-osmolality and hyponatremia: Secondary | ICD-10-CM

## 2017-09-21 DIAGNOSIS — R079 Chest pain, unspecified: Secondary | ICD-10-CM | POA: Diagnosis not present

## 2017-09-21 DIAGNOSIS — N186 End stage renal disease: Secondary | ICD-10-CM

## 2017-09-21 LAB — BPAM RBC
BLOOD PRODUCT EXPIRATION DATE: 201812132359
Blood Product Expiration Date: 201812142359
ISSUE DATE / TIME: 201811272016
ISSUE DATE / TIME: 201811272342
UNIT TYPE AND RH: 6200
Unit Type and Rh: 6200

## 2017-09-21 LAB — COMPREHENSIVE METABOLIC PANEL
ALBUMIN: 2.8 g/dL — AB (ref 3.5–5.0)
ALK PHOS: 61 U/L (ref 38–126)
ALT: 12 U/L — ABNORMAL LOW (ref 17–63)
ANION GAP: 14 (ref 5–15)
AST: 14 U/L — ABNORMAL LOW (ref 15–41)
BUN: 127 mg/dL — ABNORMAL HIGH (ref 6–20)
CALCIUM: 8.2 mg/dL — AB (ref 8.9–10.3)
CO2: 19 mmol/L — AB (ref 22–32)
Chloride: 104 mmol/L (ref 101–111)
Creatinine, Ser: 8.2 mg/dL — ABNORMAL HIGH (ref 0.61–1.24)
GFR, EST AFRICAN AMERICAN: 6 mL/min — AB (ref >60)
GFR, EST NON AFRICAN AMERICAN: 6 mL/min — AB (ref >60)
Glucose, Bld: 105 mg/dL — ABNORMAL HIGH (ref 65–99)
Potassium: 4.7 mmol/L (ref 3.5–5.1)
SODIUM: 137 mmol/L (ref 135–145)
TOTAL PROTEIN: 6 g/dL — AB (ref 6.5–8.1)
Total Bilirubin: 0.5 mg/dL (ref 0.3–1.2)

## 2017-09-21 LAB — PROTIME-INR
INR: 1.1
PROTHROMBIN TIME: 14.2 s (ref 11.4–15.2)

## 2017-09-21 LAB — TROPONIN I: Troponin I: 0.63 ng/mL (ref <0.03)

## 2017-09-21 LAB — TYPE AND SCREEN
ABO/RH(D): A POS
Antibody Screen: NEGATIVE
UNIT DIVISION: 0
UNIT DIVISION: 0

## 2017-09-21 LAB — CBC
HCT: 30.9 % — ABNORMAL LOW (ref 39.0–52.0)
HEMOGLOBIN: 10.5 g/dL — AB (ref 13.0–17.0)
MCH: 30.2 pg (ref 26.0–34.0)
MCHC: 34 g/dL (ref 30.0–36.0)
MCV: 88.8 fL (ref 78.0–100.0)
PLATELETS: 125 10*3/uL — AB (ref 150–400)
RBC: 3.48 MIL/uL — AB (ref 4.22–5.81)
RDW: 17.4 % — ABNORMAL HIGH (ref 11.5–15.5)
WBC: 8 10*3/uL (ref 4.0–10.5)

## 2017-09-21 LAB — CBG MONITORING, ED: GLUCOSE-CAPILLARY: 134 mg/dL — AB (ref 65–99)

## 2017-09-21 MED ORDER — ISOSORBIDE MONONITRATE ER 30 MG PO TB24: 90.0000 mg | ORAL_TABLET | Freq: Every day | ORAL | 0 refills | Status: DC

## 2017-09-21 MED ORDER — ISOSORBIDE MONONITRATE ER 30 MG PO TB24
90.0000 mg | ORAL_TABLET | Freq: Every day | ORAL | Status: DC
  Administered 2017-09-21: 90 mg via ORAL
  Filled 2017-09-21: qty 3

## 2017-09-21 MED ORDER — PANCRELIPASE (LIP-PROT-AMYL) 12000-38000 UNITS PO CPEP
12000.0000 [IU] | ORAL_CAPSULE | Freq: Three times a day (TID) | ORAL | Status: DC
  Administered 2017-09-21: 12000 [IU] via ORAL
  Filled 2017-09-21 (×2): qty 1

## 2017-09-21 NOTE — ED Notes (Signed)
Renal/Carb Modified diet breakfast tray ordered @ 0505.

## 2017-09-21 NOTE — ED Notes (Signed)
Date and time results received: 09/21/17 2224   Test: Troponin Critical Value: 0.97  Name of Provider Notified: Lamar Blinks, MD

## 2017-09-21 NOTE — ED Notes (Signed)
Pt ambulated down hallway with this RN using walker pt did well with steady gait.

## 2017-09-21 NOTE — ED Notes (Signed)
Social work states transported can be arraigned now for discharge. Pt to call son to come pick him up.

## 2017-09-21 NOTE — Consult Note (Signed)
   [facility] Inpatient Consult   09/21/2017  Brock Larmon Quesnell [birth year] [id number]    [facility] Care Management follow up due to patient being on high risk list.   Spoke with ED RNCM. Discharge plan is for home with [place] to transition to hospice/palliative care services at home. Also to have physician home visits.   Therefore, no identifiable [facility] Care Management needs at this time.   Appreciative of ED [facility] collaboration.    Marthenia Rolling, MSN-Ed, RN,BSN [facility] Liaison [phone]

## 2017-09-21 NOTE — Discharge Summary (Signed)
Physician Discharge Summary  Matthew Mays PNT:[id number] DOB: Jul 21, 1941 DOA: 09/20/2017  PCP: Estanislado Emms, MD  Admit date: 09/20/2017 Discharge date: 09/21/2017   Recommendations for Outpatient Follow-Up:   1. Home health 2. Palliative care to follow at home 3. Referral made for PCP to come to his home   Discharge Diagnosis:   Active Problems:   End stage renal disease ([place])   Hyperglycemia   Hypertension   Anemia   Glaucoma   Hyponatremia   Chest pain   Palliative care by specialist   Non-ST elevation (NSTEMI) myocardial infarction ([place])   Symptomatic anemia   Discharge disposition:  Home:  Discharge Condition: Improved.  Diet recommendation: Low sodium, heart healthy  Wound care: None.   History of Present Illness:   Matthew Mays is a 76 y.o. male with medical history significant for CKD stage V, COPD on home O2, anemia of chronic disease, history of sinus tachycardia, hypertension, history of mitral regurgitation and aortic regurgitation, history of an STEMI, prediabetes, secondary hyperparathyroidism, history of bladder cancer on permanent cath, resenting with chest pain, and tightness, with pressure across his chest, starting around 930 this morning, resolved after taking 4 baby aspirin.  Around noon, the patient had similar episode took another 4 baby aspirins, the pain did not resolve, which he called EMS.  On arrival, he did not have any symptoms.  He denies any increasing shortness of breath.  He denies any nausea or diaphoresis.  He denies any radiation to the jaw or to the left arm.  He reports that this pain is similar to that of symptomatic anemia, when he required a blood transfusion for anemia, at which time the symptoms have resolved.  He denies any bleeding issues.  He denies any cough, or recent infection.  The pain.  He denies any dysuria or hematuria.  He denies any lower extremity swelling.  He is compliant with his medications, denies taking  significant amount of NSAIDs other than aspirin daily.     Hospital Course by Problem:    Chest pain syndrome/known CAD heart score 425, troponin 0.6, EKG without evidence of ACS.  History of NSTEMI  -refusing intervention -transfuse 2 units PRBC -resolved -increased imdur  Anemia of chronic disease, symptomatic anemia  S/p 2 units PRBC -does not want referral to GI or hematology  ESRD, patient is not on dialysis, nor does he wish to be in HD. -not interested in HD  Hypertension  Continue home anti-hypertensive medications    Hyponatremia improved   Prediabetes, glucose is today 193, Diet control  COPD without exacerbation Osats  Normal in 2 L  CXR NAD   WBC  Normal  Continue nebs and O2       Medical Consultants:    None.   Discharge Exam:   Vitals:   09/21/17 1053 09/21/17 1100  BP:  (!) 131/59  Pulse: 92 83  Resp: 13 18  Temp:    SpO2: 100% 100%   Vitals:   09/21/17 0900 09/21/17 1000 09/21/17 1053 09/21/17 1100  BP: (!) 123/56 (!) 129/49  (!) 131/59  Pulse: 66  92 83  Resp: 18 (!) 21 13 18   Temp:      TempSrc:      SpO2: 100%  100% 100%  Weight:      Height:        Gen:  NAD    The results of significant diagnostics from this hospitalization (including imaging, microbiology, ancillary and laboratory) are  listed below for reference.     Procedures and Diagnostic Studies:   Dg Chest 2 View  Result Date: 09/20/2017 CLINICAL DATA:  Acute chest pain and cough today. EXAM: CHEST  2 VIEW COMPARISON:  09/13/2017 and prior radiographs FINDINGS: The cardiomediastinal silhouette is unremarkable. COPD changes again identified. There is no evidence of focal airspace disease, pulmonary edema, suspicious pulmonary nodule/mass, pleural effusion, or pneumothorax. No acute bony abnormalities are identified. Heavy vascular calcifications are again noted. IMPRESSION: COPD without evidence of acute cardiopulmonary disease. Electronically Signed    By: Margarette Canada M.D.   On: 09/20/2017 14:56     Labs:   Basic Metabolic Panel: Recent Labs  Lab 09/20/17 1358 09/21/17 0448  NA 132* 137  K 4.7 4.7  CL 101 104  CO2  --  19*  GLUCOSE 193* 105*  BUN 122* 127*  CREATININE 8.60* 8.20*  CALCIUM  --  8.2*   GFR Estimated Creatinine Clearance: 7.9 mL/min (A) (by C-G formula based on SCr of 8.2 mg/dL (H)). Liver Function Tests: Recent Labs  Lab 09/21/17 0448  AST 14*  ALT 12*  ALKPHOS 61  BILITOT 0.5  PROT 6.0*  ALBUMIN 2.8*   No results for input(s): LIPASE, AMYLASE in the last 168 hours. No results for input(s): AMMONIA in the last 168 hours. Coagulation profile Recent Labs  Lab 09/21/17 0448  INR 1.10    CBC: Recent Labs  Lab 09/20/17 1335 09/20/17 1358 09/21/17 0448  WBC 10.4  --  8.0  HGB 8.9* 7.8* 10.5*  HCT 26.1* 23.0* 30.9*  MCV 93.9  --  88.8  PLT 183  --  125*   Cardiac Enzymes: Recent Labs  Lab 09/20/17 1850 09/20/17 2125 09/21/17 0019  TROPONINI 0.86* 0.97* 0.63*   BNP: Invalid input(s): POCBNP CBG: Recent Labs  Lab 09/20/17 1808 09/21/17 0758  GLUCAP 97 134*   D-Dimer No results for input(s): DDIMER in the last 72 hours. Hgb A1c Recent Labs    09/20/17 1848  HGBA1C 5.7*   Lipid Profile No results for input(s): CHOL, HDL, LDLCALC, TRIG, CHOLHDL, LDLDIRECT in the last 72 hours. Thyroid function studies Recent Labs    09/20/17 1848  TSH 4.177   Anemia work up No results for input(s): VITAMINB12, FOLATE, FERRITIN, TIBC, IRON, RETICCTPCT in the last 72 hours. Microbiology No results found for this or any previous visit (from the past 240 hour(s)).   Discharge Instructions:   Discharge Instructions    Diet - low sodium heart healthy   Complete by:  As directed    Discharge instructions   Complete by:  As directed    Have not prescribed creon as side effects may worsen diarrhea if not pancreatic Resume home health Referral made for PCP to come to home as well as  pharmacy to deliver meds--- will need to transfer meds to them Palliative care to follow at home   Increase activity slowly   Complete by:  As directed      Allergies as of 09/21/2017   No Known Allergies     Medication List    STOP taking these medications   pantoprazole 40 MG tablet Commonly known as:  PROTONIX     TAKE these medications   calcium carbonate 750 MG chewable tablet Commonly known as:  TUMS EX Chew 3 tablets by mouth 2 (two) times daily. LUNCH and SUPPER   dorzolamide 2 % ophthalmic solution Commonly known as:  TRUSOPT Place 1 drop into the right eye 2 (  two) times daily.   ENSURE PLUS Liqd Take 1 Can by mouth daily.   furosemide 80 MG tablet Commonly known as:  LASIX Take 80 mg by mouth daily.   IRON SUPPLEMENT 325 (65 FE) MG tablet Generic drug:  ferrous sulfate Take 325 mg by mouth 2 (two) times daily with a meal. LUNCH and SUPPER   isosorbide mononitrate 30 MG 24 hr tablet Commonly known as:  IMDUR Take 3 tablets (90 mg total) by mouth daily. Start taking on:  09/22/2017 What changed:    medication strength  how much to take   latanoprost 0.005 % ophthalmic solution Commonly known as:  XALATAN Place 1 drop into both eyes daily. 5 PM   metoprolol tartrate 100 MG tablet Commonly known as:  LOPRESSOR Take 100 mg by mouth 2 (two) times daily. LUNCH and BEDTIME      Follow-up Information    referral made for PCP to come to your home Follow up.            Time coordinating discharge: 35 min  Signed:  Geradine Girt   [facility] 09/21/2017, 12:07 PM

## 2017-09-21 NOTE — ED Notes (Signed)
Pt taken to Car in wheel chair to Nurse first. Tech first made aware to help pt to car.

## 2017-09-21 NOTE — ED Notes (Signed)
Pt transferred to hospital bed for comfort.

## 2017-09-21 NOTE — Discharge Planning (Signed)
[facility] contacted [place] for delivery services.  Pt will have to transfer Rx to begin services.

## 2017-09-21 NOTE — Progress Notes (Signed)
Plan to d/c home this afternoon--- care management making referral for MD making house calls, pharmacy to deliver, palliative care at home.. Asked nurse to ambulate with walker.  Eulogio Bear DO

## 2017-09-21 NOTE — Care Management Note (Signed)
Case Management Note  Patient Details  Name: Matthew Mays MRN: [id number] Date of Birth: 07-Jun-1941  Subjective/Objective:                  76 y.o. male with medical history significant for CKD stage V, COPD on home O2, anemia of chronic disease, sinus tachycardia, hypertension, mitral regurgitation and aortic regurgitation, STEMI, prediabetes, secondary hyperparathyroidism, bladder cancer on permanent cath, resenting with chest pain, and tightness, with pressure across his chest.  Action/Plan: Plan to d/c home this afternoon--- care management making referral for MD making house calls, pharmacy to deliver, palliative care at home.    Expected Discharge Date:       09/21/2017           Expected Discharge Plan:  [place]  In-House Referral:     Discharge planning Services  CM Consult  Post Acute Care Choice:  Resumption of Svcs/PTA Provider Choice offered to:     DME Arranged:    DME Agency:     HH Arranged:    [place] Agency:  [facility] [employer]  Status of Service:  In process, will continue to follow  If discussed at Long Length of Stay Meetings, dates discussed:    Additional Comments: Pt currently active with [facility] for RN services.  Resumption of care requested. Corliss Blacker, RN of [facility]  notified.  No DME needs identified at this time.  Ronita Hipps of [facility] consulted to speak with pt regarding home hospice/palliative services.  Lauretta Grill, NP of [facility] consulted regarding home visits and home pharmacy delivery contacts. [facility] will contact pharmacies that deliver in conjunction.       Fuller Mandril, RN 09/21/2017, 10:48 AM

## 2017-09-23 DIAGNOSIS — E1122 Type 2 diabetes mellitus with diabetic chronic kidney disease: Secondary | ICD-10-CM | POA: Diagnosis not present

## 2017-09-23 DIAGNOSIS — I080 Rheumatic disorders of both mitral and aortic valves: Secondary | ICD-10-CM | POA: Diagnosis not present

## 2017-09-23 DIAGNOSIS — D631 Anemia in chronic kidney disease: Secondary | ICD-10-CM | POA: Diagnosis not present

## 2017-09-23 DIAGNOSIS — N186 End stage renal disease: Secondary | ICD-10-CM | POA: Diagnosis not present

## 2017-09-23 DIAGNOSIS — I120 Hypertensive chronic kidney disease with stage 5 chronic kidney disease or end stage renal disease: Secondary | ICD-10-CM | POA: Diagnosis not present

## 2017-09-23 DIAGNOSIS — H409 Unspecified glaucoma: Secondary | ICD-10-CM | POA: Diagnosis not present

## 2017-09-29 DIAGNOSIS — I120 Hypertensive chronic kidney disease with stage 5 chronic kidney disease or end stage renal disease: Secondary | ICD-10-CM | POA: Diagnosis not present

## 2017-09-29 DIAGNOSIS — E1122 Type 2 diabetes mellitus with diabetic chronic kidney disease: Secondary | ICD-10-CM | POA: Diagnosis not present

## 2017-09-29 DIAGNOSIS — I080 Rheumatic disorders of both mitral and aortic valves: Secondary | ICD-10-CM | POA: Diagnosis not present

## 2017-09-29 DIAGNOSIS — N186 End stage renal disease: Secondary | ICD-10-CM | POA: Diagnosis not present

## 2017-09-29 DIAGNOSIS — H409 Unspecified glaucoma: Secondary | ICD-10-CM | POA: Diagnosis not present

## 2017-09-29 DIAGNOSIS — D631 Anemia in chronic kidney disease: Secondary | ICD-10-CM | POA: Diagnosis not present

## 2017-10-07 ENCOUNTER — Encounter (HOSPITAL_COMMUNITY): Payer: Self-pay

## 2017-10-07 ENCOUNTER — Other Ambulatory Visit: Payer: Self-pay

## 2017-10-07 ENCOUNTER — Emergency Department (HOSPITAL_COMMUNITY)
Admission: EM | Admit: 2017-10-07 | Discharge: 2017-10-07 | Disposition: A | Discharge status: Home / Self Care | Payer: Medicare Other | Attending: Emergency Medicine | Admitting: Emergency Medicine

## 2017-10-07 DIAGNOSIS — R079 Chest pain, unspecified: Secondary | ICD-10-CM | POA: Diagnosis not present

## 2017-10-07 DIAGNOSIS — D649 Anemia, unspecified: Secondary | ICD-10-CM | POA: Diagnosis not present

## 2017-10-07 DIAGNOSIS — Z87891 Personal history of nicotine dependence: Secondary | ICD-10-CM | POA: Diagnosis not present

## 2017-10-07 DIAGNOSIS — Z79899 Other long term (current) drug therapy: Secondary | ICD-10-CM | POA: Diagnosis not present

## 2017-10-07 DIAGNOSIS — I129 Hypertensive chronic kidney disease with stage 1 through stage 4 chronic kidney disease, or unspecified chronic kidney disease: Secondary | ICD-10-CM | POA: Diagnosis not present

## 2017-10-07 DIAGNOSIS — R0789 Other chest pain: Secondary | ICD-10-CM | POA: Diagnosis not present

## 2017-10-07 DIAGNOSIS — N184 Chronic kidney disease, stage 4 (severe): Secondary | ICD-10-CM | POA: Diagnosis not present

## 2017-10-07 LAB — BASIC METABOLIC PANEL
ANION GAP: 15 (ref 5–15)
BUN: 114 mg/dL — ABNORMAL HIGH (ref 6–20)
CALCIUM: 9.1 mg/dL (ref 8.9–10.3)
CO2: 19 mmol/L — AB (ref 22–32)
Chloride: 104 mmol/L (ref 101–111)
Creatinine, Ser: 8.06 mg/dL — ABNORMAL HIGH (ref 0.61–1.24)
GFR calc Af Amer: 7 mL/min — ABNORMAL LOW (ref >60)
GFR calc non Af Amer: 6 mL/min — ABNORMAL LOW (ref >60)
GLUCOSE: 101 mg/dL — AB (ref 65–99)
Potassium: 4.6 mmol/L (ref 3.5–5.1)
Sodium: 138 mmol/L (ref 135–145)

## 2017-10-07 LAB — I-STAT TROPONIN, ED: TROPONIN I, POC: 0.02 ng/mL (ref 0.00–0.08)

## 2017-10-07 LAB — CBC
HCT: 24.9 % — ABNORMAL LOW (ref 39.0–52.0)
HEMOGLOBIN: 8.1 g/dL — AB (ref 13.0–17.0)
MCH: 30.5 pg (ref 26.0–34.0)
MCHC: 32.5 g/dL (ref 30.0–36.0)
MCV: 93.6 fL (ref 78.0–100.0)
Platelets: 165 10*3/uL (ref 150–400)
RBC: 2.66 MIL/uL — ABNORMAL LOW (ref 4.22–5.81)
RDW: 17.6 % — ABNORMAL HIGH (ref 11.5–15.5)
WBC: 8.3 10*3/uL (ref 4.0–10.5)

## 2017-10-07 LAB — PREPARE RBC (CROSSMATCH)

## 2017-10-07 MED ORDER — SODIUM CHLORIDE 0.9 % IV SOLN
10.0000 mL/h | Freq: Once | INTRAVENOUS | Status: AC
  Administered 2017-10-07: 10 mL/h via INTRAVENOUS

## 2017-10-07 MED ORDER — ISOSORBIDE MONONITRATE ER 30 MG PO TB24: 90.0000 mg | ORAL_TABLET | Freq: Every day | ORAL | 3 refills | Status: DC

## 2017-10-07 NOTE — Discharge Instructions (Signed)
You were transfused 2 units of blood today without complication Please return without fail for worsening symptoms, including confusion, passing out, difficulty breathing, chest pains or any other symptoms concerning to you.

## 2017-10-07 NOTE — ED Notes (Signed)
ED Provider at bedside. 

## 2017-10-07 NOTE — ED Notes (Signed)
Second ordered RBC transfusion almost complete. Per Dr. Oleta Mouse, will keep pt for approx 1 hr after to monitor. Pt aware, daughter are on the way to pick him up after d/c.

## 2017-10-07 NOTE — Care Management Note (Signed)
Case Management Note  Patient Details  Name: Matthew Mays [place] MRN: [id number] Date of Birth: 03-27-1941  Subjective/Objective:                  76 yo male in ED with Chest Pain.  Action/Plan: Follow for disposition needs.   Expected Discharge Date:  (unknown)               Expected Discharge Plan:  [place]   Discharge planning Services  CM Consult  Post Acute Care Choice:  Resumption of Svcs/PTA Provider Choice offered to:     HH Arranged:  RN [place] Agency:  [facility] [employer]  Status of Service:  In process, will continue to follow  If discussed at Long Length of Stay Meetings, dates discussed:    Additional Comments: Pt currently active with [facility] for RN services.  Resumption of care requested. Christa See, RN  of [facility]  notified.  No DME needs identified at this time.  Fuller Mandril, RN 10/07/2017, 11:44 AM

## 2017-10-07 NOTE — ED Provider Notes (Signed)
.  Please see previous physicians note regarding patient's presenting history and physical, initial ED course, and associated medical decision making. Seen by Dr. Christy Gentles.   76 year old male with history of recurrent anemia and coronary disease who presents with chest pain, which he typically feels when he has symptomatic anemia.  At time of signout, he does have hemoglobin of 8, but feels baseline around hemoglobin of 10.  He is pending 2 units of blood transfusion.  He has no complaints of GI bleeding.  He does not want to be admitted and has refused any further workup for his anemia as well as cardiac evaluation.  Patient is transfused 2 units of blood.  He is observed for 1 hour after, and does not have any complications.  He feels improved.  States that he is ready for discharge home.  Refill for imdur provided per his request. Strict return and follow-up instructions reviewed. He expressed understanding of all discharge instructions and felt comfortable with the plan of care.    Forde Dandy, MD 10/07/17 [phone]

## 2017-10-07 NOTE — ED Provider Notes (Signed)
[facility] EMERGENCY DEPARTMENT Provider Note   CSN: [id number] Arrival date & time: 10/07/17  6433     History   Chief Complaint Chief Complaint  Patient presents with  . Chest Pain    HPI Matthew Mays is a 76 y.o. male.  The history is provided by the patient.  Chest Pain   This is a new problem. The problem occurs constantly. The problem has been resolved. The pain is moderate. The quality of the pain is described as pressure-like. The pain does not radiate. Pertinent negatives include no shortness of breath and no vomiting. Treatments tried: asa. The treatment provided moderate relief.  Patient with history of chronic anemia, coronary artery disease, chronic kidney disease presents with episode of chest pain. He reports tonight he had an episode of chest pressure, he took an aspirin, he started to feel improved He reports this feels similar to previous episodes when he is anemic Reports a long history of anemia, its chronic and he usually requires blood transfusions He denies any recent blood loss, no vomiting, no bloody stools Reports extensive workup in the past  Past Medical History:  Diagnosis Date  . Anemia   . Aortic regurgitation 08/13/2017   moderate  . Aortic stenosis 08/13/2017   mild  . Bladder cancer ([place])   . Chronic kidney disease   . H/O sinus tachycardia   . Hypertension   . Mitral regurgitation 08/13/2017   mild  . Non-ST elevation (NSTEMI) myocardial infarction ([place])   . Prediabetes   . Secondary hyperparathyroidism [facility])     Patient Active Problem List   Diagnosis Date Noted  . Symptomatic anemia 09/20/2017  . Palliative care by specialist   . Advance care planning   . Non-ST elevation (NSTEMI) myocardial infarction ([place])   . Chest pain 08/12/2017  . Diarrhea 07/22/2017  . Hyponatremia 07/22/2017  . Hypokalemia 07/22/2017  . Elevated troponin 07/22/2017  . Chest discomfort 07/22/2017  . Hyperkalemia 07/22/2017  .  Anemia 07/21/2017  . Glaucoma 07/21/2017  . UTI (lower urinary tract infection) 06/19/2016  . Sepsis ([place]) 06/19/2016  . Hyperglycemia 06/19/2016  . Hypertension 06/19/2016  . AKI (acute kidney injury) ([place]) 06/19/2016  . CKD (chronic kidney disease), stage V ([place]) 06/19/2016  . End stage renal disease ([place]) 03/22/2012    Past Surgical History:  Procedure Laterality Date  . ARTERIOVENOUS GRAFT PLACEMENT     left arm AVG  . AV FISTULA PLACEMENT  03/30/2012   Procedure: ARTERIOVENOUS (AV) FISTULA CREATION;  Surgeon: Angelia Mould, MD;  Location: [facility];  Service: Vascular;  Laterality: Left;  . CYSTECTOMY     w/ ileal diversion for bladder cancer   . TONSILLECTOMY         Home Medications    Prior to Admission medications   Medication Sig Start Date End Date Taking? Authorizing Provider  calcium carbonate (TUMS EX) 750 MG chewable tablet Chew 3 tablets by mouth 2 (two) times daily. LUNCH and SUPPER   Yes [provider]  dorzolamide (TRUSOPT) 2 % ophthalmic solution Place 1 drop into the right eye 2 (two) times daily.    Yes [provider]  ferrous sulfate (IRON SUPPLEMENT) 325 (65 FE) MG tablet Take 325 mg by mouth 2 (two) times daily with a meal. LUNCH and SUPPER   Yes [provider]  furosemide (LASIX) 80 MG tablet Take 80 mg by mouth daily.    Yes [provider]  isosorbide mononitrate (IMDUR) 30  MG 24 hr tablet Take 3 tablets (90 mg total) by mouth daily. 09/22/17  Yes Vann, Jessica U, DO  latanoprost (XALATAN) 0.005 % ophthalmic solution Place 1 drop into both eyes daily. 5 PM   Yes [provider]  metoprolol tartrate (LOPRESSOR) 100 MG tablet Take 100 mg by mouth 2 (two) times daily. LUNCH and BEDTIME   Yes [provider]    Family History Family History  Problem Relation Age of Onset  . Cancer Mother        BRAIN    Social History Social History   Tobacco Use  . Smoking status: Former Smoker     Packs/day: 2.00    Years: 40.00    Pack years: 80.00    Types: Cigarettes, Pipe    Last attempt to quit: 03/22/2006    Years since quitting: 11.5  . Smokeless tobacco: Never Used  . Tobacco comment: Stopped smoking 10 years ago.  Substance Use Topics  . Alcohol use: Yes    Alcohol/week: 19.2 oz    Types: 25 Glasses of wine, 7 Cans of beer per week  . Drug use: No     Allergies   Patient has no known allergies.   Review of Systems Review of Systems  Constitutional: Positive for fatigue.  Respiratory: Negative for shortness of breath.   Cardiovascular: Positive for chest pain.  Gastrointestinal: Negative for blood in stool and vomiting.  All other systems reviewed and are negative.    Physical Exam Updated Vital Signs BP (!) 134/58   Pulse 86   Temp 97.7 F (36.5 C) (Oral)   Resp 17   Ht 1.778 m (5\' 10" )   Wt 72.6 kg (160 lb)   SpO2 100%   BMI 22.96 kg/m   Physical Exam CONSTITUTIONAL: Chronically ill-appearing, no distress HEAD: Normocephalic/atraumatic EYES: EOMI ENMT: Mucous membranes moist NECK: supple no meningeal signs SPINE/BACK:entire spine nontender CV: S1/S2 noted, murmur noted LUNGS: Lungs are clear to auscultation bilaterally, no apparent distress ABDOMEN: soft, nontender, urostomy in place GU:no cva tenderness NEURO: Pt is awake/alert/appropriate, moves all extremitiesx4.  No facial droop.   EXTREMITIES:  full ROM SKIN: warm, color normal PSYCH: no abnormalities of mood noted, alert and oriented to situation   ED Treatments / Results  Labs (all labs ordered are listed, but only abnormal results are displayed) Labs Reviewed  BASIC METABOLIC PANEL - Abnormal; Notable for the following components:      Result Value   CO2 19 (*)    Glucose, Bld 101 (*)    BUN 114 (*)    Creatinine, Ser 8.06 (*)    GFR calc non Af Amer 6 (*)    GFR calc Af Amer 7 (*)    All other components within normal limits  CBC - Abnormal; Notable for the following  components:   RBC 2.66 (*)    Hemoglobin 8.1 (*)    HCT 24.9 (*)    RDW 17.6 (*)    All other components within normal limits  I-STAT TROPONIN, ED  TYPE AND SCREEN  PREPARE RBC (CROSSMATCH)    EKG  EKG Interpretation  Date/Time:  Friday October 07 2017 04:52:40 EST Ventricular Rate:  102 PR Interval:    QRS Duration: 142 QT Interval:  384 QTC Calculation: 501 R Axis:   -105 Text Interpretation:  Sinus tachycardia RBBB and LAFB Abnormal ekg No significant change since last tracing Confirmed by Ripley Fraise [phone]) on 10/07/2017 4:58:15 AM Also confirmed by Ripley Fraise [phone]),  editor Laurena Spies [phone])  on 10/07/2017 6:54:59 AM       Radiology No results found.  Procedures Procedures  Medications Ordered in ED Medications  0.9 %  sodium chloride infusion (not administered)     Initial Impression / Assessment and Plan / ED Course  I have reviewed the triage vital signs and the nursing notes.  Pertinent labs  results that were available during my care of the patient were reviewed by me and considered in my medical decision making (see chart for details).     7:39 AM Patient with extensive medical conditions including CAD, chronic kidney disease not on dialysis He also has a history of recurrent anemia Per recent admissions, patient has refused further cardiac intervention he has also refused further workup of his anemia He would generally have these episodes of chest pain when he begins to become anemic  Currently he is chest pain-free He reports he usually feels better when his hemoglobin is above 9-10 At this point I do not feel further cardiac evaluation is warranted, as he has no EKG changes, and troponin is negative 7:40 AM At signout to dr Oleta Mouse, reassess after transfusion He would like to be discharged after that He is also requesting his home meds to be refilled   Final Clinical Impressions(s) / ED Diagnoses   Final diagnoses:    Symptomatic anemia    ED Discharge Orders    None       Ripley Fraise, MD 10/07/17 [phone]

## 2017-10-07 NOTE — ED Triage Notes (Signed)
Pt. Arrives via EMS for CP started 0300 tonight. Pt. Took 324 of aspirin at home. Pt. Reports chest pain of 2 out of 10. Per EMS pt. Had 3 transfusions 3 weeks ago due to low hemoglobin. Pt. Reports feeling the same when that happened.

## 2017-10-08 LAB — TYPE AND SCREEN
ABO/RH(D): A POS
Antibody Screen: NEGATIVE
UNIT DIVISION: 0
Unit division: 0

## 2017-10-08 LAB — BPAM RBC
BLOOD PRODUCT EXPIRATION DATE: 201901012359
Blood Product Expiration Date: 201901012359
ISSUE DATE / TIME: 201812140821
ISSUE DATE / TIME: 201812141100
UNIT TYPE AND RH: 6200
Unit Type and Rh: 6200

## 2017-10-09 DIAGNOSIS — H409 Unspecified glaucoma: Secondary | ICD-10-CM | POA: Diagnosis not present

## 2017-10-09 DIAGNOSIS — I080 Rheumatic disorders of both mitral and aortic valves: Secondary | ICD-10-CM | POA: Diagnosis not present

## 2017-10-09 DIAGNOSIS — E1122 Type 2 diabetes mellitus with diabetic chronic kidney disease: Secondary | ICD-10-CM | POA: Diagnosis not present

## 2017-10-09 DIAGNOSIS — D631 Anemia in chronic kidney disease: Secondary | ICD-10-CM | POA: Diagnosis not present

## 2017-10-09 DIAGNOSIS — N186 End stage renal disease: Secondary | ICD-10-CM | POA: Diagnosis not present

## 2017-10-09 DIAGNOSIS — I120 Hypertensive chronic kidney disease with stage 5 chronic kidney disease or end stage renal disease: Secondary | ICD-10-CM | POA: Diagnosis not present

## 2017-10-20 DIAGNOSIS — D631 Anemia in chronic kidney disease: Secondary | ICD-10-CM | POA: Diagnosis not present

## 2017-10-20 DIAGNOSIS — H409 Unspecified glaucoma: Secondary | ICD-10-CM | POA: Diagnosis not present

## 2017-10-20 DIAGNOSIS — I120 Hypertensive chronic kidney disease with stage 5 chronic kidney disease or end stage renal disease: Secondary | ICD-10-CM | POA: Diagnosis not present

## 2017-10-20 DIAGNOSIS — I080 Rheumatic disorders of both mitral and aortic valves: Secondary | ICD-10-CM | POA: Diagnosis not present

## 2017-10-20 DIAGNOSIS — N186 End stage renal disease: Secondary | ICD-10-CM | POA: Diagnosis not present

## 2017-10-20 DIAGNOSIS — E1122 Type 2 diabetes mellitus with diabetic chronic kidney disease: Secondary | ICD-10-CM | POA: Diagnosis not present

## 2017-10-21 ENCOUNTER — Encounter (HOSPITAL_COMMUNITY): Payer: Self-pay | Admitting: Emergency Medicine

## 2017-10-21 ENCOUNTER — Emergency Department (HOSPITAL_COMMUNITY): Payer: Medicare Other

## 2017-10-21 ENCOUNTER — Inpatient Hospital Stay (HOSPITAL_COMMUNITY)
Admission: EM | Admit: 2017-10-21 | Discharge: 2017-10-23 | DRG: 291 | Disposition: A | Discharge status: Home Health | Payer: Medicare Other | Attending: Internal Medicine | Admitting: Internal Medicine

## 2017-10-21 ENCOUNTER — Other Ambulatory Visit: Payer: Self-pay

## 2017-10-21 DIAGNOSIS — R079 Chest pain, unspecified: Secondary | ICD-10-CM | POA: Diagnosis not present

## 2017-10-21 DIAGNOSIS — I2510 Atherosclerotic heart disease of native coronary artery without angina pectoris: Secondary | ICD-10-CM | POA: Diagnosis not present

## 2017-10-21 DIAGNOSIS — I252 Old myocardial infarction: Secondary | ICD-10-CM

## 2017-10-21 DIAGNOSIS — R0789 Other chest pain: Secondary | ICD-10-CM | POA: Diagnosis not present

## 2017-10-21 DIAGNOSIS — Z9981 Dependence on supplemental oxygen: Secondary | ICD-10-CM

## 2017-10-21 DIAGNOSIS — I5030 Unspecified diastolic (congestive) heart failure: Secondary | ICD-10-CM | POA: Diagnosis not present

## 2017-10-21 DIAGNOSIS — R778 Other specified abnormalities of plasma proteins: Secondary | ICD-10-CM | POA: Diagnosis present

## 2017-10-21 DIAGNOSIS — N179 Acute kidney failure, unspecified: Secondary | ICD-10-CM | POA: Diagnosis not present

## 2017-10-21 DIAGNOSIS — N186 End stage renal disease: Secondary | ICD-10-CM | POA: Diagnosis not present

## 2017-10-21 DIAGNOSIS — Z8551 Personal history of malignant neoplasm of bladder: Secondary | ICD-10-CM

## 2017-10-21 DIAGNOSIS — Z8546 Personal history of malignant neoplasm of prostate: Secondary | ICD-10-CM

## 2017-10-21 DIAGNOSIS — R627 Adult failure to thrive: Secondary | ICD-10-CM | POA: Diagnosis present

## 2017-10-21 DIAGNOSIS — R0602 Shortness of breath: Secondary | ICD-10-CM | POA: Diagnosis not present

## 2017-10-21 DIAGNOSIS — I132 Hypertensive heart and chronic kidney disease with heart failure and with stage 5 chronic kidney disease, or end stage renal disease: Principal | ICD-10-CM | POA: Diagnosis present

## 2017-10-21 DIAGNOSIS — I214 Non-ST elevation (NSTEMI) myocardial infarction: Secondary | ICD-10-CM | POA: Diagnosis not present

## 2017-10-21 DIAGNOSIS — R7989 Other specified abnormal findings of blood chemistry: Secondary | ICD-10-CM | POA: Diagnosis present

## 2017-10-21 DIAGNOSIS — H409 Unspecified glaucoma: Secondary | ICD-10-CM

## 2017-10-21 DIAGNOSIS — R748 Abnormal levels of other serum enzymes: Secondary | ICD-10-CM

## 2017-10-21 DIAGNOSIS — R7303 Prediabetes: Secondary | ICD-10-CM | POA: Diagnosis present

## 2017-10-21 DIAGNOSIS — Z9115 Patient's noncompliance with renal dialysis: Secondary | ICD-10-CM

## 2017-10-21 DIAGNOSIS — Z87891 Personal history of nicotine dependence: Secondary | ICD-10-CM

## 2017-10-21 DIAGNOSIS — Z936 Other artificial openings of urinary tract status: Secondary | ICD-10-CM

## 2017-10-21 DIAGNOSIS — R739 Hyperglycemia, unspecified: Secondary | ICD-10-CM | POA: Diagnosis not present

## 2017-10-21 DIAGNOSIS — D649 Anemia, unspecified: Secondary | ICD-10-CM | POA: Diagnosis not present

## 2017-10-21 DIAGNOSIS — I120 Hypertensive chronic kidney disease with stage 5 chronic kidney disease or end stage renal disease: Secondary | ICD-10-CM | POA: Diagnosis not present

## 2017-10-21 DIAGNOSIS — J449 Chronic obstructive pulmonary disease, unspecified: Secondary | ICD-10-CM | POA: Diagnosis present

## 2017-10-21 DIAGNOSIS — Z79899 Other long term (current) drug therapy: Secondary | ICD-10-CM

## 2017-10-21 DIAGNOSIS — N2581 Secondary hyperparathyroidism of renal origin: Secondary | ICD-10-CM | POA: Diagnosis present

## 2017-10-21 DIAGNOSIS — N185 Chronic kidney disease, stage 5: Secondary | ICD-10-CM | POA: Diagnosis not present

## 2017-10-21 DIAGNOSIS — I10 Essential (primary) hypertension: Secondary | ICD-10-CM

## 2017-10-21 DIAGNOSIS — D638 Anemia in other chronic diseases classified elsewhere: Secondary | ICD-10-CM | POA: Diagnosis present

## 2017-10-21 DIAGNOSIS — D631 Anemia in chronic kidney disease: Secondary | ICD-10-CM

## 2017-10-21 DIAGNOSIS — Z6823 Body mass index (BMI) 23.0-23.9, adult: Secondary | ICD-10-CM

## 2017-10-21 LAB — BASIC METABOLIC PANEL
Anion gap: 17 — ABNORMAL HIGH (ref 5–15)
BUN: 119 mg/dL — AB (ref 6–20)
CALCIUM: 8.9 mg/dL (ref 8.9–10.3)
CO2: 20 mmol/L — ABNORMAL LOW (ref 22–32)
Chloride: 99 mmol/L — ABNORMAL LOW (ref 101–111)
Creatinine, Ser: 8 mg/dL — ABNORMAL HIGH (ref 0.61–1.24)
GFR calc Af Amer: 7 mL/min — ABNORMAL LOW (ref >60)
GFR, EST NON AFRICAN AMERICAN: 6 mL/min — AB (ref >60)
GLUCOSE: 90 mg/dL (ref 65–99)
Potassium: 5 mmol/L (ref 3.5–5.1)
Sodium: 136 mmol/L (ref 135–145)

## 2017-10-21 LAB — TROPONIN I
TROPONIN I: 0.07 ng/mL — AB (ref <0.03)
TROPONIN I: 0.06 ng/mL — AB (ref <0.03)
TROPONIN I: 0.06 ng/mL — AB (ref <0.03)

## 2017-10-21 LAB — I-STAT TROPONIN, ED
TROPONIN I, POC: 0.07 ng/mL (ref 0.00–0.08)
Troponin i, poc: 0.08 ng/mL (ref 0.00–0.08)

## 2017-10-21 LAB — HEMOGLOBIN AND HEMATOCRIT, BLOOD
HCT: 29.1 % — ABNORMAL LOW (ref 39.0–52.0)
Hemoglobin: 9.4 g/dL — ABNORMAL LOW (ref 13.0–17.0)

## 2017-10-21 LAB — CBC
HCT: 28.3 % — ABNORMAL LOW (ref 39.0–52.0)
Hemoglobin: 9.3 g/dL — ABNORMAL LOW (ref 13.0–17.0)
MCH: 29.9 pg (ref 26.0–34.0)
MCHC: 32.9 g/dL (ref 30.0–36.0)
MCV: 91 fL (ref 78.0–100.0)
Platelets: 151 10*3/uL (ref 150–400)
RBC: 3.11 MIL/uL — ABNORMAL LOW (ref 4.22–5.81)
RDW: 19.5 % — AB (ref 11.5–15.5)
WBC: 7.7 10*3/uL (ref 4.0–10.5)

## 2017-10-21 MED ORDER — HYDROCODONE-ACETAMINOPHEN 5-325 MG PO TABS: 1.0000 | ORAL_TABLET | ORAL | Status: DC | PRN

## 2017-10-21 MED ORDER — DORZOLAMIDE HCL 2 % OP SOLN
1.0000 [drp] | Freq: Two times a day (BID) | OPHTHALMIC | Status: DC
  Administered 2017-10-22 – 2017-10-23 (×2): 1 [drp] via OPHTHALMIC
  Filled 2017-10-21: qty 10

## 2017-10-21 MED ORDER — ONDANSETRON HCL 4 MG/2ML IJ SOLN: 4.0000 mg | Freq: Four times a day (QID) | INTRAMUSCULAR | Status: DC | PRN

## 2017-10-21 MED ORDER — ENSURE ENLIVE PO LIQD
237.0000 mL | Freq: Two times a day (BID) | ORAL | Status: DC
  Administered 2017-10-22 (×2): 237 mL via ORAL

## 2017-10-21 MED ORDER — GI COCKTAIL ~~LOC~~: 30.0000 mL | Freq: Four times a day (QID) | ORAL | Status: DC | PRN

## 2017-10-21 MED ORDER — ASPIRIN 81 MG PO CHEW
324.0000 mg | CHEWABLE_TABLET | Freq: Once | ORAL | Status: AC
  Administered 2017-10-21: 324 mg via ORAL
  Filled 2017-10-21: qty 4

## 2017-10-21 MED ORDER — CALCIUM CARBONATE ANTACID 500 MG PO CHEW
3.0000 | CHEWABLE_TABLET | Freq: Two times a day (BID) | ORAL | Status: DC
  Administered 2017-10-21 – 2017-10-23 (×5): 600 mg via ORAL
  Filled 2017-10-21 (×5): qty 3

## 2017-10-21 MED ORDER — FUROSEMIDE 20 MG PO TABS
80.0000 mg | ORAL_TABLET | Freq: Every day | ORAL | Status: DC
  Administered 2017-10-22 – 2017-10-23 (×2): 80 mg via ORAL
  Filled 2017-10-21 (×2): qty 4

## 2017-10-21 MED ORDER — LATANOPROST 0.005 % OP SOLN
1.0000 [drp] | Freq: Every day | OPHTHALMIC | Status: DC
  Administered 2017-10-21 – 2017-10-22 (×2): 1 [drp] via OPHTHALMIC
  Filled 2017-10-21: qty 2.5

## 2017-10-21 MED ORDER — MORPHINE SULFATE (PF) 4 MG/ML IV SOLN: 1.0000 mg | INTRAVENOUS | Status: DC | PRN

## 2017-10-21 MED ORDER — CALCIUM CARBONATE ANTACID 750 MG PO CHEW: 3.0000 | CHEWABLE_TABLET | Freq: Two times a day (BID) | ORAL | Status: DC

## 2017-10-21 MED ORDER — BISACODYL 10 MG RE SUPP: 10.0000 mg | Freq: Every day | RECTAL | Status: DC | PRN

## 2017-10-21 MED ORDER — HEPARIN SODIUM (PORCINE) 5000 UNIT/ML IJ SOLN
5000.0000 [IU] | Freq: Three times a day (TID) | INTRAMUSCULAR | Status: DC
  Administered 2017-10-21 – 2017-10-22 (×3): 5000 [IU] via SUBCUTANEOUS
  Filled 2017-10-21 (×5): qty 1

## 2017-10-21 MED ORDER — NITROGLYCERIN 2 % TD OINT
1.0000 [in_us] | TOPICAL_OINTMENT | Freq: Once | TRANSDERMAL | Status: AC
  Administered 2017-10-21: 1 [in_us] via TOPICAL

## 2017-10-21 MED ORDER — FERROUS SULFATE 325 (65 FE) MG PO TABS
325.0000 mg | ORAL_TABLET | Freq: Two times a day (BID) | ORAL | Status: DC
  Administered 2017-10-21 – 2017-10-23 (×4): 325 mg via ORAL
  Filled 2017-10-21 (×4): qty 1

## 2017-10-21 MED ORDER — METOPROLOL TARTRATE 25 MG PO TABS
100.0000 mg | ORAL_TABLET | ORAL | Status: DC
  Administered 2017-10-21 – 2017-10-23 (×4): 100 mg via ORAL
  Filled 2017-10-21 (×4): qty 4

## 2017-10-21 MED ORDER — SENNOSIDES-DOCUSATE SODIUM 8.6-50 MG PO TABS: 1.0000 | ORAL_TABLET | Freq: Every evening | ORAL | Status: DC | PRN

## 2017-10-21 MED ORDER — ALPRAZOLAM 0.25 MG PO TABS
0.2500 mg | ORAL_TABLET | Freq: Two times a day (BID) | ORAL | Status: DC | PRN
  Administered 2017-10-22: 0.25 mg via ORAL
  Filled 2017-10-21 (×2): qty 1

## 2017-10-21 MED ORDER — ISOSORBIDE MONONITRATE ER 60 MG PO TB24
90.0000 mg | ORAL_TABLET | Freq: Every day | ORAL | Status: DC
  Administered 2017-10-21 – 2017-10-23 (×3): 90 mg via ORAL
  Filled 2017-10-21 (×3): qty 1

## 2017-10-21 NOTE — Progress Notes (Signed)
Initial Nutrition Assessment  DOCUMENTATION CODES:   Not applicable  INTERVENTION:   Ensure Enlive po BID, each supplement provides 350 kcal and 20 grams of protein  NUTRITION DIAGNOSIS:   Increased nutrient needs related to chronic illness as evidenced by estimated needs.  GOAL:   Patient will meet greater than or equal to 90% of their needs  MONITOR:   PO intake, Supplement acceptance, Weight trends, I & O's  REASON FOR ASSESSMENT:   Malnutrition Screening Tool    ASSESSMENT:   Pt with PMH of CKD, HTN, COPD on home O2, hx of bladder cancer s/p cystectomy with ileal diversion presenting with chest pain found ESRD refusing dialysis   Chart reviewed, pt with recent hospital admission. While admitted, pt refused invasive procedures and dialysis. Palliative care consulted for goals of care discussion.    Pt reports he has a fair appetite PTA. Reporting he consumes an Ensure Plus for breakfast, a snack/small meal at lunch (example cheese and crackers), and a frozen meal for dinner (example [employer]).  Pt reports consuming ~50-75% of lunch tray this afternoon, because he does not like the stalks of asparagus or fish.   Pt reports no recent weight loss. Per weight readings, pt's weight has remained stable for the past year. Pt reports a UBW of 165-170 lbs.  Pt at risk for malnutrition but does not meet criteria at this time. Pt amenable to nutritional supplementation while admitted.   Labs reviewed; BUN 119, Creatinine 8.00, Hemoglobin 9.4 Medications reviewed; TUMS, ferrous sulfate  NUTRITION - FOCUSED PHYSICAL EXAM:    Most Recent Value  Orbital Region  No depletion  Upper Arm Region  Moderate depletion  Thoracic and Lumbar Region  No depletion  Buccal Region  No depletion  Temple Region  Moderate depletion  Clavicle Bone Region  Moderate depletion  Clavicle and Acromion Bone Region  Moderate depletion  Scapular Bone Region  Unable to assess  Dorsal Hand  No  depletion  Patellar Region  Mild depletion  Anterior Thigh Region  Mild depletion  Posterior Calf Region  Mild depletion  Edema (RD Assessment)  None     Pt reports "I've always had small legs"  Diet Order:  Diet Heart Room service appropriate? Yes; Fluid consistency: Thin  EDUCATION NEEDS:   Not appropriate for education at this time  Skin:  Skin Assessment: Reviewed RN Assessment  Last BM:  10/20/17  Height:   Ht Readings from Last 1 Encounters:  10/21/17 5\' 10"  (1.778 m)    Weight:   Wt Readings from Last 1 Encounters:  10/21/17 165 lb 2 oz (74.9 kg)    Ideal Body Weight:  75.45 kg  BMI:  Body mass index is 23.69 kg/m.  Estimated Nutritional Needs:   Kcal:  1900-2100  Protein:  95-110 grams  Fluid:  >/= 1.9 L/d  Parks Ranger, MS, RDN, LDN 10/21/2017 4:14 PM

## 2017-10-21 NOTE — H&P (Signed)
History and Physical    Matthew Mays PIR:[id number] DOB: 1941/03/10 DOA: 10/21/2017   PCP: Matthew Emms, MD   Patient coming from:  Home    Chief Complaint: Chest pain  HPI: Matthew Mays is a 76 y.o. male with medical history significant for CKD stage V, COPD on home oxygen, anemia of chronic disease, history of sinus tachycardia, hypertension, history of mitral regurgitation and aortic regurgitation, history of N STEMI, prediabetes, secondary hyperparathyroidism, history of bladder cancer on permanent cath, recently admitted for chest pain, now presenting again with chest pain and tightness, across his chest, since 3:00 this morning, resolved after taking 4 baby aspirin.  On arrival, the patient was initially symptom-free, chest pain returning, requiring Nitropaste, without further recurrence.  He denies any increasing shortness of breath.  He denies any nausea or diaphoresis.  He denies any radiation to the jaw or to the left arm.  He reports the pain being similar to that of prior admission.  Denies any bleeding issues.  He denies any cough.  He makes very little urine, but he denies any dysuria or hematuria.  Denies any lower extremity swelling or calf pain.  During prior admission, the patient has refused any procedures such as cardiac catheterization, despite elevated troponins, and abnormal EKG.  In addition, the patient has been refusing dialysis as well.  He does not wish to have any invasive procedures.  He now is requesting palliative care evaluation, for goals of care.  He is seeking help in medical management only.  He is compliant with his medications.   ED Course:  BP (!) 137/51   Pulse 79   Temp 98.3 F (36.8 C) (Oral)   Resp 14   SpO2 100%   At the ED, his chest pain return, placed on Nitropaste with relief of the symptoms. Potassium 5 BUN 119, creatinine 8, GFR 7 Troponin 0 0.07, with repeat 0.08 White count 7.7, hemoglobin 9.4, platelets 151 Glucose 90 Chest x-ray  NAD 2 D  echo EF 63-01%, grade 1 diastolic, normal systolic   Review of Systems:  As per HPI otherwise all other systems reviewed and are negative  Past Medical History:  Diagnosis Date  . Anemia   . Aortic regurgitation 08/13/2017   moderate  . Aortic stenosis 08/13/2017   mild  . Bladder cancer ([place])   . Chronic kidney disease   . H/O sinus tachycardia   . Hypertension   . Mitral regurgitation 08/13/2017   mild  . Non-ST elevation (NSTEMI) myocardial infarction ([place])   . Prediabetes   . Secondary hyperparathyroidism [facility])     Past Surgical History:  Procedure Laterality Date  . ARTERIOVENOUS GRAFT PLACEMENT     left arm AVG  . AV FISTULA PLACEMENT  03/30/2012   Procedure: ARTERIOVENOUS (AV) FISTULA CREATION;  Surgeon: Angelia Mould, MD;  Location: [facility];  Service: Vascular;  Laterality: Left;  . CYSTECTOMY     w/ ileal diversion for bladder cancer   . TONSILLECTOMY      Social History Social History   Socioeconomic History  . Marital status: Widowed    Spouse name: Not on file  . Number of children: Not on file  . Years of education: Not on file  . Highest education level: Not on file  Social Needs  . Financial resource strain: Not on file  . Food insecurity - worry: Not on file  . Food insecurity - inability: Not on file  . Transportation needs - medical:  Not on file  . Transportation needs - non-medical: Not on file  Occupational History  . Not on file  Tobacco Use  . Smoking status: Former Smoker    Packs/day: 2.00    Years: 40.00    Pack years: 80.00    Types: Cigarettes, Pipe    Last attempt to quit: 03/22/2006    Years since quitting: 11.5  . Smokeless tobacco: Never Used  . Tobacco comment: Stopped smoking 10 years ago.  Substance and Sexual Activity  . Alcohol use: Yes    Alcohol/week: 19.2 oz    Types: 25 Glasses of wine, 7 Cans of beer per week  . Drug use: No  . Sexual activity: No  Other Topics Concern  . Not on file  Social  History Narrative  . Not on file     No Known Allergies  Family History  Problem Relation Age of Onset  . Cancer Mother        BRAIN      Prior to Admission medications   Medication Sig Start Date End Date Taking? Authorizing Provider  calcium carbonate (TUMS EX) 750 MG chewable tablet Chew 3 tablets by mouth 2 (two) times daily. LUNCH and SUPPER   Yes [provider]  dorzolamide (TRUSOPT) 2 % ophthalmic solution Place 1 drop into the right eye 2 (two) times daily.    Yes [provider]  ferrous sulfate (IRON SUPPLEMENT) 325 (65 FE) MG tablet Take 325 mg by mouth 2 (two) times daily with a meal. LUNCH and SUPPER   Yes [provider]  furosemide (LASIX) 80 MG tablet Take 80 mg by mouth daily.    Yes [provider]  isosorbide mononitrate (IMDUR) 30 MG 24 hr tablet Take 3 tablets (90 mg total) by mouth daily. 09/22/17  Yes Mays, Matthew U, DO  latanoprost (XALATAN) 0.005 % ophthalmic solution Place 1 drop into both eyes daily. 5 PM   Yes [provider]  metoprolol tartrate (LOPRESSOR) 100 MG tablet Take 100 mg by mouth 2 (two) times daily. LUNCH and BEDTIME   Yes [provider]  isosorbide mononitrate (IMDUR) 30 MG 24 hr tablet Take 3 tablets (90 mg total) by mouth daily. Patient not taking: Reported on 10/21/2017 10/07/17   Forde Dandy, MD    Physical Exam:  Vitals:   10/21/17 0545 10/21/17 0800 10/21/17 1012 10/21/17 1100  BP: (!) 118/59 (!) 120/49 (!) 147/59 (!) 137/51  Pulse: 90 81 91 79  Resp: 18 16 18 14   Temp:      TempSrc:      SpO2: 100% 100% 100% 100%   Constitutional: NAD, calm, chronically ill appearing Eyes: PERRL, lids and conjunctivae normal ENMT: Mucous membranes are moist, without exudate or lesions  Neck: normal, supple, no masses, no thyromegaly Respiratory: clear to auscultation bilaterally, no wheezing, no crackles. Normal respiratory effort  Cardiovascular: Regular rate and rhythm, 2/6   murmur, rubs or gallops.  Trace lower extremity edema. 2+ pedal pulses. No carotid bruits.  Abdomen: Soft, non tender, No hepatosplenomegaly. Bowel sounds positive.  Musculoskeletal: no clubbing / cyanosis. Moves all extremities Skin: no jaundice, No lesions. Some areas of old ecchymoses  Neurologic: Sensation intact  Strength equal in all extremities Psychiatric:   Alert and oriented x 3. Flat affect    Labs on Admission: I have personally reviewed following labs and imaging studies  CBC: Recent Labs  Lab 10/21/17 0527 10/21/17 0830  WBC 7.7  --   HGB 9.3*  9.4*  HCT 28.3* 29.1*  MCV 91.0  --   PLT 151  --     Basic Metabolic Panel: Recent Labs  Lab 10/21/17 0527  NA 136  K 5.0  CL 99*  CO2 20*  GLUCOSE 90  BUN 119*  CREATININE 8.00*  CALCIUM 8.9    GFR: CrCl cannot be calculated (Unknown ideal weight.).  Liver Function Tests: No results for input(s): AST, ALT, ALKPHOS, BILITOT, PROT, ALBUMIN in the last 168 hours. No results for input(s): LIPASE, AMYLASE in the last 168 hours. No results for input(s): AMMONIA in the last 168 hours.  Coagulation Profile: No results for input(s): INR, PROTIME in the last 168 hours.  Cardiac Enzymes: No results for input(s): CKTOTAL, CKMB, CKMBINDEX, TROPONINI in the last 168 hours.  BNP (last 3 results) No results for input(s): PROBNP in the last 8760 hours.  HbA1C: No results for input(s): HGBA1C in the last 72 hours.  CBG: No results for input(s): GLUCAP in the last 168 hours.  Lipid Profile: No results for input(s): CHOL, HDL, LDLCALC, TRIG, CHOLHDL, LDLDIRECT in the last 72 hours.  Thyroid Function Tests: No results for input(s): TSH, T4TOTAL, FREET4, T3FREE, THYROIDAB in the last 72 hours.  Anemia Panel: No results for input(s): VITAMINB12, FOLATE, FERRITIN, TIBC, IRON, RETICCTPCT in the last 72 hours.  Urine analysis:    Component Value Date/Time   COLORURINE YELLOW 09/20/2017 1853   APPEARANCEUR CLOUDY  (A) 09/20/2017 1853   LABSPEC 1.009 09/20/2017 1853   PHURINE 7.0 09/20/2017 1853   GLUCOSEU NEGATIVE 09/20/2017 1853   HGBUR SMALL (A) 09/20/2017 1853   BILIRUBINUR NEGATIVE 09/20/2017 [place] NEGATIVE 09/20/2017 1853   PROTEINUR 100 (A) 09/20/2017 1853   NITRITE NEGATIVE 09/20/2017 1853   LEUKOCYTESUR LARGE (A) 09/20/2017 1853    Sepsis Labs: @LABRCNTIP (procalcitonin:4,lacticidven:4) )No results found for this or any previous visit (from the past 240 hour(s)).   Radiological Exams on Admission: Dg Chest 2 View  Result Date: 10/21/2017 CLINICAL DATA:  76 y/o  M; chest pain and shortness of breath. EXAM: CHEST  2 VIEW COMPARISON:  09/20/2017 chest radiograph FINDINGS: Normal cardiac silhouette. Aortic atherosclerosis with calcification. No focal consolidation. No pleural effusion or pneumothorax. No acute osseous abnormality is evident. IMPRESSION: No acute pulmonary process identified. Electronically Signed   By: Kristine Garbe M.D.   On: 10/21/2017 05:55    EKG: Independently reviewed.  Assessment/Plan Active Problems:   End stage renal disease ([facility])   Hyperglycemia   Hypertension   AKI (acute kidney injury) ([place])   CKD (chronic kidney disease), stage V ([facility])   Anemia   Glaucoma   Elevated troponin   Chest discomfort   Non-ST elevation (NSTEMI) myocardial infarction ([facility])   Symptomatic anemia  Chest pain syndrome/known CAD/ h/o NSTEMI, with elevated Tn. Troponin   0.07, with repeat 0.08. Chest x-ray NAD 2D   echo EF 02-77%, grade 1 diastolic, normal systolic . Borderline ST depression new when comp[ared to prior.  At the ED, his chest pain return, placed on Nitropaste with relief of the symptoms.Patient adamantly refuses consultation  with cardiology, or invasive procedures such as cardiac catheterization.  He wishes to have palliative care discuss the goals of care with him.  He is okay with following up with labs.  Admit to Telemetry/ Observation Chest  pain order set Cycle troponins EKG in am continue ASA, O2 and NTG as needed Continue preadmission beta blocker and nitrate GI cocktail Consult to palliative care above  Chronic  kidney disease stage V/ESRD creatinine has been in the 8s for at least one month. BL 7-8  He refuses any dialysis.  He also refuses any nephrology consultation.  He wishes to have palliative care consultation for goals of care, end-of-life issues. Lab Results  Component Value Date   CREATININE 8.00 (H) 10/21/2017   CREATININE 8.06 (H) 10/07/2017   CREATININE 8.20 (H) 09/21/2017  Repeat CMET in am  Hold Lasix, may resume in a.m. if creatinine improve   Hypertension BP (!) 137/51   Pulse 79   Temp 98.3 F (36.8 C) (Oral)   Resp 14   SpO2 100%  Continue home anti-hypertensive medications    Anemia of chronic disease Hemoglobin on admission 9.4 BL 8-9  .  No apparent bleeding issues are noted.  The patient has received transfusions in the past, but no transfusion is indicated today. Repeat CBC in a.m.   He wishes to have supportive transfusion if needed.   COPD without exacerbation Osats normal in RA   CXR NAD  WBC   Continue nebs and O2 prn    DVT prophylaxis:  Heparin  Code Status:    DNR Family Communication:  Discussed with patient Disposition Plan: Expect patient to be discharged to home after condition improves Consults called:    Cardiology per EDP  Admission status: Tele Obs   Sharene Butters, PA-C [facility]   10/21/2017, 11:33 AM

## 2017-10-21 NOTE — ED Notes (Signed)
Patient transported to X-ray 

## 2017-10-21 NOTE — ED Notes (Signed)
Pt given ginger ale to drink. 

## 2017-10-21 NOTE — ED Provider Notes (Signed)
[facility] EMERGENCY DEPARTMENT Provider Note   CSN: [id number] Arrival date & time: 10/21/17  8469     History   Chief Complaint Chief Complaint  Patient presents with  . Chest Pain    HPI Matthew Mays is a 76 y.o. male.  The history is provided by the patient.  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  There is no associated dyspnea, nausea, diaphoresis.  Pain lasted about half hour before resolving.  This is pain he typically gets when his hemoglobin drops.  He has had to get blood transfusions approximately every 2 weeks.  He was last in the emergency on December 14, and got 2 units of blood at that time.  Past Medical History:  Diagnosis Date  . Anemia   . Aortic regurgitation 08/13/2017   moderate  . Aortic stenosis 08/13/2017   mild  . Bladder cancer ([place])   . Chronic kidney disease   . H/O sinus tachycardia   . Hypertension   . Mitral regurgitation 08/13/2017   mild  . Non-ST elevation (NSTEMI) myocardial infarction ([place])   . Prediabetes   . Secondary hyperparathyroidism [facility])     Patient Active Problem List   Diagnosis Date Noted  . Symptomatic anemia 09/20/2017  . Palliative care by specialist   . Advance care planning   . Non-ST elevation (NSTEMI) myocardial infarction ([place])   . Chest pain 08/12/2017  . Diarrhea 07/22/2017  . Hyponatremia 07/22/2017  . Hypokalemia 07/22/2017  . Elevated troponin 07/22/2017  . Chest discomfort 07/22/2017  . Hyperkalemia 07/22/2017  . Anemia 07/21/2017  . Glaucoma 07/21/2017  . UTI (lower urinary tract infection) 06/19/2016  . Sepsis ([place]) 06/19/2016  . Hyperglycemia 06/19/2016  . Hypertension 06/19/2016  . AKI (acute kidney injury) ([place]) 06/19/2016  . CKD (chronic kidney disease), stage V ([place]) 06/19/2016  . End stage renal disease ([place]) 03/22/2012     Past Surgical History:  Procedure Laterality Date  . ARTERIOVENOUS GRAFT PLACEMENT     left arm AVG  . AV FISTULA PLACEMENT  03/30/2012   Procedure: ARTERIOVENOUS (AV) FISTULA CREATION;  Surgeon: Angelia Mould, MD;  Location: [place];  Service: Vascular;  Laterality: Left;  . CYSTECTOMY     w/ ileal diversion for bladder cancer   . TONSILLECTOMY         Home Medications    Prior to Admission medications   Medication Sig Start Date End Date Taking? Authorizing Provider  calcium carbonate (TUMS EX) 750 MG chewable tablet Chew 3 tablets by mouth 2 (two) times daily. LUNCH and SUPPER    [provider]  dorzolamide (TRUSOPT) 2 % ophthalmic solution Place 1 drop into the right eye 2 (two) times daily.     [provider]  ferrous sulfate (IRON SUPPLEMENT) 325 (65 FE) MG tablet Take 325 mg by mouth 2 (two) times daily with a meal. LUNCH and SUPPER    [provider]  furosemide (LASIX) 80 MG tablet Take 80 mg by mouth daily.     [provider]  isosorbide mononitrate (IMDUR) 30 MG 24 hr tablet Take 3 tablets (90 mg total) by mouth daily. 09/22/17   Geradine Girt, DO  isosorbide mononitrate (IMDUR) 30 MG 24 hr tablet Take 3 tablets (90 mg total) by mouth daily. 10/07/17   Forde Dandy, MD  latanoprost (XALATAN) 0.005 % ophthalmic solution Place 1 drop into both eyes daily. 5 PM    [provider]  metoprolol tartrate (LOPRESSOR) 100 MG tablet Take 100 mg by mouth 2 (two) times daily. LUNCH and BEDTIME    [provider]    Family History Family History  Problem Relation Age of Onset  . Cancer Mother        BRAIN    Social History Social History   Tobacco Use  . Smoking status: Former Smoker    Packs/day: 2.00    Years: 40.00    Pack years: 80.00    Types: Cigarettes, Pipe    Last attempt to quit: 03/22/2006    Years since quitting: 11.5  . Smokeless tobacco: Never Used  . Tobacco comment: Stopped smoking 10  years ago.  Substance Use Topics  . Alcohol use: Yes    Alcohol/week: 19.2 oz    Types: 25 Glasses of wine, 7 Cans of beer per week  . Drug use: No     Allergies   Patient has no known allergies.   Review of Systems Review of Systems  All other systems reviewed and are negative.    Physical Exam Updated Vital Signs BP (!) 130/57 (BP Location: Right Arm)   Pulse 84   Temp 98.3 F (36.8 C) (Oral)   SpO2 98%   Physical Exam  Nursing note and vitals reviewed.  76 year old male, resting comfortably and in no acute distress. Vital signs are normal. Oxygen saturation is 98%, which is normal. Head is normocephalic and atraumatic. PERRLA, EOMI. Oropharynx is clear. Neck is nontender and supple without adenopathy or JVD. Back is nontender and there is no CVA tenderness. Lungs are clear without rales, wheezes, or rhonchi. Chest is nontender. Heart has regular rate and rhythm without murmur. Abdomen is soft, flat, nontender without masses or hepatosplenomegaly and peristalsis is normoactive.  Ileal conduit present in the right lower quadrant. Extremities have no cyanosis or edema, full range of motion is present. Skin is warm and dry without rash. Neurologic: Mental status is normal, cranial nerves are intact, there are no motor or sensory deficits.  ED Treatments / Results  Labs (all labs ordered are listed, but only abnormal results are displayed) Labs Reviewed  BASIC METABOLIC PANEL - Abnormal; Notable for the following components:      Result Value   Chloride 99 (*)    CO2 20 (*)    BUN 119 (*)    Creatinine, Ser 8.00 (*)    GFR calc non Af Amer 6 (*)    GFR calc Af Amer 7 (*)    Anion gap 17 (*)    All other components within normal limits  CBC - Abnormal; Notable for the following components:   RBC 3.11 (*)    Hemoglobin 9.3 (*)    HCT 28.3 (*)    RDW 19.5 (*)    All other components within normal limits  HEMOGLOBIN AND HEMATOCRIT, BLOOD  I-STAT TROPONIN, ED   I-STAT TROPONIN, ED    EKG  EKG Interpretation  Date/Time:  Friday October 21 2017 05:21:08 EST Ventricular Rate:  97 PR Interval:    QRS Duration: 142 QT Interval:  387 QTC Calculation: 492 R Axis:   -117 Text Interpretation:  Sinus rhythm Premature ventricular complexes RBBB and LAFB Borderline ST depression, lateral leads When compared with ECG of 10/07/2017, Premature ventricular complexes are now present Confirmed by Delora Fuel ([id number]) on 10/21/2017 5:24:07 AM  Radiology Dg Chest 2 View  Result Date: 10/21/2017 CLINICAL DATA:  76 y/o  M; chest pain and shortness of breath. EXAM: CHEST  2 VIEW COMPARISON:  09/20/2017 chest radiograph FINDINGS: Normal cardiac silhouette. Aortic atherosclerosis with calcification. No focal consolidation. No pleural effusion or pneumothorax. No acute osseous abnormality is evident. IMPRESSION: No acute pulmonary process identified. Electronically Signed   By: Kristine Garbe M.D.   On: 10/21/2017 05:55    Procedures Procedures (including critical care time)  Medications Ordered in ED Medications - No data to display   Initial Impression / Assessment and Plan / ED Course  I have reviewed the triage vital signs and the nursing notes.  Pertinent labs & imaging results that were available during my care of the patient were reviewed by me and considered in my medical decision making (see chart for details).  Chest pain in patient with history of end-stage renal disease coronary artery disease who gets chest pain when hemoglobin drops.  Old records are reviewed showing several recent ED visits for chest pain which were treated with blood transfusions.  Sometimes he is admitted to the hospital for transfusions, sometimes transfusions are given in the ED.  ECG today shows no acute changes.  Initial troponin is normal.  Hemoglobin is come back at 9.3.  This is not a level that should be associated with angina.  I have explained this  to the patient.  He distrusts the lab value.  On closer review of past records, he had an ED visit on November 20 where hemoglobin was also in this range.  One week later, hemoglobin had dropped to 7.8.  He will be kept in the ED for delta troponin and will recheck hemoglobin with delta troponin.  Case is signed out to Dr. Jeanell Sparrow.  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

## 2017-10-21 NOTE — Progress Notes (Signed)
This is an addendum to the H&P dictated by [employer] Practionner Sharene Butters.  Matthew Mays is a 76 y.o. male with medical history significant for CKD stage V, COPD on home oxygen, anemia of chronic disease, history of sinus tachycardia, hypertension, history of mitral regurgitation and aortic regurgitation, history of NSTEMI, prediabetes, secondary hyperparathyroidism, history of bladder cancer with urostomy, recently admitted for chest pain, now presenting again with chest pain and tightness, across his chest. Onset 0300. At the time of this evaluation his chest pain had resolved.  Patient has poor insight about his medical condition although he is alert and oriented x 3. Told the APP he wanted to be DNR. Now he wants Full code. States his daughter is his medical POA.  Agree with assessment and plan done by APP.

## 2017-10-21 NOTE — ED Notes (Signed)
Pt resting in stretcher. Alert and oriented. Denies pain or discomfort. Aware and agrees with poc.

## 2017-10-21 NOTE — ED Provider Notes (Addendum)
76 y.o. esrd refusing dialysis, here with chest pain which he thought secondary to anemia, hgb at 9- which patient did not believe.  Creatinine 8 at baseline.  Patient with normal troponin, unchanged ekg.  Plan repeat troponin, history of frequent evaluations for chest pain. dRepeat troponin borderline at .08, will send lab trop.  However, reviewed patient's chart with recent admission for chest pain in November.  Echo at that time.  Cardiology note- see below.       Chest pain, elevated troponin with EKG changes Medical management without cath per his request.  Imdur added yesterday.  No change in meds.     HTN BP is OK.  He would tolerate increased Imdur if he has recurrent pain.     We will sign off.  Please call with further questions.     Signed, Minus Breeding, MD  08/23/2017, 9:53 AM      76 year old man renal failure, history of an n STEMI presents today with substernal chest pain.  Troponins are borderline at 0.07 with repeat 0.08.  Patient's pain had resolved but has returned.  We are placing Nitropaste.  Plan admission for further treatment.  Patient states that he wants medical management of his heart disease and does not think he would want cardiac catheterization but was not entirely sure about this.  However he does not want dialysis  Discussed with [facility] and will request cardiology consult  Pattricia Boss, MD 10/21/17 [id number]    Pattricia Boss, MD 10/21/17 [phone]

## 2017-10-21 NOTE — ED Triage Notes (Signed)
Patient arrived with EMS from home reports central chest pain with mild SOB onset 4 am this mild SOB , no nausea or diaphoresis , he took ASA 324 mg prior to arrival , denies chest pain at arrival .

## 2017-10-22 DIAGNOSIS — N186 End stage renal disease: Secondary | ICD-10-CM | POA: Diagnosis not present

## 2017-10-22 DIAGNOSIS — N185 Chronic kidney disease, stage 5: Secondary | ICD-10-CM

## 2017-10-22 DIAGNOSIS — Z79899 Other long term (current) drug therapy: Secondary | ICD-10-CM | POA: Diagnosis not present

## 2017-10-22 DIAGNOSIS — Z515 Encounter for palliative care: Secondary | ICD-10-CM

## 2017-10-22 DIAGNOSIS — I214 Non-ST elevation (NSTEMI) myocardial infarction: Secondary | ICD-10-CM | POA: Diagnosis not present

## 2017-10-22 DIAGNOSIS — D631 Anemia in chronic kidney disease: Secondary | ICD-10-CM | POA: Diagnosis not present

## 2017-10-22 DIAGNOSIS — Z8551 Personal history of malignant neoplasm of bladder: Secondary | ICD-10-CM | POA: Diagnosis not present

## 2017-10-22 DIAGNOSIS — J449 Chronic obstructive pulmonary disease, unspecified: Secondary | ICD-10-CM | POA: Diagnosis present

## 2017-10-22 DIAGNOSIS — I5030 Unspecified diastolic (congestive) heart failure: Secondary | ICD-10-CM | POA: Diagnosis present

## 2017-10-22 DIAGNOSIS — N179 Acute kidney failure, unspecified: Secondary | ICD-10-CM | POA: Diagnosis present

## 2017-10-22 DIAGNOSIS — Z9115 Patient's noncompliance with renal dialysis: Secondary | ICD-10-CM | POA: Diagnosis not present

## 2017-10-22 DIAGNOSIS — Z6823 Body mass index (BMI) 23.0-23.9, adult: Secondary | ICD-10-CM | POA: Diagnosis not present

## 2017-10-22 DIAGNOSIS — R079 Chest pain, unspecified: Secondary | ICD-10-CM | POA: Diagnosis present

## 2017-10-22 DIAGNOSIS — R627 Adult failure to thrive: Secondary | ICD-10-CM | POA: Diagnosis not present

## 2017-10-22 DIAGNOSIS — N2581 Secondary hyperparathyroidism of renal origin: Secondary | ICD-10-CM | POA: Diagnosis present

## 2017-10-22 DIAGNOSIS — I132 Hypertensive heart and chronic kidney disease with heart failure and with stage 5 chronic kidney disease, or end stage renal disease: Secondary | ICD-10-CM | POA: Diagnosis present

## 2017-10-22 DIAGNOSIS — D649 Anemia, unspecified: Secondary | ICD-10-CM | POA: Diagnosis not present

## 2017-10-22 DIAGNOSIS — I252 Old myocardial infarction: Secondary | ICD-10-CM | POA: Diagnosis not present

## 2017-10-22 DIAGNOSIS — R0789 Other chest pain: Secondary | ICD-10-CM

## 2017-10-22 DIAGNOSIS — Z936 Other artificial openings of urinary tract status: Secondary | ICD-10-CM | POA: Diagnosis not present

## 2017-10-22 DIAGNOSIS — I2510 Atherosclerotic heart disease of native coronary artery without angina pectoris: Secondary | ICD-10-CM | POA: Diagnosis present

## 2017-10-22 DIAGNOSIS — R7303 Prediabetes: Secondary | ICD-10-CM | POA: Diagnosis present

## 2017-10-22 DIAGNOSIS — D638 Anemia in other chronic diseases classified elsewhere: Secondary | ICD-10-CM | POA: Diagnosis present

## 2017-10-22 DIAGNOSIS — Z9981 Dependence on supplemental oxygen: Secondary | ICD-10-CM | POA: Diagnosis not present

## 2017-10-22 DIAGNOSIS — Z8546 Personal history of malignant neoplasm of prostate: Secondary | ICD-10-CM | POA: Diagnosis not present

## 2017-10-22 DIAGNOSIS — Z87891 Personal history of nicotine dependence: Secondary | ICD-10-CM | POA: Diagnosis not present

## 2017-10-22 LAB — COMPREHENSIVE METABOLIC PANEL
ALBUMIN: 2.7 g/dL — AB (ref 3.5–5.0)
ALK PHOS: 56 U/L (ref 38–126)
ALT: 10 U/L — AB (ref 17–63)
ANION GAP: 13 (ref 5–15)
AST: 9 U/L — ABNORMAL LOW (ref 15–41)
BUN: 133 mg/dL — ABNORMAL HIGH (ref 6–20)
CALCIUM: 8.5 mg/dL — AB (ref 8.9–10.3)
CHLORIDE: 103 mmol/L (ref 101–111)
CO2: 19 mmol/L — AB (ref 22–32)
CREATININE: 8.47 mg/dL — AB (ref 0.61–1.24)
GFR calc Af Amer: 6 mL/min — ABNORMAL LOW (ref >60)
GFR calc non Af Amer: 5 mL/min — ABNORMAL LOW (ref >60)
GLUCOSE: 99 mg/dL (ref 65–99)
Potassium: 5.1 mmol/L (ref 3.5–5.1)
SODIUM: 135 mmol/L (ref 135–145)
Total Bilirubin: 0.5 mg/dL (ref 0.3–1.2)
Total Protein: 6.1 g/dL — ABNORMAL LOW (ref 6.5–8.1)

## 2017-10-22 LAB — CBC
HCT: 25.2 % — ABNORMAL LOW (ref 39.0–52.0)
HEMOGLOBIN: 8.2 g/dL — AB (ref 13.0–17.0)
MCH: 29.5 pg (ref 26.0–34.0)
MCHC: 32.5 g/dL (ref 30.0–36.0)
MCV: 90.6 fL (ref 78.0–100.0)
Platelets: 143 10*3/uL — ABNORMAL LOW (ref 150–400)
RBC: 2.78 MIL/uL — AB (ref 4.22–5.81)
RDW: 19.2 % — ABNORMAL HIGH (ref 11.5–15.5)
WBC: 7.2 10*3/uL (ref 4.0–10.5)

## 2017-10-22 LAB — PREPARE RBC (CROSSMATCH)

## 2017-10-22 MED ORDER — NITROGLYCERIN 0.4 MG SL SUBL: 0.4000 mg | SUBLINGUAL_TABLET | SUBLINGUAL | Status: DC | PRN

## 2017-10-22 MED ORDER — SODIUM CHLORIDE 0.9 % IV SOLN
Freq: Once | INTRAVENOUS | Status: AC
  Administered 2017-10-22: 17:00:00 via INTRAVENOUS

## 2017-10-22 NOTE — Consult Note (Signed)
Consultation Note Date: 10/22/2017   Patient Name: Matthew Mays  DOB: December 27, 1940  MRN: [id number]  Age / Sex: 76 y.o., male  PCP: Matthew Emms, MD Referring Physician: Florencia Reasons, MD  Reason for Consultation: Establishing goals of care and Psychosocial/spiritual support  HPI/Patient Profile: 76 y.o. male  with past medical history of end-stage renal disease not on hemodialysis, COPD on oxygen at home, hypertension, mitral valve regurgitation, aortic valve regurgitation, non-STEMI, diastolic heart failure grade 1, history of bladder cancer, admitted on 10/21/2017 with chest pain.  Patient has had several admissions recently for complaints of chest pain.  In the past he has refused a cardiac cath because of concerns of making his renal function worse.  He continues to refuse hemodialysis and in fact is stating that he wants to sever medical services with nephrology and establish with a primary care provider who can provide home visits as well as outpatient transfusions.  Consult ordered for goals of care.   Clinical Assessment and Goals of Care: Patient seen, chart reviewed.  No family at the bedside.  Patient has been seen by palliative medicine providers in November 2018.  At that time he had elected  DNR however he has now reversed his CODE STATUS to full code.  I did review with him again and define full code versus DNR.  He does state now he would want them to attempt resuscitation including CPR, defibrillation, and intubation.  He states he would not want a trach or PEG, "or live on machines like a vegetable".  In the past he has refused a cardiac cath because of concerns for worsening his already end-stage renal failure but he does seem to be less adamant at this point about refusing a cardiac cath.  He tells me " I would only do it if it was an emergency".  I explained to him that with his recent admissions with  ongoing chest pain those are the indications for a cardiac cath.  He explained further  "I would have to be having a heart attack".  I did tell him that his EKG is showing  Changes, that it looks as though he has had an old heart attack as well as now a right bundle branch block.    He tells me quality versus quantity is what is important to him.  I did ask him to do fine quality of life, share some of the things that he enjoys.  Mr. Matthew Mays, states he enjoys research on both kidney failure as well as artificial intelligence, software and Psychologist, educational, as well as physics  Patient at this point is capable of making his own decisions.  In the event that he were unable to speak for himself, his daughter, Matthew Mays, is his healthcare power of attorney.  Patient states that he has an appointment with a new primary care provider, Dr. Calton Mays on October 28, 2017.  It remains his hope that he can continue with transfusions but would like to be able to avoid hospitalization and  receive transfusions on an outpatient basis. He states he needs transfusions when his Hgb is "in the low 8's" .  He verbalizes that transfusions provide control his shortness of breath and chest pain and improves his energy.  I did share with him that there will be an endpoint to his ability to receive transfusions because of an increasing risk of a transfusion reaction as well as the formation of antibodies.  He informs me that he is aware of this. He also does not want his diarrhea improved.  He believes that his ongoing diarrhea is what has helped him manage his chronic kidney disease stage V for the past "9 years" by keeping his potassium levels within normal limits; "I believe my gut is doing what dialysis would do for my kidneys".    SUMMARY OF RECOMMENDATIONS   Confirmed that patient wishes to be a full code including intubation At this point he is stating that he would not want to remain on life support and would refuse a  trach/PEG I am not convinced that he would refuse a cardiac cath at this point.  He sounds less adamant than he has is in the past regarding this He wishes to continue transfusions which are now averaging approximately every 2 weeks.  Ideally if he could be associated with a community based practice that could see him medically, draw labs and transfuse him on an outpatient basis this would help minimize inpatient hospitalizations which would be patient's desire He feels as though he needs a transfusion at this point Will place a consult to social work to see if such a practice exists ([facility]?) Code Status/Advance Care Planning:  Full code    Symptom Management:   Dyspnea: Needs improvement.  Continue with morphine as needed as well as Xanax for associated anxiety  Palliative Prophylaxis:   Aspiration, Delirium Protocol, Eye Care, Frequent Pain Assessment, Oral Care and Turn Reposition  Additional Recommendations (Limitations, Scope, Preferences):  Full Scope Treatment and No Hemodialysis  Psycho-social/Spiritual:   Desire for further Chaplaincy support:no  Additional Recommendations: Referral to Community Resources   Prognosis:   Unable to determine  Discharge Planning: To Be Determined      Primary Diagnoses: Present on Admission: . End stage renal disease ([place]) . Hyperglycemia . Hypertension . AKI (acute kidney injury) ([place]) . Anemia . Glaucoma . Elevated troponin . Symptomatic anemia . Non-ST elevation (NSTEMI) myocardial infarction ([place]) . Chest discomfort . CKD (chronic kidney disease), stage V ([place]) . Chest pain   I have reviewed the medical record, interviewed the patient and family, and examined the patient. The following aspects are pertinent.  Past Medical History:  Diagnosis Date  . Anemia   . Aortic regurgitation 08/13/2017   moderate  . Aortic stenosis 08/13/2017   mild  . Bladder cancer ([place])   . Chronic kidney disease   . H/O sinus  tachycardia   . Hypertension   . Mitral regurgitation 08/13/2017   mild  . Non-ST elevation (NSTEMI) myocardial infarction ([place])   . Prediabetes   . Secondary hyperparathyroidism ([place])    Social History   Socioeconomic History  . Marital status: Widowed    Spouse name: None  . Number of children: None  . Years of education: None  . Highest education level: None  Social Needs  . Financial resource strain: None  . Food insecurity - worry: None  . Food insecurity - inability: None  . Transportation needs - medical: None  . Transportation needs - non-medical:  None  Occupational History  . None  Tobacco Use  . Smoking status: Former Smoker    Packs/day: 2.00    Years: 40.00    Pack years: 80.00    Types: Cigarettes, Pipe    Last attempt to quit: 03/22/2006    Years since quitting: 11.5  . Smokeless tobacco: Never Used  . Tobacco comment: Stopped smoking 10 years ago.  Substance and Sexual Activity  . Alcohol use: Yes    Alcohol/week: 19.2 oz    Types: 25 Glasses of wine, 7 Cans of beer per week  . Drug use: No  . Sexual activity: No  Other Topics Concern  . None  Social History Narrative  . None   Family History  Problem Relation Age of Onset  . Cancer Mother        BRAIN   Scheduled Meds: . calcium carbonate  3 tablet Oral BID  . dorzolamide  1 drop Right Eye BID  . feeding supplement (ENSURE ENLIVE)  237 mL Oral BID BM  . ferrous sulfate  325 mg Oral BID AC  . furosemide  80 mg Oral Daily  . heparin  5,000 Units Subcutaneous Q8H  . isosorbide mononitrate  90 mg Oral Daily  . latanoprost  1 drop Both Eyes q1800  . metoprolol tartrate  100 mg Oral 2 times per day   Continuous Infusions: PRN Meds:.ALPRAZolam, bisacodyl, gi cocktail, HYDROcodone-acetaminophen, morphine injection, nitroGLYCERIN, ondansetron (ZOFRAN) IV, senna-docusate Medications Prior to Admission:  Prior to Admission medications   Medication Sig Start Date End Date Taking? Authorizing  Provider  calcium carbonate (TUMS EX) 750 MG chewable tablet Chew 3 tablets by mouth 2 (two) times daily. LUNCH and SUPPER   Yes [provider]  dorzolamide (TRUSOPT) 2 % ophthalmic solution Place 1 drop into the right eye 2 (two) times daily.    Yes [provider]  ferrous sulfate (IRON SUPPLEMENT) 325 (65 FE) MG tablet Take 325 mg by mouth 2 (two) times daily with a meal. LUNCH and SUPPER   Yes [provider]  furosemide (LASIX) 80 MG tablet Take 80 mg by mouth daily.    Yes [provider]  isosorbide mononitrate (IMDUR) 30 MG 24 hr tablet Take 3 tablets (90 mg total) by mouth daily. 09/22/17  Yes Vann, Jessica U, DO  latanoprost (XALATAN) 0.005 % ophthalmic solution Place 1 drop into both eyes daily. 5 PM   Yes [provider]  metoprolol tartrate (LOPRESSOR) 100 MG tablet Take 100 mg by mouth 2 (two) times daily. LUNCH and BEDTIME   Yes [provider]  isosorbide mononitrate (IMDUR) 30 MG 24 hr tablet Take 3 tablets (90 mg total) by mouth daily. Patient not taking: Reported on 10/21/2017 10/07/17   Forde Dandy, MD   No Known Allergies Review of Systems  Unable to perform ROS: Other    Physical Exam  Constitutional: He is oriented to person, place, and time.  Older man, mild shortness of breath noted at rest  HENT:  Head: Normocephalic and atraumatic.  Neck: Normal range of motion.  Cardiovascular: Normal rate.  Pulmonary/Chest:  Mild increased work of breathing noted at rest with conversation  Musculoskeletal: Normal range of motion.  Neurological: He is alert and oriented to person, place, and time.  Skin: Skin is warm and dry.  Psychiatric: He has a normal mood and affect. His behavior is normal. Thought content normal.  Nursing note and vitals reviewed.   Vital Signs: BP (!) 108/45 (BP  Location: Right Arm)   Pulse 81   Temp 97.9 F (36.6 C) (Oral)   Resp (!) 21   Ht 5\' 10"  (1.778 m)   Wt 74.9 kg (165 lb 2 oz)    SpO2 99%   BMI 23.69 kg/m  Pain Assessment: No/denies pain   Pain Score: 4    SpO2: SpO2: 99 % O2 Device:SpO2: 99 % O2 Flow Rate: .   IO: Intake/output summary:   Intake/Output Summary (Last 24 hours) at 10/22/2017 0955 Last data filed at 10/22/2017 0600 Gross per 24 hour  Intake 240 ml  Output 1225 ml  Net -985 ml    LBM: Last BM Date: 10/21/17 Baseline Weight: Weight: 74.9 kg (165 lb 2 oz) Most recent weight: Weight: 74.9 kg (165 lb 2 oz)     Palliative Assessment/Data:   Flowsheet Rows     Most Recent Value  Intake Tab  Referral Department  Hospitalist  Unit at Time of Referral  Med/Surg Unit  Palliative Care Primary Diagnosis  Nephrology  Date Notified  10/21/17  Palliative Care Type  Return patient Palliative Care  Reason for referral  Clarify Goals of Care  Date of Admission  10/21/17  Date first seen by Palliative Care  10/22/17  # of days Palliative referral response time  1 Day(s)  # of days IP prior to Palliative referral  0  Clinical Assessment  Palliative Performance Scale Score  50%  Pain Max last 24 hours  Not able to report  Pain Min Last 24 hours  Not able to report  Dyspnea Max Last 24 Hours  Not able to report  Dyspnea Min Last 24 hours  Not able to report  Nausea Max Last 24 Hours  Not able to report  Nausea Min Last 24 Hours  Not able to report  Anxiety Max Last 24 Hours  Not able to report  Anxiety Min Last 24 Hours  Not able to report  Other Max Last 24 Hours  Not able to report  Psychosocial & Spiritual Assessment  Palliative Care Outcomes  Patient/Family meeting held?  Yes  Who was at the meeting?  pt      Time In: 0900 Time Out: 1010 Time Total: 70 min Greater than 50%  of this time was spent counseling and coordinating care related to the above assessment and plan.  Signed by: Dory Horn, NP   Please contact Palliative Medicine Team phone at [phone] for questions and concerns.  For individual provider: See  Shea Evans

## 2017-10-22 NOTE — Progress Notes (Signed)
PROGRESS NOTE  Matthew Mays NTI:[id number] DOB: 14-Jul-1941 DOA: 10/21/2017 PCP: Estanislado Emms, MD  HPI/Recap of past 24 hours:  He reports feeling better at rest, he reports need prbc transfusion, otherwise , he develop angina on exertion  Assessment/Plan: Active Problems:   End stage renal disease ([place])   Hyperglycemia   Hypertension   AKI (acute kidney injury) ([place])   CKD (chronic kidney disease), stage V (HCC)   Anemia   Glaucoma   Elevated troponin   Chest discomfort   Chest pain   Non-ST elevation (NSTEMI) myocardial infarction ([place])   Symptomatic anemia  Chest pain with known CAD h/o NSTEMI, he is not a candidate for cath due to he does not want t obe on dialysis. Supportive measures and prbc transfusion.  Progressive CKDV/ESRD: He does not want dialysis, he think his diarrhea is taking care of the volume and potassium issues. He reports taking sodium bicarb for acidosis. He is aware progressive bun could lead to confusion/coma and death.  Anemia of chronic diease: He reports one unit blood will last for aweek, he is in the process of arranging outpatient blood transfusion to avoid rehospitalization. He is aware of he might develop antibodies due to frequent blood transfusion, that he eventually may have difficulty to find blood to transfuse.   FTT: he is aware of his continuing decline, he is in the process of arranged doctor to make home visit. (Dr Daphene Jaeger [phone]) He is also looking for reliable transport to get outpatient blood transfusion. He is interested in continue follow with community home health and hospice agency. He remains full code, he states he does not want to be on life support for prolonged period of time but short duration is ok.   Code Status: FULL  Family Communication: patient and daughter over the phone  Disposition Plan: home with home health and continue follow with community home health and hospice agency Need to establish outpatient  prbc transfusion   Consultants: Palliative care  Case manger  Social worker  Procedures:  prbc transfusion  Antibiotics:  none   Objective: BP (!) 108/45 (BP Location: Right Arm)   Pulse 81   Temp 97.9 F (36.6 C) (Oral)   Resp (!) 21   Ht 5\' 10"  (1.778 m)   Wt 74.9 kg (165 lb 2 oz)   SpO2 99%   BMI 23.69 kg/m   Intake/Output Summary (Last 24 hours) at 10/22/2017 0849 Last data filed at 10/22/2017 0600 Gross per 24 hour  Intake 240 ml  Output 1225 ml  Net -985 ml   Filed Weights   10/21/17 1400  Weight: 74.9 kg (165 lb 2 oz)    Exam: Patient is examined daily including today on 10/22/2017, exams remain the same as of yesterday except that has changed    General:  NAD  Cardiovascular: RRR  Respiratory: CTABL  Abdomen: Soft/ND/NT, positive BS  Musculoskeletal: No Edema  Neuro: alert, oriented   Data Reviewed: Basic Metabolic Panel: Recent Labs  Lab 10/21/17 0527 10/22/17 0310  NA 136 135  K 5.0 5.1  CL 99* 103  CO2 20* 19*  GLUCOSE 90 99  BUN 119* 133*  CREATININE 8.00* 8.47*  CALCIUM 8.9 8.5*   Liver Function Tests: Recent Labs  Lab 10/22/17 0310  AST 9*  ALT 10*  ALKPHOS 56  BILITOT 0.5  PROT 6.1*  ALBUMIN 2.7*   No results for input(s): LIPASE, AMYLASE in the last 168 hours. No results for  input(s): AMMONIA in the last 168 hours. CBC: Recent Labs  Lab 10/21/17 0527 10/21/17 0830 10/22/17 0310  WBC 7.7  --  7.2  HGB 9.3* 9.4* 8.2*  HCT 28.3* 29.1* 25.2*  MCV 91.0  --  90.6  PLT 151  --  143*   Cardiac Enzymes:   Recent Labs  Lab 10/21/17 1158 10/21/17 1514 10/21/17 1833  TROPONINI 0.07* 0.06* 0.06*   BNP (last 3 results) Recent Labs    03/16/17 0259 07/21/17 1416  BNP 680.7* 761.6*    ProBNP (last 3 results) No results for input(s): PROBNP in the last 8760 hours.  CBG: No results for input(s): GLUCAP in the last 168 hours.  No results found for this or any previous visit (from the past 240  hour(s)).   Studies: No results found.  Scheduled Meds: . calcium carbonate  3 tablet Oral BID  . dorzolamide  1 drop Right Eye BID  . feeding supplement (ENSURE ENLIVE)  237 mL Oral BID BM  . ferrous sulfate  325 mg Oral BID AC  . furosemide  80 mg Oral Daily  . heparin  5,000 Units Subcutaneous Q8H  . isosorbide mononitrate  90 mg Oral Daily  . latanoprost  1 drop Both Eyes q1800  . metoprolol tartrate  100 mg Oral 2 times per day    Continuous Infusions:   Time spent: 47mins, I have a long conversation regarding goals of care with the patient in room and his daughter over the phone. I have personally reviewed and interpreted on  10/22/2017 daily labs, tele strips, imagings as discussed above under date review session and assessment and plans.  I reviewed all nursing notes, pharmacy notes, consultant notes,  vitals, pertinent old records  I have discussed plan of care as described above with RN , patient and family on 10/22/2017   Florencia Reasons MD, PhD  [facility] Pager [phone]. If 7PM-7AM, please contact night-coverage at [email or web address], password [facility] 10/22/2017, 8:49 AM  LOS: 0 days

## 2017-10-22 NOTE — Plan of Care (Signed)
Supplements given for nutrition. Taking PO. VSS. SR in 80's with [place] PVC's. Pulse Rate in 40'S.

## 2017-10-22 NOTE — Progress Notes (Signed)
C/O CP. Pt concerned that Hgb = 8.2. Appears anxious. Dr. Erlinda Hong notified. Order received for NTG sl. Pt refused NTG and Xanax. States CP resolved on it's own.

## 2017-10-23 LAB — CBC
HCT: 34.4 % — ABNORMAL LOW (ref 39.0–52.0)
Hemoglobin: 11.4 g/dL — ABNORMAL LOW (ref 13.0–17.0)
MCH: 29.5 pg (ref 26.0–34.0)
MCHC: 33.1 g/dL (ref 30.0–36.0)
MCV: 88.9 fL (ref 78.0–100.0)
PLATELETS: 141 10*3/uL — AB (ref 150–400)
RBC: 3.87 MIL/uL — AB (ref 4.22–5.81)
RDW: 18 % — AB (ref 11.5–15.5)
WBC: 8.6 10*3/uL (ref 4.0–10.5)

## 2017-10-23 LAB — TYPE AND SCREEN
ABO/RH(D): A POS
ANTIBODY SCREEN: NEGATIVE
UNIT DIVISION: 0
Unit division: 0

## 2017-10-23 LAB — BPAM RBC
BLOOD PRODUCT EXPIRATION DATE: 201901262359
Blood Product Expiration Date: 201901262359
ISSUE DATE / TIME: 201812292205
ISSUE DATE / TIME: 201812291811
UNIT TYPE AND RH: 6200
Unit Type and Rh: 6200

## 2017-10-23 MED ORDER — SODIUM BICARBONATE 650 MG PO TABS: 650.0000 mg | ORAL_TABLET | Freq: Two times a day (BID) | ORAL | 1 refills | Status: DC

## 2017-10-23 NOTE — Progress Notes (Signed)
CSW was contacted by RN to assisted with transportation needs.  CSW provided resources to pt for possible transportation needs.  Pt was appreciative of CSW's involvement.  No further needs identify.  CSW signing off.  Reed Breech LCSWA [phone]

## 2017-10-23 NOTE — Care Management Note (Signed)
Case Management Note  Patient Details  Name: Matthew Mays MRN: [id number] Date of Birth: May 06, 1941  Subjective/Objective:         Pt presented for CP after recent admission for same.  Pt has refused dialysis for several years and has chronic diarrhea.  He has requested to have blood transfusions every 2 weeks until the end of life with the knowledge that he will not recover from his current state of health.        He wants to change PMD to a doctor/NP who can make home visits.  He has an appointment for a NP to come visit him on 1/3  From Dr. Altamese Dilling office.  Pt states he has several phone numbers to follow up on this.    Pt also asks that we set up transportation to his blood transfusion appointments.    Dr. Erlinda Hong would like for pt to be referred to a transfusion clinic with a MD who can manage transfusions.    Pt has [facility] coming out 2x's/wk to check VS.  Pt states he does not want to continue this.  Pt has been in touch with Bambi from [facility].  Previous PMD would not sigh order for Hospice, so services terminated.    Action/Plan:  Unable to get in touch with [facility] clinic MD (Dr. Doreene Burke [phone]) as Dr. Erlinda Hong requested.  Also paged MD covering for [facility] without return call.    Bambi of [facility] contacted and will call pt tomorrow.  She will ensure transition to new PMD and will arrange bi-weekly transfusions through Dr. Daphene Jaeger.    CSW, Baxter Flattery, offered pt resources for transportation to transfusion appointments.  Written order for Short Stay center blood transfusion completed by Dr. Erlinda Hong and faxed.  Order placed on chart.   Pt verbalizes understanding of all of above.   Expected Discharge Date:  10/23/17               Expected Discharge Plan:  [place]  In-House Referral:  Clinical Social Work  Discharge planning Services  CM Consult  Post Acute Care Choice:  Home health Choice offered to:  Patient  DME  Arranged:  N/A DME Agency:  NA  HH Arranged:  RN [place] Agency:  [place]  Status of Service:  Completed, signed off  If discussed at [employer] of Stay Meetings, dates discussed:    Additional Comments:  Arley Phenix, RN 10/23/2017, 2:36 PM

## 2017-10-23 NOTE — Discharge Summary (Addendum)
Discharge Summary  Matthew Mays VVO:[id number] DOB: Mar 29, 1941  PCP: Clovia Cuff, MD  Dr Daphene Jaeger for home visit  Admit date: 10/21/2017 Discharge date: 10/23/2017  Time spent: >17mins, more than 50% time spent on coordination of care.  Recommendations for Outpatient Follow-up:  1. Case manager consulted to help set up ongoing outpatient prbc transfusion qweekly or q2 weeks  2. Patient now want a primary care physician who can make home visit, he is referred to Dr Daphene Jaeger 3. community home health and hospice agency continue to follow patient 4. Patient and family is trying to avoid calling EMS and trying to avoid rehospitalizations.  Discharge Diagnoses:  Active Hospital Problems   Diagnosis Date Noted  . Chest pain 10/22/2017  . 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 Non-ST elevation (NSTEMI) myocardial infarction ([place])   . Atypical chest pain 08/12/2017  . Elevated troponin 07/22/2017  . Chest discomfort 07/22/2017  . Anemia 07/21/2017  . Glaucoma 07/21/2017  . Hyperglycemia 06/19/2016  . Hypertension 06/19/2016  . AKI (acute kidney injury) ([place]) 06/19/2016  . CKD (chronic kidney disease), stage V ([place]) 06/19/2016  . End stage renal disease ([place]) 03/22/2012    Resolved Hospital Problems  No resolved problems to display.    Discharge Condition: stable  Diet recommendation: heart healthy/carb modified/ renal diet  Filed Weights   10/21/17 1400 10/23/17 0434  Weight: 74.9 kg (165 lb 2 oz) 74.9 kg (165 lb 3.2 oz)    History of present illness:  Patient coming from:  Home    Chief Complaint: Chest pain  HPI: Matthew Mays is a 76 y.o. male with medical history significant for CKD stage V, COPD on home oxygen, anemia of chronic disease, history of sinus tachycardia, hypertension, history of mitral regurgitation and aortic regurgitation, history of N STEMI, prediabetes, secondary hyperparathyroidism, history of bladder cancer on  permanent cath, recently admitted for chest pain, now presenting again with chest pain and tightness, across his chest, since 3:00 this morning, resolved after taking 4 baby aspirin.  On arrival, the patient was initially symptom-free, chest pain returning, requiring Nitropaste, without further recurrence.  He denies any increasing shortness of breath.  He denies any nausea or diaphoresis.  He denies any radiation to the jaw or to the left arm.  He reports the pain being similar to that of prior admission.  Denies any bleeding issues.  He denies any cough.  He makes very little urine, but he denies any dysuria or hematuria.  Denies any lower extremity swelling or calf pain.  During prior admission, the patient has refused any procedures such as cardiac catheterization, despite elevated troponins, and abnormal EKG.  In addition, the patient has been refusing dialysis as well.  He does not wish to have any invasive procedures.  He now is requesting palliative care evaluation, for goals of care.  He is seeking help in medical management only.  He is compliant with his medications.   ED Course:  BP (!) 137/51   Pulse 79   Temp 98.3 F (36.8 C) (Oral)   Resp 14   SpO2 100%   At the ED, his chest pain return, placed on Nitropaste with relief of the symptoms. 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 Supportive measures and prbc transfusion.  Progressive CKDV/ESRD: He does not want dialysis, he think his diarrhea is taking care of the volume and potassium issues. He reports taking sodium bicarb for acidosis. He is aware progressive bun could lead to confusion/coma and death.  Anemia of chronic diease: He reports one unit blood will last for aweek, he is in the process of arranging outpatient blood transfusion to avoid rehospitalization. He is aware of he might develop antibodies due to frequent blood transfusion, that he eventually may have difficulty to find blood to transfuse.   FTT: he is aware of his continuing decline, he is in the process of arranged doctor to make home visit. (Dr Daphene Jaeger [phone]) .He is also looking for reliable transport to get outpatient blood transfusion. He wants to continue to follow with community home health and hospice agency. He remains full code, he states he does not want to be on life support for a prolonged period of time but short duration is ok.   H/o bladder and prostate cancer, s/p resection and urostomy palcement  Code Status: FULL  Family Communication: patient and daughter over the phone  Disposition Plan: home with home health and continue follow with community home health and hospice agency Need to establish outpatient prbc transfusion   Consultants: Palliative care  Case manger  Social worker  Procedures:  prbc transfusionx2 units  Antibiotics:  none   Discharge Exam: BP 118/65 (BP Location: Right Arm)   Pulse 92   Temp (!) 97.5 F (36.4 C) (Oral)   Resp 19   Ht 5\' 10"  (1.778 m)   Wt 74.9 kg (165 lb 3.2 oz)   SpO2 98%   BMI 23.70 kg/m   General: pale, NAD, AAOX3 Cardiovascular: RRR Respiratory: CTABL Ab: urostomy bag with clear urine Extremity: no edema   Discharge Instructions You were cared for by a hospitalist during  your hospital stay. If you have any questions about your discharge medications or the care you received while you were in the hospital after you are discharged, you can call the unit and asked to speak with the hospitalist on call if the hospitalist that took care of you is not available. Once you are discharged, your primary care physician will handle any further medical issues. Please note that NO REFILLS for any discharge medications will be authorized once you are discharged, as it is imperative that you return to your primary care physician (or establish a relationship with a primary care physician if you do not have one) for your aftercare needs so that they can reassess your need for medications and monitor your lab values.  Discharge Instructions    Diet - low sodium heart healthy   Complete by:  As directed    Renal diet   Increase activity slowly   Complete by:  As directed      Allergies as of 10/23/2017   No Known Allergies  Medication List    TAKE these medications   calcium carbonate 750 MG chewable tablet Commonly known as:  TUMS EX Chew 3 tablets by mouth 2 (two) times daily. LUNCH and SUPPER   dorzolamide 2 % ophthalmic solution Commonly known as:  TRUSOPT Place 1 drop into the right eye 2 (two) times daily.   furosemide 80 MG tablet Commonly known as:  LASIX Take 80 mg by mouth daily.   IRON SUPPLEMENT 325 (65 FE) MG tablet Generic drug:  ferrous sulfate Take 325 mg by mouth 2 (two) times daily with a meal. LUNCH and SUPPER   isosorbide mononitrate 30 MG 24 hr tablet Commonly known as:  IMDUR Take 3 tablets (90 mg total) by mouth daily. What changed:  Another medication with the same name was removed. Continue taking this medication, and follow the directions you see here.   latanoprost 0.005 % ophthalmic solution Commonly known as:  XALATAN Place 1 drop into both eyes daily. 5 PM   metoprolol tartrate 100 MG tablet Commonly known as:  LOPRESSOR Take  100 mg by mouth 2 (two) times daily. LUNCH and BEDTIME   sodium bicarbonate 650 MG tablet Take 1 tablet (650 mg total) by mouth 2 (two) times daily.      No Known Allergies Follow-up Information    [facility] Follow up.   Specialty:  Internal Medicine Why:  please contact patient care center for outpatient blood transfusion. Contact information: [place] [phone]       follow up with Dr Daphene Jaeger Follow up.        [facility]   Specialty:  Hospice Services Why:  Bambi will call you tomorrow.   Contact information: [place] [address] [phone]        Clovia Cuff, MD Follow up.   Specialty:  Internal Medicine Why:  Dr Daphene Jaeger to make home visit Contact information: [address] [place]  [phone]            The results of significant diagnostics from this hospitalization (including imaging, microbiology, ancillary and laboratory) are listed below for reference.    Significant Diagnostic Studies: Dg Chest 2 View  Result Date: 10/21/2017 CLINICAL DATA:  75 y/o  M; chest pain and shortness of breath. EXAM: CHEST  2 VIEW COMPARISON:  09/20/2017 chest radiograph FINDINGS: Normal cardiac silhouette. Aortic atherosclerosis with calcification. No focal consolidation. No pleural effusion or pneumothorax. No acute osseous abnormality is evident. IMPRESSION: No acute pulmonary process identified. Electronically Signed   By: Kristine Garbe M.D.   On: 10/21/2017 05:55    Microbiology: No results found for this or any previous visit (from the past 240 hour(s)).   Labs: Basic Metabolic Panel: Recent Labs  Lab 10/21/17 0527 10/22/17 0310  NA 136 135  K 5.0 5.1  CL 99* 103  CO2 20* 19*  GLUCOSE 90 99  BUN 119* 133*  CREATININE 8.00* 8.47*  CALCIUM 8.9 8.5*   Liver Function Tests: Recent Labs  Lab 10/22/17 0310  AST  9*  ALT 10*  ALKPHOS 56  BILITOT 0.5  PROT 6.1*  ALBUMIN 2.7*   No results for input(s): LIPASE, AMYLASE in the last 168 hours. No results for input(s): AMMONIA in the last 168 hours. CBC: Recent Labs  Lab 10/21/17 0527 10/21/17 0830 10/22/17 0310 10/23/17 0431  WBC 7.7  --  7.2 8.6  HGB 9.3* 9.4*  8.2* 11.4*  HCT 28.3* 29.1* 25.2* 34.4*  MCV 91.0  --  90.6 88.9  PLT 151  --  143* 141*   Cardiac Enzymes: Recent Labs  Lab 10/21/17 1158 10/21/17 1514 10/21/17 1833  TROPONINI 0.07* 0.06* 0.06*   BNP: BNP (last 3 results) Recent Labs    03/16/17 0259 07/21/17 1416  BNP 680.7* 761.6*    ProBNP (last 3 results) No results for input(s): PROBNP in the last 8760 hours.  CBG: No results for input(s): GLUCAP in the last 168 hours.     Signed:  Florencia Reasons MD, PhD  [facility] 10/23/2017, 1:52 PM

## 2017-10-28 DIAGNOSIS — I10 Essential (primary) hypertension: Secondary | ICD-10-CM | POA: Diagnosis not present

## 2017-10-28 DIAGNOSIS — Z9289 Personal history of other medical treatment: Secondary | ICD-10-CM | POA: Diagnosis not present

## 2017-10-28 DIAGNOSIS — D649 Anemia, unspecified: Secondary | ICD-10-CM | POA: Diagnosis not present

## 2017-10-28 DIAGNOSIS — C679 Malignant neoplasm of bladder, unspecified: Secondary | ICD-10-CM | POA: Diagnosis not present

## 2017-10-28 DIAGNOSIS — M6281 Muscle weakness (generalized): Secondary | ICD-10-CM | POA: Diagnosis not present

## 2017-10-28 DIAGNOSIS — R601 Generalized edema: Secondary | ICD-10-CM | POA: Diagnosis not present

## 2017-10-28 DIAGNOSIS — N185 Chronic kidney disease, stage 5: Secondary | ICD-10-CM | POA: Diagnosis not present

## 2017-10-28 DIAGNOSIS — Z0000 Encounter for general adult medical examination without abnormal findings: Secondary | ICD-10-CM | POA: Diagnosis not present

## 2017-11-04 ENCOUNTER — Emergency Department (HOSPITAL_COMMUNITY)
Admission: EM | Admit: 2017-11-04 | Discharge: 2017-11-04 | Disposition: A | Discharge status: Home / Self Care | Payer: Medicare Other | Attending: Emergency Medicine | Admitting: Emergency Medicine

## 2017-11-04 ENCOUNTER — Emergency Department (HOSPITAL_COMMUNITY): Payer: Medicare Other

## 2017-11-04 ENCOUNTER — Encounter (HOSPITAL_COMMUNITY): Payer: Self-pay | Admitting: Emergency Medicine

## 2017-11-04 DIAGNOSIS — Z79899 Other long term (current) drug therapy: Secondary | ICD-10-CM | POA: Diagnosis not present

## 2017-11-04 DIAGNOSIS — Z87891 Personal history of nicotine dependence: Secondary | ICD-10-CM | POA: Diagnosis not present

## 2017-11-04 DIAGNOSIS — N186 End stage renal disease: Secondary | ICD-10-CM | POA: Diagnosis not present

## 2017-11-04 DIAGNOSIS — R072 Precordial pain: Secondary | ICD-10-CM | POA: Diagnosis not present

## 2017-11-04 DIAGNOSIS — I120 Hypertensive chronic kidney disease with stage 5 chronic kidney disease or end stage renal disease: Secondary | ICD-10-CM | POA: Insufficient documentation

## 2017-11-04 DIAGNOSIS — N185 Chronic kidney disease, stage 5: Secondary | ICD-10-CM | POA: Diagnosis not present

## 2017-11-04 DIAGNOSIS — R079 Chest pain, unspecified: Secondary | ICD-10-CM | POA: Diagnosis not present

## 2017-11-04 LAB — I-STAT CHEM 8, ED
BUN: 122 mg/dL — AB (ref 6–20)
CALCIUM ION: 1.01 mmol/L — AB (ref 1.15–1.40)
CREATININE: 8.9 mg/dL — AB (ref 0.61–1.24)
Chloride: 105 mmol/L (ref 101–111)
GLUCOSE: 107 mg/dL — AB (ref 65–99)
HCT: 32 % — ABNORMAL LOW (ref 39.0–52.0)
HEMOGLOBIN: 10.9 g/dL — AB (ref 13.0–17.0)
Potassium: 5.4 mmol/L — ABNORMAL HIGH (ref 3.5–5.1)
Sodium: 136 mmol/L (ref 135–145)
TCO2: 18 mmol/L — AB (ref 22–32)

## 2017-11-04 LAB — CBC WITH DIFFERENTIAL/PLATELET
Basophils Absolute: 0.1 10*3/uL (ref 0.0–0.1)
Basophils Relative: 1 %
Eosinophils Absolute: 0.4 10*3/uL (ref 0.0–0.7)
Eosinophils Relative: 5 %
HCT: 35.1 % — ABNORMAL LOW (ref 39.0–52.0)
Hemoglobin: 11.3 g/dL — ABNORMAL LOW (ref 13.0–17.0)
Lymphocytes Relative: 13 %
Lymphs Abs: 1.1 10*3/uL (ref 0.7–4.0)
MCH: 29 pg (ref 26.0–34.0)
MCHC: 32.2 g/dL (ref 30.0–36.0)
MCV: 90 fL (ref 78.0–100.0)
Monocytes Absolute: 0.5 10*3/uL (ref 0.1–1.0)
Monocytes Relative: 6 %
Neutro Abs: 6.4 10*3/uL (ref 1.7–7.7)
Neutrophils Relative %: 75 %
Platelets: 151 10*3/uL (ref 150–400)
RBC: 3.9 MIL/uL — ABNORMAL LOW (ref 4.22–5.81)
RDW: 18 % — ABNORMAL HIGH (ref 11.5–15.5)
WBC: 8.5 10*3/uL (ref 4.0–10.5)

## 2017-11-04 LAB — BASIC METABOLIC PANEL
Anion gap: 16 — ABNORMAL HIGH (ref 5–15)
BUN: 130 mg/dL — ABNORMAL HIGH (ref 6–20)
CO2: 19 mmol/L — ABNORMAL LOW (ref 22–32)
Calcium: 8.8 mg/dL — ABNORMAL LOW (ref 8.9–10.3)
Chloride: 100 mmol/L — ABNORMAL LOW (ref 101–111)
Creatinine, Ser: 8.42 mg/dL — ABNORMAL HIGH (ref 0.61–1.24)
GFR calc Af Amer: 6 mL/min — ABNORMAL LOW (ref >60)
GFR calc non Af Amer: 5 mL/min — ABNORMAL LOW (ref >60)
Glucose, Bld: 170 mg/dL — ABNORMAL HIGH (ref 65–99)
Potassium: 4.9 mmol/L (ref 3.5–5.1)
Sodium: 135 mmol/L (ref 135–145)

## 2017-11-04 LAB — I-STAT TROPONIN, ED: Troponin i, poc: 0.02 ng/mL (ref 0.00–0.08)

## 2017-11-04 NOTE — ED Provider Notes (Signed)
[facility] EMERGENCY DEPARTMENT Provider Note   CSN: [id number] Arrival date & time: 11/04/17  1207     History   Chief Complaint Chief Complaint  Patient presents with  . Chest Pain    HPI Matthew Mays is a 77 y.o. male.  HPI  The patient is a 77 year old male, he has been seen by the emergency department multiple times over the last 6 months due to what he describes as a chest pain precordial in location, no radiation to the jaw or the shoulders however his symptoms have been rather persistent when they occur and resolve when he gets a blood transfusion.  He does have a correlating anemia with most of these episodes.  He has reviewed his heart catheterization and has known progressive and advanced renal disease with a baseline creatinine of around 8 and he has refused dialysis as well.  He does have a history of bladder cancer for which she has a diverting urostomy after surgery many years ago and has had no trouble with this nor has he had any fevers chills nausea vomiting or diarrhea.  He walks with a walker, lives by himself and does not drive.  He called the ambulance today because of recurrent chest pain which started this morning, he took aspirin, but it has essentially resolved and he is symptom-free at this time.  No coughing, no fever, no shortness of breath.  According to the medical record the case manager was consulted to help set up ongoing outpatient transfusions about every 2 weeks  Past Medical History:  Diagnosis Date  . Anemia   . Aortic regurgitation 08/13/2017   moderate  . Aortic stenosis 08/13/2017   mild  . Bladder cancer ([place])   . Chronic kidney disease   . H/O sinus tachycardia   . Hypertension   . Mitral regurgitation 08/13/2017   mild  . Non-ST elevation (NSTEMI) myocardial infarction ([place])   . Prediabetes   . Secondary hyperparathyroidism [facility])     Patient Active Problem List   Diagnosis Date Noted  . Chest pain 10/22/2017    . 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 Palliative care by specialist   . Advance care planning   . Non-ST elevation (NSTEMI) myocardial infarction ([place])   . Atypical chest pain 08/12/2017  . Diarrhea 07/22/2017  . Hyponatremia 07/22/2017  . Hypokalemia 07/22/2017  . Elevated troponin 07/22/2017  . Chest discomfort 07/22/2017  . Hyperkalemia 07/22/2017  . Anemia 07/21/2017  . Glaucoma 07/21/2017  . UTI (lower urinary tract infection) 06/19/2016  . Sepsis ([place]) 06/19/2016  . Hyperglycemia 06/19/2016  . Hypertension 06/19/2016  . AKI (acute kidney injury) ([place]) 06/19/2016  . CKD (chronic kidney disease), stage V ([place]) 06/19/2016  . End stage renal disease ([place]) 03/22/2012    Past Surgical History:  Procedure Laterality Date  . ARTERIOVENOUS GRAFT PLACEMENT     left arm AVG  . AV FISTULA PLACEMENT  03/30/2012   Procedure: ARTERIOVENOUS (AV) FISTULA CREATION;  Surgeon: Angelia Mould, MD;  Location: [facility];  Service: Vascular;  Laterality: Left;  . CYSTECTOMY     w/ ileal diversion for bladder cancer   . TONSILLECTOMY         Home Medications    Prior to Admission medications   Medication Sig Start Date End Date Taking? Authorizing Provider  calcium carbonate (TUMS EX) 750 MG chewable tablet Chew 3 tablets by mouth 2 (two) times daily. LUNCH and SUPPER  [provider]  dorzolamide (TRUSOPT) 2 % ophthalmic solution Place 1 drop into the right eye 2 (two) times daily.     [provider]  ferrous sulfate (IRON SUPPLEMENT) 325 (65 FE) MG tablet Take 325 mg by mouth 2 (two) times daily with a meal. LUNCH and SUPPER    [provider]  furosemide (LASIX) 80 MG tablet Take 80 mg by mouth daily.     [provider]  isosorbide mononitrate (IMDUR) 30 MG 24 hr tablet Take 3 tablets (90 mg total) by mouth daily. 09/22/17   Geradine Girt, DO  latanoprost (XALATAN) 0.005 % ophthalmic solution Place 1 drop  into both eyes daily. 5 PM    [provider]  metoprolol tartrate (LOPRESSOR) 100 MG tablet Take 100 mg by mouth 2 (two) times daily. LUNCH and BEDTIME    [provider]  sodium bicarbonate 650 MG tablet Take 1 tablet (650 mg total) by mouth 2 (two) times daily. 10/23/17 11/22/17  Florencia Reasons, MD    Family History Family History  Problem Relation Age of Onset  . Cancer Mother        BRAIN    Social History Social History   Tobacco Use  . Smoking status: Former Smoker    Packs/day: 2.00    Years: 40.00    Pack years: 80.00    Types: Cigarettes, Pipe    Last attempt to quit: 03/22/2006    Years since quitting: 11.6  . Smokeless tobacco: Never Used  . Tobacco comment: Stopped smoking 10 years ago.  Substance Use Topics  . Alcohol use: Yes    Alcohol/week: 19.2 oz    Types: 25 Glasses of wine, 7 Cans of beer per week  . Drug use: No     Allergies   Patient has no known allergies.   Review of Systems Review of Systems  All other systems reviewed and are negative.    Physical Exam Updated Vital Signs BP (!) 150/69   Pulse (!) 104   Resp (!) 25   SpO2 99%   Physical Exam  Constitutional: He appears well-developed and well-nourished. No distress.  HENT:  Head: Normocephalic and atraumatic.  Mouth/Throat: Oropharynx is clear and moist. No oropharyngeal exudate.  Eyes: Conjunctivae and EOM are normal. Pupils are equal, round, and reactive to light. Right eye exhibits no discharge. Left eye exhibits no discharge. No scleral icterus.  Neck: Normal range of motion. Neck supple. No JVD present. No thyromegaly present.  Cardiovascular: Normal rate, regular rhythm and intact distal pulses. Exam reveals no gallop and no friction rub.  Murmur ( Soft systolic murmur) heard. Pulmonary/Chest: Effort normal and breath sounds normal. No respiratory distress. He has no wheezes. He has no rales.  Abdominal: Soft. Bowel sounds are normal. He exhibits no distension and  no mass. There is no tenderness.  Urostomy bag to the right lower quadrant, full of clear yellow urine, soft nontender abdomen  Musculoskeletal: Normal range of motion. He exhibits no edema or tenderness.  Lymphadenopathy:    He has no cervical adenopathy.  Neurological: He is alert. Coordination normal.  Skin: Skin is warm and dry. No rash noted. No erythema.  Psychiatric: He has a normal mood and affect. His behavior is normal.  Nursing note and vitals reviewed.    ED Treatments / Results  Labs (all labs ordered are listed, but only abnormal results are displayed) Labs Reviewed  BASIC METABOLIC PANEL  CBC WITH DIFFERENTIAL/PLATELET  I-STAT TROPONIN, ED  EKG  EKG Interpretation  Date/Time:  Friday November 04 2017 12:26:25 EST Ventricular Rate:  101 PR Interval:    QRS Duration: 146 QT Interval:  391 QTC Calculation: 507 R Axis:   -97 Text Interpretation:  Sinus tachycardia Ventricular premature complex RBBB and LAFB ST depr, consider ischemia, anterolateral lds Since last tracing rate faster otherwise no significant changes Confirmed by Noemi Chapel [phone]) on 11/04/2017 12:44:53 PM       Radiology No results found.  Procedures Procedures (including critical care time)  Medications Ordered in ED Medications - No data to display   Initial Impression / Assessment and Plan / ED Course  I have reviewed the triage vital signs and the nursing notes.  Pertinent labs & imaging results that were available during my care of the patient were reviewed by me and considered in my medical decision making (see chart for details).    The patient has no acute findings on exam, will check hemoglobin, will also investigate further when he is supposed to have an outpatient transfusion  SW asked to assist with investigating his outpatient resources and appointments  Hgb reassuring - pt states that he does not believe the number and wants it rechecked,  Change of shift - care  signed out to Dr. Lita Mains to f/u results and recommendations from SW.  Pt appears stable, has no acute cardiac findings and has refused intervention in the past - his care has been medically maximized per cardiology  Final Clinical Impressions(s) / ED Diagnoses   Final diagnoses:  None    ED Discharge Orders    None       Noemi Chapel, MD 11/05/17 [phone]

## 2017-11-04 NOTE — ED Triage Notes (Signed)
Pt here with chest pain radiating across his chest , pt has history of same , pt given 324asa by ems , refused nitro

## 2017-11-04 NOTE — Discharge Planning (Signed)
EDCM spoke with [name] to find that pt was discharged from [facility]  in December.  [name] states that [facility]  does not provide blood transfusion in the home setting services.  EDCM will search for [facility] with this capability.

## 2017-11-04 NOTE — Discharge Planning (Signed)
EDCM spoke with B[facility]i of [facility] regarding biweekly blood transfusions for pt.  Bambi advised to contact [facility]) for further assistance.

## 2017-11-11 ENCOUNTER — Other Ambulatory Visit: Payer: Self-pay

## 2017-11-11 ENCOUNTER — Encounter (HOSPITAL_COMMUNITY): Payer: Self-pay

## 2017-11-11 ENCOUNTER — Emergency Department (HOSPITAL_COMMUNITY): Payer: Medicare Other

## 2017-11-11 ENCOUNTER — Inpatient Hospital Stay (HOSPITAL_COMMUNITY)
Admission: EM | Admit: 2017-11-11 | Discharge: 2017-11-12 | DRG: 683 | Disposition: A | Discharge status: Home / Self Care | Payer: Medicare Other | Attending: Internal Medicine | Admitting: Internal Medicine

## 2017-11-11 DIAGNOSIS — N390 Urinary tract infection, site not specified: Secondary | ICD-10-CM | POA: Diagnosis present

## 2017-11-11 DIAGNOSIS — E872 Acidosis: Secondary | ICD-10-CM | POA: Diagnosis present

## 2017-11-11 DIAGNOSIS — I700 Atherosclerosis of aorta: Secondary | ICD-10-CM | POA: Diagnosis present

## 2017-11-11 DIAGNOSIS — N185 Chronic kidney disease, stage 5: Secondary | ICD-10-CM | POA: Diagnosis not present

## 2017-11-11 DIAGNOSIS — R0789 Other chest pain: Secondary | ICD-10-CM | POA: Diagnosis present

## 2017-11-11 DIAGNOSIS — I120 Hypertensive chronic kidney disease with stage 5 chronic kidney disease or end stage renal disease: Secondary | ICD-10-CM

## 2017-11-11 DIAGNOSIS — I080 Rheumatic disorders of both mitral and aortic valves: Secondary | ICD-10-CM | POA: Diagnosis present

## 2017-11-11 DIAGNOSIS — Z79899 Other long term (current) drug therapy: Secondary | ICD-10-CM

## 2017-11-11 DIAGNOSIS — E860 Dehydration: Secondary | ICD-10-CM | POA: Diagnosis present

## 2017-11-11 DIAGNOSIS — D631 Anemia in chronic kidney disease: Secondary | ICD-10-CM | POA: Diagnosis present

## 2017-11-11 DIAGNOSIS — Z8551 Personal history of malignant neoplasm of bladder: Secondary | ICD-10-CM

## 2017-11-11 DIAGNOSIS — R072 Precordial pain: Secondary | ICD-10-CM | POA: Diagnosis not present

## 2017-11-11 DIAGNOSIS — I2510 Atherosclerotic heart disease of native coronary artery without angina pectoris: Secondary | ICD-10-CM | POA: Diagnosis present

## 2017-11-11 DIAGNOSIS — E875 Hyperkalemia: Secondary | ICD-10-CM | POA: Diagnosis present

## 2017-11-11 DIAGNOSIS — I252 Old myocardial infarction: Secondary | ICD-10-CM | POA: Diagnosis not present

## 2017-11-11 DIAGNOSIS — E861 Hypovolemia: Secondary | ICD-10-CM | POA: Diagnosis present

## 2017-11-11 DIAGNOSIS — Z936 Other artificial openings of urinary tract status: Secondary | ICD-10-CM | POA: Diagnosis not present

## 2017-11-11 DIAGNOSIS — R7303 Prediabetes: Secondary | ICD-10-CM | POA: Diagnosis present

## 2017-11-11 DIAGNOSIS — N2581 Secondary hyperparathyroidism of renal origin: Secondary | ICD-10-CM | POA: Diagnosis present

## 2017-11-11 DIAGNOSIS — R079 Chest pain, unspecified: Secondary | ICD-10-CM | POA: Diagnosis not present

## 2017-11-11 DIAGNOSIS — Z87891 Personal history of nicotine dependence: Secondary | ICD-10-CM

## 2017-11-11 DIAGNOSIS — N179 Acute kidney failure, unspecified: Principal | ICD-10-CM | POA: Diagnosis present

## 2017-11-11 LAB — BASIC METABOLIC PANEL
ANION GAP: 20 — AB (ref 5–15)
BUN: 139 mg/dL — ABNORMAL HIGH (ref 6–20)
CALCIUM: 8.4 mg/dL — AB (ref 8.9–10.3)
CO2: 14 mmol/L — AB (ref 22–32)
Chloride: 101 mmol/L (ref 101–111)
Creatinine, Ser: 8.4 mg/dL — ABNORMAL HIGH (ref 0.61–1.24)
GFR calc Af Amer: 6 mL/min — ABNORMAL LOW (ref >60)
GFR calc non Af Amer: 5 mL/min — ABNORMAL LOW (ref >60)
Glucose, Bld: 113 mg/dL — ABNORMAL HIGH (ref 65–99)
POTASSIUM: 4.9 mmol/L (ref 3.5–5.1)
SODIUM: 135 mmol/L (ref 135–145)

## 2017-11-11 LAB — URINALYSIS, ROUTINE W REFLEX MICROSCOPIC
Bilirubin Urine: NEGATIVE
Glucose, UA: NEGATIVE mg/dL
Ketones, ur: NEGATIVE mg/dL
NITRITE: NEGATIVE
Protein, ur: 100 mg/dL — AB
SPECIFIC GRAVITY, URINE: 1.009 (ref 1.005–1.030)
SQUAMOUS EPITHELIAL / LPF: NONE SEEN
pH: 7 (ref 5.0–8.0)

## 2017-11-11 LAB — CBC
HCT: 31.1 % — ABNORMAL LOW (ref 39.0–52.0)
HEMATOCRIT: 30.4 % — AB (ref 39.0–52.0)
HEMOGLOBIN: 10.2 g/dL — AB (ref 13.0–17.0)
Hemoglobin: 10.4 g/dL — ABNORMAL LOW (ref 13.0–17.0)
MCH: 30 pg (ref 26.0–34.0)
MCH: 30.5 pg (ref 26.0–34.0)
MCHC: 33.4 g/dL (ref 30.0–36.0)
MCHC: 33.6 g/dL (ref 30.0–36.0)
MCV: 89.6 fL (ref 78.0–100.0)
MCV: 91 fL (ref 78.0–100.0)
PLATELETS: 136 10*3/uL — AB (ref 150–400)
Platelets: 136 10*3/uL — ABNORMAL LOW (ref 150–400)
RBC: 3.47 MIL/uL — ABNORMAL LOW (ref 4.22–5.81)
RBC: 3.34 MIL/uL — AB (ref 4.22–5.81)
RDW: 17.8 % — ABNORMAL HIGH (ref 11.5–15.5)
RDW: 18.1 % — AB (ref 11.5–15.5)
WBC: 8.8 10*3/uL (ref 4.0–10.5)
WBC: 8.1 10*3/uL (ref 4.0–10.5)

## 2017-11-11 LAB — I-STAT TROPONIN, ED
TROPONIN I, POC: 0.08 ng/mL (ref 0.00–0.08)
Troponin i, poc: 0 ng/mL (ref 0.00–0.08)

## 2017-11-11 LAB — TROPONIN I: TROPONIN I: 0.08 ng/mL — AB (ref <0.03)

## 2017-11-11 LAB — CREATININE, SERUM
Creatinine, Ser: 8.61 mg/dL — ABNORMAL HIGH (ref 0.61–1.24)
GFR calc non Af Amer: 5 mL/min — ABNORMAL LOW (ref >60)
GFR, EST AFRICAN AMERICAN: 6 mL/min — AB (ref >60)

## 2017-11-11 MED ORDER — SODIUM BICARBONATE 650 MG PO TABS
650.0000 mg | ORAL_TABLET | Freq: Two times a day (BID) | ORAL | Status: DC
  Administered 2017-11-11 – 2017-11-12 (×2): 650 mg via ORAL
  Filled 2017-11-11 (×2): qty 1

## 2017-11-11 MED ORDER — CALCIUM CARBONATE ANTACID 500 MG PO CHEW
3.0000 | CHEWABLE_TABLET | Freq: Two times a day (BID) | ORAL | Status: DC
  Filled 2017-11-11 (×3): qty 3

## 2017-11-11 MED ORDER — PIPERACILLIN-TAZOBACTAM 3.375 G IVPB 30 MIN
3.3750 g | Freq: Once | INTRAVENOUS | Status: AC
  Administered 2017-11-11: 3.375 g via INTRAVENOUS
  Filled 2017-11-11: qty 50

## 2017-11-11 MED ORDER — ONDANSETRON HCL 4 MG/2ML IJ SOLN: 4.0000 mg | Freq: Four times a day (QID) | INTRAMUSCULAR | Status: DC | PRN

## 2017-11-11 MED ORDER — ACETAMINOPHEN 325 MG PO TABS: 650.0000 mg | ORAL_TABLET | Freq: Four times a day (QID) | ORAL | Status: DC | PRN

## 2017-11-11 MED ORDER — HEPARIN SODIUM (PORCINE) 5000 UNIT/ML IJ SOLN
5000.0000 [IU] | Freq: Three times a day (TID) | INTRAMUSCULAR | Status: DC
  Administered 2017-11-11 – 2017-11-12 (×2): 5000 [IU] via SUBCUTANEOUS
  Filled 2017-11-11 (×2): qty 1

## 2017-11-11 MED ORDER — FUROSEMIDE 80 MG PO TABS
80.0000 mg | ORAL_TABLET | Freq: Every day | ORAL | Status: DC
  Administered 2017-11-11 – 2017-11-12 (×2): 80 mg via ORAL
  Filled 2017-11-11 (×2): qty 1

## 2017-11-11 MED ORDER — ISOSORBIDE MONONITRATE ER 60 MG PO TB24
90.0000 mg | ORAL_TABLET | Freq: Every day | ORAL | Status: DC
  Administered 2017-11-11 – 2017-11-12 (×2): 90 mg via ORAL
  Filled 2017-11-11 (×2): qty 1

## 2017-11-11 MED ORDER — FERROUS SULFATE 325 (65 FE) MG PO TABS
325.0000 mg | ORAL_TABLET | Freq: Two times a day (BID) | ORAL | Status: DC
  Administered 2017-11-12: 325 mg via ORAL
  Filled 2017-11-11: qty 1

## 2017-11-11 MED ORDER — LATANOPROST 0.005 % OP SOLN
1.0000 [drp] | Freq: Every day | OPHTHALMIC | Status: DC
  Filled 2017-11-11: qty 2.5

## 2017-11-11 MED ORDER — ACETAMINOPHEN 650 MG RE SUPP: 650.0000 mg | Freq: Four times a day (QID) | RECTAL | Status: DC | PRN

## 2017-11-11 MED ORDER — DEXTROSE 5 % IV SOLN
1.0000 g | INTRAVENOUS | Status: DC
  Administered 2017-11-11: 1 g via INTRAVENOUS
  Filled 2017-11-11: qty 10

## 2017-11-11 MED ORDER — ONDANSETRON HCL 4 MG PO TABS: 4.0000 mg | ORAL_TABLET | Freq: Four times a day (QID) | ORAL | Status: DC | PRN

## 2017-11-11 MED ORDER — DORZOLAMIDE HCL 2 % OP SOLN
1.0000 [drp] | Freq: Two times a day (BID) | OPHTHALMIC | Status: DC
  Administered 2017-11-12: 1 [drp] via OPHTHALMIC
  Filled 2017-11-11 (×2): qty 10

## 2017-11-11 MED ORDER — METOPROLOL TARTRATE 100 MG PO TABS
100.0000 mg | ORAL_TABLET | Freq: Two times a day (BID) | ORAL | Status: DC
  Administered 2017-11-11 – 2017-11-12 (×2): 100 mg via ORAL
  Filled 2017-11-11 (×2): qty 1

## 2017-11-11 NOTE — H&P (Signed)
History and Physical    Matthew Mays RWE:[id number] DOB: [birth year] DOA: 11/11/2017  PCP: Clovia Cuff, MD   Patient coming from: Home  Chief Complaint:  Chest pain  HPI: Matthew Mays is a 77 y.o. male with medical history significant of chronic kidney disease stage V, COPD with chronic hypoxic respiratory failure, anemia of chronic kidney disease, hypertension, hyperparathyroidism, bladder cancer status post urostomy.   Patient presents with chest pain, the pain occurred while he was at rest, it lasted for about 30 minutes, self limited, but refractory to aspirin. No improving or worsening factors, no other associated symptoms, no radiation. Due to persistent pain he was brought to the hospital for further evaluation. Denies any change in the color or consistency of the urine output, he does have a urostomy bag.   ED Course: Patient was found hypovolemic, dehydrated, worsening kidney function. He was referred for admission for evaluation  Review of Systems:  1. General: No fevers, no chills, no weight gain or weight loss 2. ENT: No runny nose or sore throat, no hearing disturbances 3. Pulmonary: No dyspnea, cough, wheezing, or hemoptysis 4. Cardiovascular: No angina, claudication, lower extremity edema, pnd or orthopnea 5. Gastrointestinal: No nausea or vomiting, no diarrhea or constipation 6. Hematology: No easy bruisability or frequent infections 7. Urology: No dysuria, hematuria or increased urinary frequency 8. Dermatology: No rashes. 9. Neurology: No seizures or paresthesias 10. Musculoskeletal: No joint pain or deformities  Past Medical History:  Diagnosis Date  . Anemia   . Aortic regurgitation 08/13/2017   moderate  . Aortic stenosis 08/13/2017   mild  . Bladder cancer ([place])   . Chronic kidney disease   . H/O sinus tachycardia   . Hypertension   . Mitral regurgitation 08/13/2017   mild  . Non-ST elevation (NSTEMI) myocardial infarction ([place])   . Prediabetes   .  Secondary hyperparathyroidism [facility])     Past Surgical History:  Procedure Laterality Date  . ARTERIOVENOUS GRAFT PLACEMENT     left arm AVG  . AV FISTULA PLACEMENT  03/30/2012   Procedure: ARTERIOVENOUS (AV) FISTULA CREATION;  Surgeon: Angelia Mould, MD;  Location: [facility];  Service: Vascular;  Laterality: Left;  . CYSTECTOMY     w/ ileal diversion for bladder cancer   . TONSILLECTOMY       reports that he quit smoking about 11 years ago. His smoking use included cigarettes and pipe. He has a 80.00 pack-year smoking history. he has never used smokeless tobacco. He reports that he drinks about 19.2 oz of alcohol per week. He reports that he does not use drugs.  No Known Allergies  Family History  Problem Relation Age of Onset  . Cancer Mother        BRAIN     Prior to Admission medications   Medication Sig Start Date End Date Taking? Authorizing Provider  calcium carbonate (TUMS EX) 750 MG chewable tablet Chew 3 tablets by mouth 2 (two) times daily. LUNCH and SUPPER   Yes [provider]  dorzolamide (TRUSOPT) 2 % ophthalmic solution Place 1 drop into the right eye 2 (two) times daily.    Yes [provider]  ferrous sulfate (IRON SUPPLEMENT) 325 (65 FE) MG tablet Take 325 mg by mouth 2 (two) times daily with a meal. LUNCH and SUPPER   Yes [provider]  furosemide (LASIX) 80 MG tablet Take 80 mg by mouth daily.    Yes [provider]  isosorbide mononitrate (IMDUR) 30  MG 24 hr tablet Take 3 tablets (90 mg total) by mouth daily. 09/22/17  Yes Vann, Jessica U, DO  latanoprost (XALATAN) 0.005 % ophthalmic solution Place 1 drop into both eyes daily. 5 PM   Yes [provider]  metoprolol tartrate (LOPRESSOR) 100 MG tablet Take 100 mg by mouth 2 (two) times daily. LUNCH and BEDTIME   Yes [provider]  sodium bicarbonate 650 MG tablet Take 1 tablet (650 mg total) by mouth 2 (two) times daily. 10/23/17 11/22/17 Yes Florencia Reasons, MD      Physical Exam: Vitals:   11/11/17 1500 11/11/17 1530 11/11/17 1600 11/11/17 1630  BP: (!) 128/51 (!) 141/53 (!) 146/84 (!) 128/45  Pulse: 87 88  78  Resp: 19  (!) 24 20  Temp:      TempSrc:      SpO2: 100% 99% 100% 99%    Constitutional: NAD, calm, deconditioned Vitals:   11/11/17 1500 11/11/17 1530 11/11/17 1600 11/11/17 1630  BP: (!) 128/51 (!) 141/53 (!) 146/84 (!) 128/45  Pulse: 87 88  78  Resp: 19  (!) 24 20  Temp:      TempSrc:      SpO2: 100% 99% 100% 99%   Eyes: PERRL, lids and conjunctivae normal Head normocephalic, earsnodeformities ENMT: Mucous membranes are dry. Posterior pharynx clear of any exudate or lesions.Normal dentition.  Neck: normal, supple, no masses, no thyromegaly Respiratory: Decreased to auscultation bilaterally at bases, no wheezing, no crackles. Normal respiratory effort. No accessory muscle use.  Cardiovascular: Regular rate and rhythm, no murmurs / rubs / gallops. No extremity edema. 2+ pedal pulses. No carotid bruits.  Abdomen: no tenderness, no masses palpated. No hepatosplenomegaly. Bowel sounds positive. Urostomy bag in place, clear urine Musculoskeletal: no clubbing / cyanosis. No joint deformity upper and lower extremities. Good ROM, no contractures. Normal muscle tone.  Skin: no rashes, lesions, ulcers. No induration Neurologic: CN 2-12 grossly intact. Sensation intact, DTR normal. Strength 5/5 in all 4.     Labs on Admission: I have personally reviewed following labs and imaging studies  CBC: Recent Labs  Lab 11/11/17 1109  WBC 8.8  HGB 10.4*  HCT 31.1*  MCV 89.6  PLT 841*   Basic Metabolic Panel: Recent Labs  Lab 11/11/17 1109  NA 135  K 4.9  CL 101  CO2 14*  GLUCOSE 113*  BUN 139*  CREATININE 8.40*  CALCIUM 8.4*   GFR: CrCl cannot be calculated (Unknown ideal weight.). Liver Function Tests: No results for input(s): AST, ALT, ALKPHOS, BILITOT, PROT, ALBUMIN in the last 168 hours. No results for input(s):  LIPASE, AMYLASE in the last 168 hours. No results for input(s): AMMONIA in the last 168 hours. Coagulation Profile: No results for input(s): INR, PROTIME in the last 168 hours. Cardiac Enzymes: No results for input(s): CKTOTAL, CKMB, CKMBINDEX, TROPONINI in the last 168 hours. BNP (last 3 results) No results for input(s): PROBNP in the last 8760 hours. HbA1C: No results for input(s): HGBA1C in the last 72 hours. CBG: No results for input(s): GLUCAP in the last 168 hours. Lipid Profile: No results for input(s): CHOL, HDL, LDLCALC, TRIG, CHOLHDL, LDLDIRECT in the last 72 hours. Thyroid Function Tests: No results for input(s): TSH, T4TOTAL, FREET4, T3FREE, THYROIDAB in the last 72 hours. Anemia Panel: No results for input(s): VITAMINB12, FOLATE, FERRITIN, TIBC, IRON, RETICCTPCT in the last 72 hours. Urine analysis:    Component Value Date/Time   COLORURINE YELLOW 11/11/2017 1413   APPEARANCEUR CLOUDY (A) 11/11/2017 1413  LABSPEC 1.009 11/11/2017 1413   PHURINE 7.0 11/11/2017 1413   GLUCOSEU NEGATIVE 11/11/2017 1413   HGBUR SMALL (A) 11/11/2017 [place] 11/11/2017 1413   KETONESUR NEGATIVE 11/11/2017 1413   PROTEINUR 100 (A) 11/11/2017 1413   NITRITE NEGATIVE 11/11/2017 1413   LEUKOCYTESUR LARGE (A) 11/11/2017 1413    Radiological Exams on Admission: Dg Chest 2 View  Result Date: 11/11/2017 CLINICAL DATA:  Chest pain EXAM: CHEST  2 VIEW COMPARISON:  November 04, 2017 FINDINGS: There is slight scarring in the lung bases. There is no edema or consolidation. The heart size and pulmonary vascularity are normal. No adenopathy. There is aortic atherosclerosis. There is extensive brachial and axillary artery calcification bilaterally. There is degenerative change in the thoracic spine. IMPRESSION: Extensive aortic atherosclerosis. Peripheral arterial vascular calcification bilaterally also noted. No edema or consolidation.  Mild bibasilar scarring. Aortic Atherosclerosis  (ICD10-I70.0). Electronically Signed   By: Lowella Grip III M.D.   On: 11/11/2017 11:32    EKG: Independently reviewed. Sinus rhythm, 99 bpm, left axis deviation with right bundle branch block. No significant ST elevations or T-wave changes.   Assessment/Plan Active Problems:   AKI (acute kidney injury) [facility])  This is a 77 year old male who presents with recurrent chest pain, recent hospitalization December 28, rule out for acute coronary syndrome. Apparently he has been deemed not candidate for cardiac catheterization due to poor renal function. His chest pain is atypical, likely noncardiac in origin. Physical examination blood pressure 139/50, heart rate 80, respiratory rate 19. Dry mucous membranes, lungs clear to auscultation, heart S1-S2 present rhythmic, the abdomen soft, urostomy bag in place with clear urine, no lower extremity edema. Sodium 135, potassium 4.9, chloride 11, bicarbonate 40, glucose 113, BNP 39, creatinine 8.40, troponin 0.08, white count 8.8, hemoglobin 10.4, hematocrit 31.1, platelets 136, urine analysis is too numerous to count white cells. Chest x-ray negative for infiltrates.  Patient will be admitted with working diagnosis of atypical chest pain, in the setting of possible urinary tract infection.  1. Atypical chest pain in the setting of coronary artery disease. Will do serial cardiac enzymes, EKG in the morning. Continue metoprolol, not on aspirin due to recurrent anemia.  2. Chronic kidney disease stage 5. Will continue close monitoring of kidney function and electrolytes, for now continue furosemide. Potassium is 4.9, serum bicarbonate is 14. Continue by mouth bicarbonate per his home regimen. He does have an anion gap metabolic acidosis, likely related to his worsening kidney function.  3. Suspected urinary tract infection. Positive pyuria in the setting of urostomy, will continue antibiotic therapy with ceftriaxone, follow-up on urine culture. It is possible  that pyuria is related to urostomy not true infection.  4. Hypertension. Continue isosorbide and metoprolol.  DVT prophylaxis: Heparin Code Status: Full code  Family Communication: No family at bedside  Disposition Plan: Home  Consults called: none  Admission status: Observation    Earnie Rockhold Gerome Apley MD [facility] Pager [phone]  If 7PM-7AM, please contact night-coverage [email or web address] Password [facility]  11/11/2017, 5:03 PM

## 2017-11-11 NOTE — ED Notes (Signed)
Dinner tray provided

## 2017-11-11 NOTE — ED Notes (Signed)
Pt reports episode of 2/10 CP while eating; now resolved. Pt NSR at this time.

## 2017-11-11 NOTE — ED Provider Notes (Signed)
[facility] EMERGENCY DEPARTMENT Provider Note   CSN: [id number] Arrival date & time: 11/11/17  1044     History   Chief Complaint Chief Complaint  Patient presents with  . Chest Pain    HPI Matthew Mays is a 77 y.o. male.  Matthew Mays is a 77 y.o. Male chronic kidney disease, stage V with associated anemia, aortic stenosis, hypertension, previous and STEMI, bladder cancer with urostomy presents to the ED via EMS for evaluation patient reports dull precordial chest pain that started this morning, he took 324 of aspirin, chest pain was not relieved after about 20 minutes so he called EMS.  He denies any radiation of the pain to the jaw, neck or arm.  No associated shortness of breath no nausea, vomiting or diaphoresis.  Pain improved while patient was in transit with EMS, and he is now chest pain-free.  Patient did not receive any nitro while in transit.  Patient mildly tachycardic and hypertensive but vitals otherwise stable. Last echo 07/2017 showed EF of 55-60%, with moderate aortic stenosis.  Patient reports he has had very similar symptoms in the past when he has had low hemoglobin and required blood transfusions.      Past Medical History:  Diagnosis Date  . Anemia   . Aortic regurgitation 08/13/2017   moderate  . Aortic stenosis 08/13/2017   mild  . Bladder cancer ([place])   . Chronic kidney disease   . H/O sinus tachycardia   . Hypertension   . Mitral regurgitation 08/13/2017   mild  . Non-ST elevation (NSTEMI) myocardial infarction ([place])   . Prediabetes   . Secondary hyperparathyroidism [facility])     Patient Active Problem List   Diagnosis Date Noted  . Chest pain 10/22/2017  . 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 Palliative care by specialist   . Advance care planning   . Non-ST elevation (NSTEMI) myocardial infarction ([place])   . Atypical chest pain 08/12/2017  . Diarrhea 07/22/2017  . Hyponatremia 07/22/2017    . Hypokalemia 07/22/2017  . Elevated troponin 07/22/2017  . Chest discomfort 07/22/2017  . Hyperkalemia 07/22/2017  . Anemia 07/21/2017  . Glaucoma 07/21/2017  . UTI (lower urinary tract infection) 06/19/2016  . Sepsis ([place]) 06/19/2016  . Hyperglycemia 06/19/2016  . Hypertension 06/19/2016  . AKI (acute kidney injury) ([place]) 06/19/2016  . CKD (chronic kidney disease), stage V ([place]) 06/19/2016  . End stage renal disease ([place]) 03/22/2012    Past Surgical History:  Procedure Laterality Date  . ARTERIOVENOUS GRAFT PLACEMENT     left arm AVG  . AV FISTULA PLACEMENT  03/30/2012   Procedure: ARTERIOVENOUS (AV) FISTULA CREATION;  Surgeon: Angelia Mould, MD;  Location: [facility];  Service: Vascular;  Laterality: Left;  . CYSTECTOMY     w/ ileal diversion for bladder cancer   . TONSILLECTOMY         Home Medications    Prior to Admission medications   Medication Sig Start Date End Date Taking? Authorizing Provider  calcium carbonate (TUMS EX) 750 MG chewable tablet Chew 3 tablets by mouth 2 (two) times daily. LUNCH and SUPPER   Yes [provider]  dorzolamide (TRUSOPT) 2 % ophthalmic solution Place 1 drop into the right eye 2 (two) times daily.    Yes [provider]  ferrous sulfate (IRON SUPPLEMENT) 325 (65 FE) MG tablet Take 325 mg by mouth 2 (two) times daily with a meal. LUNCH and  SUPPER   Yes [provider]  furosemide (LASIX) 80 MG tablet Take 80 mg by mouth daily.    Yes [provider]  isosorbide mononitrate (IMDUR) 30 MG 24 hr tablet Take 3 tablets (90 mg total) by mouth daily. 09/22/17  Yes Vann, Jessica U, DO  latanoprost (XALATAN) 0.005 % ophthalmic solution Place 1 drop into both eyes daily. 5 PM   Yes [provider]  metoprolol tartrate (LOPRESSOR) 100 MG tablet Take 100 mg by mouth 2 (two) times daily. LUNCH and BEDTIME   Yes [provider]  sodium bicarbonate 650 MG tablet Take 1 tablet (650 mg total) by  mouth 2 (two) times daily. 10/23/17 11/22/17 Yes Florencia Reasons, MD    Family History Family History  Problem Relation Age of Onset  . Cancer Mother        BRAIN    Social History Social History   Tobacco Use  . Smoking status: Former Smoker    Packs/day: 2.00    Years: 40.00    Pack years: 80.00    Types: Cigarettes, Pipe    Last attempt to quit: 03/22/2006    Years since quitting: 11.6  . Smokeless tobacco: Never Used  . Tobacco comment: Stopped smoking 10 years ago.  Substance Use Topics  . Alcohol use: Yes    Alcohol/week: 19.2 oz    Types: 25 Glasses of wine, 7 Cans of beer per week  . Drug use: No     Allergies   Patient has no known allergies.   Review of Systems Review of Systems  Constitutional: Negative for chills and fever.  HENT: Negative for congestion, rhinorrhea and sore throat.   Eyes: Negative.   Respiratory: Positive for chest tightness. Negative for cough.   Cardiovascular: Positive for chest pain. Negative for palpitations and leg swelling.  Gastrointestinal: Positive for diarrhea (Chronic). Negative for abdominal pain, nausea and vomiting.  Genitourinary: Negative for dysuria and flank pain.  Skin: Negative for color change and pallor.  Neurological: Negative for dizziness, weakness, light-headedness, numbness and headaches.     Physical Exam Updated Vital Signs BP (!) 141/53   Pulse 88   Temp 97.8 F (36.6 C) (Oral)   Resp 19   SpO2 99%   Physical Exam  Constitutional: He appears well-developed and well-nourished.  Non-toxic appearance. He does not appear ill. No distress.  HENT:  Head: Normocephalic and atraumatic.  Eyes: Right eye exhibits no discharge. Left eye exhibits no discharge.  Neck: Neck supple.  Cardiovascular: Regular rhythm and normal heart sounds.  Mild Tachycardia  Pulmonary/Chest: Effort normal and breath sounds normal. No stridor. No respiratory distress. He has no wheezes. He has no rales.  Abdominal: Soft. Bowel  sounds are normal. He exhibits no distension and no mass. There is no tenderness. There is no guarding.  Musculoskeletal: He exhibits no edema or deformity.  Neurological: He is alert. Coordination normal.  Skin: Skin is warm and dry. He is not diaphoretic.  Psychiatric: He has a normal mood and affect. His behavior is normal.  Nursing note and vitals reviewed.    ED Treatments / Results  Labs (all labs ordered are listed, but only abnormal results are displayed) Labs Reviewed  CBC - Abnormal; Notable for the following components:      Result Value   RBC 3.47 (*)    Hemoglobin 10.4 (*)    HCT 31.1 (*)    RDW 17.8 (*)    Platelets 136 (*)    All  other components within normal limits  BASIC METABOLIC PANEL - Abnormal; Notable for the following components:   CO2 14 (*)    Glucose, Bld 113 (*)    BUN 139 (*)    Creatinine, Ser 8.40 (*)    Calcium 8.4 (*)    GFR calc non Af Amer 5 (*)    GFR calc Af Amer 6 (*)    Anion gap 20 (*)    All other components within normal limits  URINALYSIS, ROUTINE W REFLEX MICROSCOPIC - Abnormal; Notable for the following components:   APPearance CLOUDY (*)    Hgb urine dipstick SMALL (*)    Protein, ur 100 (*)    Leukocytes, UA LARGE (*)    Bacteria, UA MANY (*)    All other components within normal limits  URINE CULTURE  I-STAT TROPONIN, ED  I-STAT TROPONIN, ED    EKG  EKG Interpretation  Date/Time:  Friday November 11 2017 10:49:46 EST Ventricular Rate:  99 PR Interval:    QRS Duration: 139 QT Interval:  405 QTC Calculation: 520 R Axis:   -80 Text Interpretation:  Sinus rhythm RBBB and LAFB Similar ST depressions in inferior and lateral leads in comparison to prior No significant change since last tracing Confirmed by Gareth Morgan [phone]) on 11/11/2017 10:52:54 AM       Radiology Dg Chest 2 View  Result Date: 11/11/2017 CLINICAL DATA:  Chest pain EXAM: CHEST  2 VIEW COMPARISON:  November 04, 2017 FINDINGS: There is slight  scarring in the lung bases. There is no edema or consolidation. The heart size and pulmonary vascularity are normal. No adenopathy. There is aortic atherosclerosis. There is extensive brachial and axillary artery calcification bilaterally. There is degenerative change in the thoracic spine. IMPRESSION: Extensive aortic atherosclerosis. Peripheral arterial vascular calcification bilaterally also noted. No edema or consolidation.  Mild bibasilar scarring. Aortic Atherosclerosis (ICD10-I70.0). Electronically Signed   By: Lowella Grip III M.D.   On: 11/11/2017 11:32    Procedures Procedures (including critical care time)  Medications Ordered in ED Medications  piperacillin-tazobactam (ZOSYN) IVPB 3.375 g (3.375 g Intravenous New Bag/Given 11/11/17 1554)     Initial Impression / Assessment and Plan / ED Course  I have reviewed the triage vital signs and the nursing notes.  Pertinent labs & imaging results that were available during my care of the patient were reviewed by me and considered in my medical decision making (see chart for details).  Patient presents to the ED via EMS for evaluation of dull precordial chest pain.  Upon arrival patient is chest pain-free.  No associated shortness of breath, nausea, vomiting or diaphoresis no radiation of the pain.  Patient has had very similar chest pain episodes when he required blood transfusions.  No other associated symptoms.  On arrival patient is mildly tachycardic but vitals otherwise normal.  No focal findings on exam.  Patient does have dirty appearing urine in the urostomy bag we will send for UA and culture.  EKG without significant changes when compared to previous.  Troponin is negative.  Any function is at baseline, no hyperkalemia.  Hemoglobin is 10.4, was 11.3 weeks ago, no transfusions required at this time.  No leukocytosis.  Chest x-ray without evidence of active cardiopulmonary disease.  Discussed these results with patient, given that  he does not have anemia requiring transfusion will get delta troponin to rule out other acute ACS because of chest pain.  Patient continues to be chest pain-free.  Troponin increased from  0-0.08.  UA is worse than usual, concerning for infection and patient has had a history of many Klebsiella UTIs, urine culture pending.  Discussed with Dr. Billy Fischer will treat with Zosyn.  Will consult hospitalist team for admission, for chest pain rule out and continue treatment of urostomy associated UTI.  Patient is agreeable to this plan.  Spoke with Dr. Cathlean Sauer with [facility] who will see and admit the patient.  Final Clinical Impressions(s) / ED Diagnoses   Final diagnoses:  Chest pain, unspecified type  Urinary tract infection without hematuria, site unspecified    ED Discharge Orders    None       Jacqlyn Larsen, [place] 11/11/17 1722    Gareth Morgan, MD 11/13/17 [phone]

## 2017-11-11 NOTE — ED Notes (Signed)
ED tech shooting portable EKG at this time d/t pt reporting CP at 2000.

## 2017-11-11 NOTE — ED Triage Notes (Signed)
Pt arrived via [facility] c/o chest pain radiating across upper chest that began this morning. Pt denies SOB, diaphoresis, and n/v. Pt took 324 ASA 20 mins prior to calling 911. Pt has hx of low Hgb and subsequent  transfusions. Pt is alert and oriented x4. Pt denies having pain at this time.

## 2017-11-11 NOTE — ED Notes (Signed)
Dinner tray ordered through service response.  

## 2017-11-12 DIAGNOSIS — E875 Hyperkalemia: Secondary | ICD-10-CM | POA: Diagnosis not present

## 2017-11-12 DIAGNOSIS — R079 Chest pain, unspecified: Secondary | ICD-10-CM | POA: Diagnosis not present

## 2017-11-12 DIAGNOSIS — I080 Rheumatic disorders of both mitral and aortic valves: Secondary | ICD-10-CM | POA: Diagnosis not present

## 2017-11-12 DIAGNOSIS — N179 Acute kidney failure, unspecified: Secondary | ICD-10-CM | POA: Diagnosis not present

## 2017-11-12 DIAGNOSIS — D631 Anemia in chronic kidney disease: Secondary | ICD-10-CM | POA: Diagnosis not present

## 2017-11-12 DIAGNOSIS — N390 Urinary tract infection, site not specified: Secondary | ICD-10-CM | POA: Diagnosis not present

## 2017-11-12 DIAGNOSIS — R7303 Prediabetes: Secondary | ICD-10-CM | POA: Diagnosis not present

## 2017-11-12 DIAGNOSIS — E872 Acidosis: Secondary | ICD-10-CM | POA: Diagnosis not present

## 2017-11-12 DIAGNOSIS — N185 Chronic kidney disease, stage 5: Secondary | ICD-10-CM | POA: Diagnosis not present

## 2017-11-12 DIAGNOSIS — I2510 Atherosclerotic heart disease of native coronary artery without angina pectoris: Secondary | ICD-10-CM | POA: Diagnosis not present

## 2017-11-12 DIAGNOSIS — I252 Old myocardial infarction: Secondary | ICD-10-CM | POA: Diagnosis not present

## 2017-11-12 DIAGNOSIS — N2581 Secondary hyperparathyroidism of renal origin: Secondary | ICD-10-CM | POA: Diagnosis not present

## 2017-11-12 DIAGNOSIS — I120 Hypertensive chronic kidney disease with stage 5 chronic kidney disease or end stage renal disease: Secondary | ICD-10-CM | POA: Diagnosis not present

## 2017-11-12 LAB — URINE CULTURE

## 2017-11-12 LAB — CBC
HCT: 27.9 % — ABNORMAL LOW (ref 39.0–52.0)
HEMOGLOBIN: 9.3 g/dL — AB (ref 13.0–17.0)
MCH: 29.9 pg (ref 26.0–34.0)
MCHC: 33.3 g/dL (ref 30.0–36.0)
MCV: 89.7 fL (ref 78.0–100.0)
Platelets: 137 10*3/uL — ABNORMAL LOW (ref 150–400)
RBC: 3.11 MIL/uL — AB (ref 4.22–5.81)
RDW: 17.9 % — ABNORMAL HIGH (ref 11.5–15.5)
WBC: 7.6 10*3/uL (ref 4.0–10.5)

## 2017-11-12 LAB — TROPONIN I
TROPONIN I: 0.07 ng/mL — AB (ref <0.03)
Troponin I: 0.06 ng/mL (ref <0.03)

## 2017-11-12 LAB — BASIC METABOLIC PANEL
ANION GAP: 18 — AB (ref 5–15)
BUN: 147 mg/dL — ABNORMAL HIGH (ref 6–20)
CALCIUM: 7.6 mg/dL — AB (ref 8.9–10.3)
CO2: 14 mmol/L — ABNORMAL LOW (ref 22–32)
Chloride: 103 mmol/L (ref 101–111)
Creatinine, Ser: 8.6 mg/dL — ABNORMAL HIGH (ref 0.61–1.24)
GFR, EST AFRICAN AMERICAN: 6 mL/min — AB (ref >60)
GFR, EST NON AFRICAN AMERICAN: 5 mL/min — AB (ref >60)
Glucose, Bld: 117 mg/dL — ABNORMAL HIGH (ref 65–99)
POTASSIUM: 4.7 mmol/L (ref 3.5–5.1)
Sodium: 135 mmol/L (ref 135–145)

## 2017-11-12 MED ORDER — CEPHALEXIN 500 MG PO CAPS: 500.0000 mg | ORAL_CAPSULE | Freq: Three times a day (TID) | ORAL | 0 refills | Status: AC

## 2017-11-12 MED ORDER — CEPHALEXIN 500 MG PO CAPS: 500.0000 mg | ORAL_CAPSULE | Freq: Three times a day (TID) | ORAL | 0 refills | Status: DC

## 2017-11-12 NOTE — Discharge Summary (Signed)
Physician Discharge Summary  Matthew Mays WLN:[id number] DOB: 1941-03-06 DOA: 11/11/2017  PCP: Clovia Cuff, MD  Admit date: 11/11/2017 Discharge date: 11/12/2017  Admitted From: Home Disposition:  Home  Recommendations for Outpatient Follow-up:  1. Follow up with PCP in 1-week 2. Patient ruled out for acute coronary syndrome 3. Cephalexin prescribed for 3 days.   Home Health: no  Equipment/Devices: no   Discharge Condition: Stable CODE STATUS: Full  Diet recommendation: Renal prudent  Brief/Interim Summary: This is a 77 year old male who presented with recurrent chest pain, recent hospitalization December 28, he ruled out for acute coronary syndrome. Apparently he has been deemed not candidate for cardiac catheterization due to poor renal function. His chest pain is atypical, likely noncardiac in origin. Physical examination blood pressure 139/50, heart rate 80, respiratory rate 19. Dry mucous membranes, lungs clear to auscultation, heart S1-S2 present rhythmic, the abdomen soft, urostomy bag in place with clear urine, no lower extremity edema. Sodium 135, potassium 4.9, chloride 110, bicarbonate 40, glucose 113, BNP 39, creatinine 8.40, troponin 0.08, white count 8.8, hemoglobin 10.4, hematocrit 31.1, platelets 136, urine analysis is too numerous to count white cells. Chest x-ray negative for infiltrates.  Patient will be admitted with working diagnosis of atypical chest pain, in the setting of possible urinary tract infection.  1. Atypical chest pain, in the setting of coronary artery disease. Rule out for acute coronary syndrome. Patient was admitted to medical floor, placed on telemetry monitor, he remained chest pain-free, his cardiac enzymes remained stable 0.08, 0.07, 0.06. Patient does have stage V chronic kidney disease, condition that can affect troponin level. Patient will follow-up as an outpatient. Patient not on aspirin due to recent anemia, suspected blood loss. Continue  isosorbide and metoprolol.   2. Chronic kidney disease stage V. Patient remained euvolemic, no signs of  uremia, discharge potassium 4.7, serum bicarbonate of 14. Continue furosemide and sodium bicarbonate. Patient has declined renal replacement therapy in the past, will recommend outpatient follow-up. Continue calcium carbonate  3. Urinary tract infection. Urinalysis positive for pyuria, culture with multiple bacteria, prior cultures personally reviewed august 2017, positive for Klebsiella resistant only to ampicillin. Patient received ceftriaxone in the hospital and discharged on a short course of cephalexin.   4. Hypertension. Continue metoprolol and isosorbide.   Discharge Diagnoses:  Active Problems:   AKI (acute kidney injury) ([place])   Chest pain    Discharge Instructions   Allergies as of 11/12/2017   No Known Allergies     Medication List    TAKE these medications   calcium carbonate 750 MG chewable tablet Commonly known as:  TUMS EX Chew 3 tablets by mouth 2 (two) times daily. LUNCH and SUPPER   cephALEXin 500 MG capsule Commonly known as:  KEFLEX Take 1 capsule (500 mg total) by mouth 3 (three) times daily for 3 days.   dorzolamide 2 % ophthalmic solution Commonly known as:  TRUSOPT Place 1 drop into the right eye 2 (two) times daily.   furosemide 80 MG tablet Commonly known as:  LASIX Take 80 mg by mouth daily.   IRON SUPPLEMENT 325 (65 FE) MG tablet Generic drug:  ferrous sulfate Take 325 mg by mouth 2 (two) times daily with a meal. LUNCH and SUPPER   isosorbide mononitrate 30 MG 24 hr tablet Commonly known as:  IMDUR Take 3 tablets (90 mg total) by mouth daily.   latanoprost 0.005 % ophthalmic solution Commonly known as:  XALATAN Place 1 drop into both eyes daily.  5 PM   metoprolol tartrate 100 MG tablet Commonly known as:  LOPRESSOR Take 100 mg by mouth 2 (two) times daily. LUNCH and BEDTIME   sodium bicarbonate 650 MG tablet Take 1 tablet (650  mg total) by mouth 2 (two) times daily.       No Known Allergies  Consultations:     Procedures/Studies: Dg Chest 2 View  Result Date: 11/11/2017 CLINICAL DATA:  Chest pain EXAM: CHEST  2 VIEW COMPARISON:  November 04, 2017 FINDINGS: There is slight scarring in the lung bases. There is no edema or consolidation. The heart size and pulmonary vascularity are normal. No adenopathy. There is aortic atherosclerosis. There is extensive brachial and axillary artery calcification bilaterally. There is degenerative change in the thoracic spine. IMPRESSION: Extensive aortic atherosclerosis. Peripheral arterial vascular calcification bilaterally also noted. No edema or consolidation.  Mild bibasilar scarring. Aortic Atherosclerosis (ICD10-I70.0). Electronically Signed   By: Lowella Grip III M.D.   On: 11/11/2017 11:32   Dg Chest 2 View  Result Date: 11/04/2017 CLINICAL DATA:  Chest pain. EXAM: CHEST  2 VIEW COMPARISON:  And 10/21/2017. FINDINGS: Mediastinum and hilar structures normal. Mild cardiomegaly with normal pulmonary vascularity. No focal infiltrate. Mild left base subsegmental atelectasis. Vascular calcification noted. IMPRESSION: Low lung volumes with mild basilar atelectasis. No acute cardiopulmonary disease. Electronically Signed   By: Marcello Moores  Register   On: 11/04/2017 13:22   Dg Chest 2 View  Result Date: 10/21/2017 CLINICAL DATA:  77 y/o  M; chest pain and shortness of breath. EXAM: CHEST  2 VIEW COMPARISON:  09/20/2017 chest radiograph FINDINGS: Normal cardiac silhouette. Aortic atherosclerosis with calcification. No focal consolidation. No pleural effusion or pneumothorax. No acute osseous abnormality is evident. IMPRESSION: No acute pulmonary process identified. Electronically Signed   By: Kristine Garbe M.D.   On: 10/21/2017 05:55       Subjective: Patient is feeling better, no dyspnea or chest pain, no nausea or vomiting.   Discharge Exam: Vitals:   11/12/17  0600 11/12/17 0908  BP: (!) 139/59 125/61  Pulse: 99 85  Resp: 20   Temp: (!) 97.5 F (36.4 C)   SpO2: 100%    Vitals:   11/11/17 2030 11/11/17 2058 11/12/17 0600 11/12/17 0908  BP: (!) 140/55 (!) 154/58 (!) 139/59 125/61  Pulse: 91 94 99 85  Resp: 18 20 20    Temp:  97.7 F (36.5 C) (!) 97.5 F (36.4 C)   TempSrc:  Axillary Oral   SpO2: 100% 100% 100%   Weight:  73.4 kg (161 lb 12.8 oz) 72.3 kg (159 lb 8 oz)   Height:  5\' 10"  (1.778 m)      General: Pt is alert, awake, not in acute distress E ENT. No pallor or icterus Cardiovascular: RRR, S1/S2 +, no rubs, no gallops Respiratory: CTA bilaterally, no wheezing, no rhonchi Abdominal: Soft, NT, ND, bowel sounds +/ urostomy bag in place.  Extremities: no edema, no cyanosis    The results of significant diagnostics from this hospitalization (including imaging, microbiology, ancillary and laboratory) are listed below for reference.     Microbiology: Recent Results (from the past 240 hour(s))  Urine culture     Status: Abnormal   Collection Time: 11/11/17  2:13 PM  Result Value Ref Range Status   Specimen Description URINE, CLEAN CATCH  Final   Special Requests NONE  Final   Culture MULTIPLE SPECIES PRESENT, SUGGEST RECOLLECTION (A)  Final   Report Status 11/12/2017 FINAL  Final  Labs: BNP (last 3 results) Recent Labs    03/16/17 0259 07/21/17 1416  BNP 680.7* 130.8*   Basic Metabolic Panel: Recent Labs  Lab 11/11/17 1109 11/11/17 2107 11/12/17 0245  NA 135  --  135  K 4.9  --  4.7  CL 101  --  103  CO2 14*  --  14*  GLUCOSE 113*  --  117*  BUN 139*  --  147*  CREATININE 8.40* 8.61* 8.60*  CALCIUM 8.4*  --  7.6*   Liver Function Tests: No results for input(s): AST, ALT, ALKPHOS, BILITOT, PROT, ALBUMIN in the last 168 hours. No results for input(s): LIPASE, AMYLASE in the last 168 hours. No results for input(s): AMMONIA in the last 168 hours. CBC: Recent Labs  Lab 11/11/17 1109 11/11/17 2107  11/12/17 0245  WBC 8.8 8.1 7.6  HGB 10.4* 10.2* 9.3*  HCT 31.1* 30.4* 27.9*  MCV 89.6 91.0 89.7  PLT 136* 136* 137*   Cardiac Enzymes: Recent Labs  Lab 11/11/17 2107 11/12/17 0245 11/12/17 0918  TROPONINI 0.08* 0.07* 0.06*   BNP: Invalid input(s): POCBNP CBG: No results for input(s): GLUCAP in the last 168 hours. D-Dimer No results for input(s): DDIMER in the last 72 hours. Hgb A1c No results for input(s): HGBA1C in the last 72 hours. Lipid Profile No results for input(s): CHOL, HDL, LDLCALC, TRIG, CHOLHDL, LDLDIRECT in the last 72 hours. Thyroid function studies No results for input(s): TSH, T4TOTAL, T3FREE, THYROIDAB in the last 72 hours.  Invalid input(s): FREET3 Anemia work up No results for input(s): VITAMINB12, FOLATE, FERRITIN, TIBC, IRON, RETICCTPCT in the last 72 hours. Urinalysis    Component Value Date/Time   COLORURINE YELLOW 11/11/2017 1413   APPEARANCEUR CLOUDY (A) 11/11/2017 1413   LABSPEC 1.009 11/11/2017 1413   PHURINE 7.0 11/11/2017 1413   GLUCOSEU NEGATIVE 11/11/2017 1413   HGBUR SMALL (A) 11/11/2017 1413   BILIRUBINUR NEGATIVE 11/11/2017 1413   KETONESUR NEGATIVE 11/11/2017 1413   PROTEINUR 100 (A) 11/11/2017 1413   NITRITE NEGATIVE 11/11/2017 1413   LEUKOCYTESUR LARGE (A) 11/11/2017 1413   Sepsis Labs Invalid input(s): PROCALCITONIN,  WBC,  LACTICIDVEN Microbiology Recent Results (from the past 240 hour(s))  Urine culture     Status: Abnormal   Collection Time: 11/11/17  2:13 PM  Result Value Ref Range Status   Specimen Description URINE, CLEAN CATCH  Final   Special Requests NONE  Final   Culture MULTIPLE SPECIES PRESENT, SUGGEST RECOLLECTION (A)  Final   Report Status 11/12/2017 FINAL  Final     Time coordinating discharge: 45 minutes  SIGNED:   Tawni Millers, MD  [facility] 11/12/2017, 11:16 AM Pager [phone]  If 7PM-7AM, please contact night-coverage [email or web address] Password [email or web address]

## 2017-11-16 ENCOUNTER — Other Ambulatory Visit: Payer: Self-pay

## 2017-11-16 ENCOUNTER — Emergency Department (HOSPITAL_COMMUNITY): Payer: Medicare Other

## 2017-11-16 ENCOUNTER — Observation Stay (HOSPITAL_COMMUNITY)
Admission: EM | Admit: 2017-11-16 | Discharge: 2017-11-17 | Disposition: A | Discharge status: Home / Self Care | Payer: Medicare Other | Attending: Internal Medicine | Admitting: Internal Medicine

## 2017-11-16 ENCOUNTER — Encounter (HOSPITAL_COMMUNITY): Payer: Self-pay | Admitting: Emergency Medicine

## 2017-11-16 DIAGNOSIS — J9811 Atelectasis: Secondary | ICD-10-CM | POA: Insufficient documentation

## 2017-11-16 DIAGNOSIS — R310 Gross hematuria: Secondary | ICD-10-CM

## 2017-11-16 DIAGNOSIS — R0789 Other chest pain: Secondary | ICD-10-CM

## 2017-11-16 DIAGNOSIS — Z66 Do not resuscitate: Secondary | ICD-10-CM | POA: Diagnosis not present

## 2017-11-16 DIAGNOSIS — I340 Nonrheumatic mitral (valve) insufficiency: Secondary | ICD-10-CM | POA: Insufficient documentation

## 2017-11-16 DIAGNOSIS — R079 Chest pain, unspecified: Secondary | ICD-10-CM | POA: Diagnosis not present

## 2017-11-16 DIAGNOSIS — Z87891 Personal history of nicotine dependence: Secondary | ICD-10-CM | POA: Diagnosis not present

## 2017-11-16 DIAGNOSIS — R748 Abnormal levels of other serum enzymes: Secondary | ICD-10-CM

## 2017-11-16 DIAGNOSIS — N390 Urinary tract infection, site not specified: Secondary | ICD-10-CM | POA: Diagnosis not present

## 2017-11-16 DIAGNOSIS — E440 Moderate protein-calorie malnutrition: Secondary | ICD-10-CM

## 2017-11-16 DIAGNOSIS — I214 Non-ST elevation (NSTEMI) myocardial infarction: Secondary | ICD-10-CM | POA: Diagnosis present

## 2017-11-16 DIAGNOSIS — Z8546 Personal history of malignant neoplasm of prostate: Secondary | ICD-10-CM | POA: Insufficient documentation

## 2017-11-16 DIAGNOSIS — I129 Hypertensive chronic kidney disease with stage 1 through stage 4 chronic kidney disease, or unspecified chronic kidney disease: Secondary | ICD-10-CM | POA: Diagnosis not present

## 2017-11-16 DIAGNOSIS — I252 Old myocardial infarction: Secondary | ICD-10-CM | POA: Diagnosis not present

## 2017-11-16 DIAGNOSIS — D631 Anemia in chronic kidney disease: Secondary | ICD-10-CM | POA: Insufficient documentation

## 2017-11-16 DIAGNOSIS — N2581 Secondary hyperparathyroidism of renal origin: Secondary | ICD-10-CM | POA: Diagnosis not present

## 2017-11-16 DIAGNOSIS — K5730 Diverticulosis of large intestine without perforation or abscess without bleeding: Secondary | ICD-10-CM | POA: Diagnosis not present

## 2017-11-16 DIAGNOSIS — M8580 Other specified disorders of bone density and structure, unspecified site: Secondary | ICD-10-CM | POA: Diagnosis not present

## 2017-11-16 DIAGNOSIS — N186 End stage renal disease: Principal | ICD-10-CM

## 2017-11-16 DIAGNOSIS — Z79899 Other long term (current) drug therapy: Secondary | ICD-10-CM | POA: Diagnosis not present

## 2017-11-16 DIAGNOSIS — I700 Atherosclerosis of aorta: Secondary | ICD-10-CM | POA: Diagnosis not present

## 2017-11-16 DIAGNOSIS — R7989 Other specified abnormal findings of blood chemistry: Secondary | ICD-10-CM

## 2017-11-16 DIAGNOSIS — Z8551 Personal history of malignant neoplasm of bladder: Secondary | ICD-10-CM | POA: Diagnosis not present

## 2017-11-16 DIAGNOSIS — Z906 Acquired absence of other parts of urinary tract: Secondary | ICD-10-CM | POA: Insufficient documentation

## 2017-11-16 DIAGNOSIS — N289 Disorder of kidney and ureter, unspecified: Secondary | ICD-10-CM | POA: Diagnosis present

## 2017-11-16 DIAGNOSIS — Z992 Dependence on renal dialysis: Secondary | ICD-10-CM | POA: Diagnosis not present

## 2017-11-16 DIAGNOSIS — I10 Essential (primary) hypertension: Secondary | ICD-10-CM | POA: Diagnosis not present

## 2017-11-16 DIAGNOSIS — N184 Chronic kidney disease, stage 4 (severe): Secondary | ICD-10-CM | POA: Diagnosis not present

## 2017-11-16 DIAGNOSIS — Z9115 Patient's noncompliance with renal dialysis: Secondary | ICD-10-CM | POA: Insufficient documentation

## 2017-11-16 DIAGNOSIS — I120 Hypertensive chronic kidney disease with stage 5 chronic kidney disease or end stage renal disease: Secondary | ICD-10-CM | POA: Diagnosis not present

## 2017-11-16 DIAGNOSIS — R778 Other specified abnormalities of plasma proteins: Secondary | ICD-10-CM | POA: Diagnosis present

## 2017-11-16 DIAGNOSIS — D649 Anemia, unspecified: Secondary | ICD-10-CM | POA: Diagnosis not present

## 2017-11-16 DIAGNOSIS — N189 Chronic kidney disease, unspecified: Secondary | ICD-10-CM | POA: Diagnosis not present

## 2017-11-16 HISTORY — DX: Other chronic pain: G89.29

## 2017-11-16 HISTORY — DX: Anemia in chronic kidney disease: N18.9

## 2017-11-16 HISTORY — DX: Personal history of other medical treatment: Z92.89

## 2017-11-16 HISTORY — DX: Recurrent and persistent hematuria with unspecified morphologic changes: N02.9

## 2017-11-16 HISTORY — DX: Prediabetes: R73.03

## 2017-11-16 HISTORY — DX: Cardiac murmur, unspecified: R01.1

## 2017-11-16 HISTORY — DX: End stage renal disease: N18.6

## 2017-11-16 HISTORY — DX: Personal history of urinary calculi: Z87.442

## 2017-11-16 HISTORY — DX: Other artificial openings of urinary tract status: Z93.6

## 2017-11-16 HISTORY — DX: Low back pain, unspecified: M54.50

## 2017-11-16 HISTORY — DX: Noninfective gastroenteritis and colitis, unspecified: K52.9

## 2017-11-16 HISTORY — DX: Anxiety disorder, unspecified: F41.9

## 2017-11-16 HISTORY — DX: Pneumonia, unspecified organism: J18.9

## 2017-11-16 HISTORY — DX: Low back pain: M54.5

## 2017-11-16 HISTORY — DX: Anemia in chronic kidney disease: D63.1

## 2017-11-16 LAB — URINALYSIS, ROUTINE W REFLEX MICROSCOPIC: Squamous Epithelial / LPF: NONE SEEN

## 2017-11-16 LAB — PREPARE RBC (CROSSMATCH)

## 2017-11-16 LAB — CBC WITH DIFFERENTIAL/PLATELET
Basophils Absolute: 0 10*3/uL (ref 0.0–0.1)
Basophils Relative: 0 %
EOS PCT: 3 %
Eosinophils Absolute: 0.2 10*3/uL (ref 0.0–0.7)
HCT: 24.9 % — ABNORMAL LOW (ref 39.0–52.0)
Hemoglobin: 8.3 g/dL — ABNORMAL LOW (ref 13.0–17.0)
LYMPHS ABS: 0.9 10*3/uL (ref 0.7–4.0)
LYMPHS PCT: 12 %
MCH: 30 pg (ref 26.0–34.0)
MCHC: 33.3 g/dL (ref 30.0–36.0)
MCV: 89.9 fL (ref 78.0–100.0)
MONO ABS: 0.4 10*3/uL (ref 0.1–1.0)
MONOS PCT: 5 %
Neutro Abs: 6 10*3/uL (ref 1.7–7.7)
Neutrophils Relative %: 80 %
PLATELETS: 128 10*3/uL — AB (ref 150–400)
RBC: 2.77 MIL/uL — ABNORMAL LOW (ref 4.22–5.81)
RDW: 18 % — AB (ref 11.5–15.5)
WBC: 7.5 10*3/uL (ref 4.0–10.5)

## 2017-11-16 LAB — CULTURE, BLOOD (ROUTINE X 2)
Culture: NO GROWTH
Culture: NO GROWTH
Special Requests: ADEQUATE
Special Requests: ADEQUATE

## 2017-11-16 LAB — BASIC METABOLIC PANEL
Anion gap: 20 — ABNORMAL HIGH (ref 5–15)
BUN: 136 mg/dL — ABNORMAL HIGH (ref 6–20)
CALCIUM: 8.2 mg/dL — AB (ref 8.9–10.3)
CHLORIDE: 101 mmol/L (ref 101–111)
CO2: 14 mmol/L — AB (ref 22–32)
Creatinine, Ser: 9.73 mg/dL — ABNORMAL HIGH (ref 0.61–1.24)
GFR calc non Af Amer: 5 mL/min — ABNORMAL LOW (ref >60)
GFR, EST AFRICAN AMERICAN: 5 mL/min — AB (ref >60)
GLUCOSE: 102 mg/dL — AB (ref 65–99)
Potassium: 5.1 mmol/L (ref 3.5–5.1)
Sodium: 135 mmol/L (ref 135–145)

## 2017-11-16 LAB — TROPONIN I
TROPONIN I: 0.03 ng/mL — AB (ref <0.03)
TROPONIN I: 0.05 ng/mL — AB (ref <0.03)
Troponin I: 0.04 ng/mL (ref <0.03)

## 2017-11-16 MED ORDER — ENSURE ENLIVE PO LIQD
237.0000 mL | Freq: Two times a day (BID) | ORAL | Status: DC
  Administered 2017-11-17 (×2): 237 mL via ORAL

## 2017-11-16 MED ORDER — NEPRO/CARBSTEADY PO LIQD
237.0000 mL | Freq: Two times a day (BID) | ORAL | Status: DC
  Filled 2017-11-16 (×4): qty 237

## 2017-11-16 MED ORDER — ACETAMINOPHEN 325 MG PO TABS: 650.0000 mg | ORAL_TABLET | Freq: Four times a day (QID) | ORAL | Status: DC | PRN

## 2017-11-16 MED ORDER — FERROUS SULFATE 325 (65 FE) MG PO TABS
325.0000 mg | ORAL_TABLET | Freq: Two times a day (BID) | ORAL | Status: DC
  Administered 2017-11-17: 325 mg via ORAL
  Filled 2017-11-16: qty 1

## 2017-11-16 MED ORDER — CALCIUM CARBONATE ANTACID 750 MG PO CHEW: 3.0000 | CHEWABLE_TABLET | Freq: Two times a day (BID) | ORAL | Status: DC

## 2017-11-16 MED ORDER — SODIUM BICARBONATE 650 MG PO TABS
650.0000 mg | ORAL_TABLET | Freq: Two times a day (BID) | ORAL | Status: DC
  Administered 2017-11-17: 650 mg via ORAL
  Filled 2017-11-16 (×2): qty 1

## 2017-11-16 MED ORDER — LATANOPROST 0.005 % OP SOLN
1.0000 [drp] | Freq: Every day | OPHTHALMIC | Status: DC
  Filled 2017-11-16: qty 2.5

## 2017-11-16 MED ORDER — ACETAMINOPHEN 650 MG RE SUPP: 650.0000 mg | Freq: Four times a day (QID) | RECTAL | Status: DC | PRN

## 2017-11-16 MED ORDER — METOPROLOL TARTRATE 100 MG PO TABS
100.0000 mg | ORAL_TABLET | Freq: Two times a day (BID) | ORAL | Status: DC
  Administered 2017-11-16 – 2017-11-17 (×3): 100 mg via ORAL
  Filled 2017-11-16: qty 1
  Filled 2017-11-16: qty 4
  Filled 2017-11-16: qty 1

## 2017-11-16 MED ORDER — SODIUM CHLORIDE 0.9 % IV SOLN: 10.0000 mL/h | Freq: Once | INTRAVENOUS | Status: DC

## 2017-11-16 MED ORDER — SODIUM CHLORIDE 0.9 % IV BOLUS (SEPSIS): 500.0000 mL | Freq: Once | INTRAVENOUS | Status: DC

## 2017-11-16 MED ORDER — PIPERACILLIN-TAZOBACTAM IN DEX 2-0.25 GM/50ML IV SOLN
2.2500 g | Freq: Three times a day (TID) | INTRAVENOUS | Status: DC
  Administered 2017-11-17 (×2): 2.25 g via INTRAVENOUS
  Filled 2017-11-16 (×4): qty 50

## 2017-11-16 MED ORDER — DORZOLAMIDE HCL 2 % OP SOLN
1.0000 [drp] | Freq: Two times a day (BID) | OPHTHALMIC | Status: DC
  Administered 2017-11-16 – 2017-11-17 (×2): 1 [drp] via OPHTHALMIC
  Filled 2017-11-16: qty 10

## 2017-11-16 MED ORDER — ISOSORBIDE MONONITRATE ER 30 MG PO TB24
90.0000 mg | ORAL_TABLET | Freq: Every day | ORAL | Status: DC
  Administered 2017-11-16 – 2017-11-17 (×2): 90 mg via ORAL
  Filled 2017-11-16: qty 1
  Filled 2017-11-16: qty 3

## 2017-11-16 MED ORDER — FUROSEMIDE 80 MG PO TABS
80.0000 mg | ORAL_TABLET | Freq: Every day | ORAL | Status: DC
  Administered 2017-11-16 – 2017-11-17 (×2): 80 mg via ORAL
  Filled 2017-11-16: qty 4
  Filled 2017-11-16: qty 1

## 2017-11-16 MED ORDER — PIPERACILLIN-TAZOBACTAM 3.375 G IVPB 30 MIN
3.3750 g | Freq: Once | INTRAVENOUS | Status: AC
  Administered 2017-11-16: 3.375 g via INTRAVENOUS
  Filled 2017-11-16: qty 50

## 2017-11-16 MED ORDER — CALCIUM CARBONATE ANTACID 500 MG PO CHEW
4.0000 | CHEWABLE_TABLET | Freq: Two times a day (BID) | ORAL | Status: DC
  Administered 2017-11-17: 800 mg via ORAL
  Filled 2017-11-16: qty 4

## 2017-11-16 NOTE — ED Provider Notes (Signed)
  Physical Exam  BP (!) 128/55   Pulse 82   Temp 97.7 F (36.5 C) (Oral)   Resp 15   Ht 5\' 10"  (1.778 m)   Wt 72.6 kg (160 lb)   SpO2 100%   BMI 22.96 kg/m   Physical Exam  ED Course/Procedures     Procedures  MDM  Patient care assumed at 8 am. Patient has hx of ileal conduit done 10 years ago and recently admitted for UTI and was prescribed keflex. Had trouble filling prescription but did take it yesterday. He had hematuria since yesterday and chest pain. Trop 0.04. Worsening renal failure but patient is adamant that he doesn't want dialysis. CT ab/pel pending. Anticipate admission.   9:52 AM CT showed no hydro, just blood in R ureter. Still has gross hematuria in the foley. Hg dropped to 8.3. Given cardiac hx, he states that his Hg is usually kept at 10. Ordered 2 U PRBC transfusion. He refused cardiac cath and just want conservative management. I called urology (Dr. Alinda Money) to follow patient. Dr. Alinda Money doesn't recommend intervention given that he refuses dialysis or cath, just wants transfusion and observation. Hospitalist to admit for anemia, hematuria, elevated trop.   CRITICAL CARE Performed by: Wandra Arthurs   Total critical care time: 35 minutes  Critical care time was exclusive of separately billable procedures and treating other patients.  Critical care was necessary to treat or prevent imminent or life-threatening deterioration.  Critical care was time spent personally by me on the following activities: development of treatment plan with patient and/or surrogate as well as nursing, discussions with consultants, evaluation of patient's response to treatment, examination of patient, obtaining history from patient or surrogate, ordering and performing treatments and interventions, ordering and review of laboratory studies, ordering and review of radiographic studies, pulse oximetry and re-evaluation of patient's condition.      Drenda Freeze, MD 11/16/17 1006

## 2017-11-16 NOTE — H&P (Signed)
History and Physical:    Matthew Mays   UTM:[id number] DOB: 1941/10/15 DOA: 11/16/2017  Referring MD/provider: Dr Darl Householder PCP: Clovia Cuff, MD   Patient coming from: Home  Chief Complaint: Hematuria and chest pain  History of Present Illness:   Matthew Mays is an 77 y.o. male with a past medical history significant for recurrent hematuria in the past, end-stage renal disease for which patient declines dialysis, coronary artery disease being medically managed and hypertension who presents for hematuria and chest pain.  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 He says he's been extensively worked up with no clear source. He was told by urology "it'll start and stop when it once to" and he states "they were right". This episode of hematuria started 3 days ago and he was treating it with his usual hydration and watchful waiting. Last night however patient noted 2 episodes where his urine stopped flowing entirely. He felt fullness and pressure near his ileal conduit at both of these times and notes "it felt like a clot or stone like it did before" but urine subsequently did start flowing again. Patient denies any trauma or any known reason for hematuria.  Patient also complains of intermittent chest pain. He states "it feels like my angina". He was admitted last week for similar chest pains but notes that these episodes are much less intense than they were last week. He was worried that his blood count was dropping and that's why he was having angina. He thought he might need a transfusion. Angina occurred at rest. He self treated with aspirin 325 mg and he notes that the chest pain resolved within 20 minutes which is what it usually does. Patient notes total time of chest pain was about 40 minutes. He has no associated shortness of breath nausea vomiting or diaphoresis. Patient is chest pain-free at present. Of note this is  patient's sixth admission for chest pain in the past 3 months.  Patient also notes that he did not fill his Keflex for his urinary tract infection. Patient is not sure why he was diagnosed with a urinary tract infection but notes he never can tell when he has one. Notes it's usually diagnosed in the hospital when the check his urine. Patient denies feeling particularly fatigued or tired. Patient denies fevers or chills, denies back pain. Patient does admit to "a stronger odor than usual" when he was admitted last week.  Patient continues to decline hemodialysis for his end-stage renal disease. He states that he would rather have quality than quantity of life. States he's been living without dialysis for the past 10 years and he's done fine. Notes that Lasix usually keeps his fluid status under control. He also declines any further workup for his cardiac concerns and prefers to have medical management. He does feel that Imdur has been helpful in managing his angina. We discussed patient's CODE STATUS and he states that "I don't want to live like a vegetable". I noted that if he were to be in cardiac arrest, with his end-stage renal disease and known cardiac disease it would be unlikely for CPR to be effective, patient agrees that he wants to be DO NOT RESUSCITATE, "I don't want to suffer, I don't want to be on a machine".  ED Course:  The patient underwent a CT which showed no cause of his hematuria. Urology was called who said they would calm  and see him in consultation but had no recommendations other than transfusion. EKG was noted to be without change in his troponin was 0.04.  ROS:   ROS   Review of Systems: General: No fever, chills, weight changes Skin: No rashes, lesions, wounds Eyes: no discharge, redness, pain HENT: no ear pain, hearing loss, drainage, tinnitus Endocrine: Patient does admit to cold intolerance times several years Respiratory: Positive cough,, shortness of breath,  hemoptysis Cardiovascular: No palpitations, chest pain GI: Patient does admit to intermittent diarrhea for the past year since he got antibiotics for pneumonia and UTI. Notes he has diarrhea usually once a day and occasionally twice a day. CNS: No numbness, dizziness, headache Musculoskeletal: No back pain, joint pain Mood/affect: Patient thinks his chest pain may be related to anxiety sometimes.  Past Medical History:   Past Medical History:  Diagnosis Date  . Anemia   . Aortic regurgitation 08/13/2017   moderate  . Aortic stenosis 08/13/2017   mild  . Bladder cancer ([place])   . Chronic kidney disease   . H/O sinus tachycardia   . Hypertension   . Mitral regurgitation 08/13/2017   mild  . Non-ST elevation (NSTEMI) myocardial infarction ([place])   . Prediabetes   . Secondary hyperparathyroidism [facility])     Past Surgical History:   Past Surgical History:  Procedure Laterality Date  . ARTERIOVENOUS GRAFT PLACEMENT     left arm AVG  . AV FISTULA PLACEMENT  03/30/2012   Procedure: ARTERIOVENOUS (AV) FISTULA CREATION;  Surgeon: Angelia Mould, MD;  Location: [facility];  Service: Vascular;  Laterality: Left;  . CYSTECTOMY     w/ ileal diversion for bladder cancer   . TONSILLECTOMY      Social History:   Social History   Socioeconomic History  . Marital status: Widowed    Spouse name: Not on file  . Number of children: Not on file  . Years of education: Not on file  . Highest education level: Not on file  Social Needs  . Financial resource strain: Not on file  . Food insecurity - worry: Not on file  . Food insecurity - inability: Not on file  . Transportation needs - medical: Not on file  . Transportation needs - non-medical: Not on file  Occupational History  . Not on file  Tobacco Use  . Smoking status: Former Smoker    Packs/day: 2.00    Years: 40.00    Pack years: 80.00    Types: Cigarettes, Pipe    Last attempt to quit: 03/22/2006    Years since quitting: 11.6    . Smokeless tobacco: Never Used  . Tobacco comment: Stopped smoking 10 years ago.  Substance and Sexual Activity  . Alcohol use: Yes    Alcohol/week: 19.2 oz    Types: 25 Glasses of wine, 7 Cans of beer per week  . Drug use: No  . Sexual activity: No  Other Topics Concern  . Not on file  Social History Narrative  . Not on file    Allergies   Patient has no known allergies.  Family history:   Family History  Problem Relation Age of Onset  . Cancer Mother        BRAIN    Current Medications:   Prior to Admission medications   Medication Sig Start Date End Date Taking? Authorizing Provider  calcium carbonate (TUMS EX) 750 MG chewable tablet Chew 3 tablets by mouth 2 (two) times daily. LUNCH and SUPPER  Yes [provider]  cephALEXin (KEFLEX) 500 MG capsule Take 500 mg by mouth 3 (three) times daily.   Yes [provider]  dorzolamide (TRUSOPT) 2 % ophthalmic solution Place 1 drop into the right eye 2 (two) times daily.    Yes [provider]  ferrous sulfate (IRON SUPPLEMENT) 325 (65 FE) MG tablet Take 325 mg by mouth 2 (two) times daily with a meal. LUNCH and SUPPER   Yes [provider]  furosemide (LASIX) 80 MG tablet Take 80 mg by mouth daily.    Yes [provider]  isosorbide mononitrate (IMDUR) 30 MG 24 hr tablet Take 3 tablets (90 mg total) by mouth daily. 09/22/17  Yes Vann, Jessica U, DO  latanoprost (XALATAN) 0.005 % ophthalmic solution Place 1 drop into both eyes daily. 5 PM   Yes [provider]  metoprolol tartrate (LOPRESSOR) 100 MG tablet Take 100 mg by mouth 2 (two) times daily. LUNCH and BEDTIME   Yes [provider]  sodium bicarbonate 650 MG tablet Take 1 tablet (650 mg total) by mouth 2 (two) times daily. 10/23/17 11/22/17 Yes Florencia Reasons, MD    Physical Exam:   Vitals:   11/16/17 0545 11/16/17 0600 11/16/17 0615 11/16/17 0700  BP: (!) 128/54 (!) 120/97 (!) 126/56 (!) 128/55  Pulse: (!) 42  79 82   Resp: 12 14 15    Temp:      TempSrc:      SpO2: 100% 100% 100%   Weight:      Height:         Physical Exam: Blood pressure (!) 128/55, pulse 82, temperature 97.7 F (36.5 C), temperature source Oral, resp. rate 15, height 5\' 10"  (1.778 m), weight 72.6 kg (160 lb), SpO2 100 %. Gen: Chronically ill-appearing man lying in bed in no apparent distress, he is friendly, talkative and cooperative. Strong smell of urine, no clear uremic odor. Eyes: Sclerae anicteric. Conjunctiva mildly injected. Neck: Supple, no jugular venous distention. Chest: Moderately good air entry bilaterally a few rales at the right base which did not clear with cough. No consolidation by exam. CV: Distant, regular Abdomen: NABS, soft, nondistended, nontender. Ileal conduit draining dark red urine freely.  Extremities: He has no lower extremity edema Skin: Patient with multiple ecchymoses on his right antecubital area near his IV, he's got some scratches on his left forearm  Neuro: Alert and oriented times 3; grossly nonfocal. Psych: Patient is cooperative, logical and coherent with appropriate mood and affect.  Data Review:    Labs: Basic Metabolic Panel: Recent Labs  Lab 11/11/17 1109 11/11/17 2107 11/12/17 0245 11/16/17 0521  NA 135  --  135 135  K 4.9  --  4.7 5.1  CL 101  --  103 101  CO2 14*  --  14* 14*  GLUCOSE 113*  --  117* 102*  BUN 139*  --  147* 136*  CREATININE 8.40* 8.61* 8.60* 9.73*  CALCIUM 8.4*  --  7.6* 8.2*   Liver Function Tests: No results for input(s): AST, ALT, ALKPHOS, BILITOT, PROT, ALBUMIN in the last 168 hours. No results for input(s): LIPASE, AMYLASE in the last 168 hours. No results for input(s): AMMONIA in the last 168 hours. CBC: Recent Labs  Lab 11/11/17 1109 11/11/17 2107 11/12/17 0245 11/16/17 0521  WBC 8.8 8.1 7.6 7.5  NEUTROABS  --   --   --  6.0  HGB 10.4* 10.2* 9.3* 8.3*  HCT 31.1* 30.4* 27.9* 24.9*  MCV 89.6  91.0 89.7 89.9  PLT 136* 136* 137*  128*   Cardiac Enzymes: Recent Labs  Lab 11/11/17 2107 11/12/17 0245 11/12/17 0918 11/16/17 0521  TROPONINI 0.08* 0.07* 0.06* 0.04*    BNP (last 3 results) No results for input(s): PROBNP in the last 8760 hours. CBG: No results for input(s): GLUCAP in the last 168 hours.  Urinalysis    Component Value Date/Time   COLORURINE RED (A) 11/16/2017 0545   APPEARANCEUR BLOODY (A) 11/16/2017 0545   LABSPEC  11/16/2017 0545    TEST NOT REPORTED DUE TO COLOR INTERFERENCE OF URINE PIGMENT   PHURINE  11/16/2017 0545    TEST NOT REPORTED DUE TO COLOR INTERFERENCE OF URINE PIGMENT   GLUCOSEU (A) 11/16/2017 0545    TEST NOT REPORTED DUE TO COLOR INTERFERENCE OF URINE PIGMENT   HGBUR (A) 11/16/2017 0545    TEST NOT REPORTED DUE TO COLOR INTERFERENCE OF URINE PIGMENT   BILIRUBINUR (A) 11/16/2017 0545    TEST NOT REPORTED DUE TO COLOR INTERFERENCE OF URINE PIGMENT   KETONESUR (A) 11/16/2017 0545    TEST NOT REPORTED DUE TO COLOR INTERFERENCE OF URINE PIGMENT   PROTEINUR (A) 11/16/2017 0545    TEST NOT REPORTED DUE TO COLOR INTERFERENCE OF URINE PIGMENT   NITRITE (A) 11/16/2017 0545    TEST NOT REPORTED DUE TO COLOR INTERFERENCE OF URINE PIGMENT   LEUKOCYTESUR (A) 11/16/2017 0545    TEST NOT REPORTED DUE TO COLOR INTERFERENCE OF URINE PIGMENT      Radiographic Studies: Ct Abdomen Pelvis Wo Contrast  Result Date: 11/16/2017 CLINICAL DATA:  Recent kidney infection. Gross hematuria. History of prostate cancer. EXAM: CT ABDOMEN AND PELVIS WITHOUT CONTRAST TECHNIQUE: Multidetector CT imaging of the abdomen and pelvis was performed following the standard protocol without IV contrast. COMPARISON:  CT 07/21/2012 FINDINGS: Lower chest: No acute abnormality. Dense coronary artery and valvular calcifications. Calcifications within the left ventricle likely involving the papillary muscles and may be related to prior infarct. No effusions. Hepatobiliary: No focal hepatic abnormality. Gallbladder  unremarkable. Pancreas: No focal abnormality or ductal dilatation. Spleen: No focal abnormality.  Normal size. Adrenals/Urinary Tract: Bilateral renal atrophy and cortical thinning. Numerous low-density lesions within the kidneys bilaterally, likely small cysts. High-density material noted within the right renal collecting system, pelvis and ureter, likely hematuria. No hydronephrosis. Right lower quadrant ileal conduit noted. Prior cystectomy. Adrenal glands unremarkable. Stomach/Bowel: Sigmoid and descending colonic diverticulosis. No active diverticulitis. Stomach and small bowel decompressed, unremarkable. Vascular/Lymphatic: Diffuse aortic, iliac, and branch vessel calcifications. No adenopathy. Reproductive: No visible focal abnormality. Other: No free fluid or free air. Musculoskeletal: No acute bony abnormality. IMPRESSION: Right lower quadrant ileal conduit noted. There is no hydronephrosis. High-density urine noted within the right renal collecting system and renal pelvis as well as the right ureter compatible with hematuria. Exact cause not visualized. No visible stones. Extensive renovascular calcifications. Sigmoid diverticulosis.  No active diverticulitis. Severe arterial atherosclerosis. Electronically Signed   By: Rolm Baptise M.D.   On: 11/16/2017 09:26   Dg Chest 2 View  Result Date: 11/16/2017 CLINICAL DATA:  Initial evaluation for acute upper chest pain. EXAM: CHEST  2 VIEW COMPARISON:  Prior radiograph from 11/11/2017. FINDINGS: Mild cardiomegaly, stable. Mediastinal silhouette within normal limits. Extensive aortic atherosclerosis again noted. Extensive peripheral calcifications noted as well. Lungs normally inflated. Mild bibasilar scattered bibasilar scarring and/or fibrosis. No focal infiltrates. No pulmonary edema or pleural effusion. No pneumothorax. No acute osseous abnormality.  Osteopenia noted. IMPRESSION: 1. Stable appearance of the  chest with no active cardiopulmonary disease  identified. 2. Extensive calcific aortic atherosclerosis. Electronically Signed   By: Jeannine Boga M.D.   On: 11/16/2017 05:25    EKG: Independently reviewed. NSR RBBB w/o change, poor baseline    Assessment/Plan:   Active Problems:   Hematuria, gross  HEMATURIA Patient with known history of hematuria with negative workup. Notes his last episode was 3-4 years ago but prior to that he had yearly if not more hematuria. At present his urine is flowing freely but it sounds like he may have had a clot formation a couple of times last night when his urine stopped for about 5 hours each time. Will await instructions re irrigation if at all required per urology. Will transfuse 2 units PRBC.  CHEST PAIN Troponin is minimally elevated at 0.04 some of which is attributable to his renal failure He did have a minimal trending up of troponin w peak 0.08 last week, so will follow protocol and r/o for MI.  He has had ASA 325 mg which he administered himself yesterday  ESRD Patient is euvolemic but we will need to follow volume status closely given he is getting 2 units of PRBC. Will continue his usual oral Lasix 80 mg by mouth daily, may need more given fluid load. Has metabolic acidosis as is to be expected with an anion gap of 20. Of note he only takes his bicarb twice a day, would increase to tid   UTI VS COLONIZATION It is unclear to me if patient has a urinary tract infection versus colonization. He was started on antibiotics for UTI last week but did not fill his medications as an outpatient, he was started on Zosyn here today. We'll continue Zosyn for now, this can be discontinued as warranted.  HTN I will continue his metoprolol despite his hematuria  He will need his beta blocker to decrease myocardial o2 consumption if he is having ischemia Also he has maintained his BP throughout   DIARRHEA Patient should start probiotics as an outpatient.  Patient is DO NOT  RESUSCITATE  Other information:   DVT prophylaxis: Lovenox ordered. Code Status: DNR Family Communication: Patient has no family with him to communicate Disposition Plan: Home Consults called: Urology Admission status: Observation   The medical decision making on this patient was of high complexity and the patient is at high risk for clinical deterioration, therefore this is a level 3 visit.   Dewaine Oats Derek Jack [facility] Pager [phone] Cell: [phone]   If 7PM-7AM, please contact night-coverage [email or web address] [facility] 11/16/2017, 10:56 AM

## 2017-11-16 NOTE — ED Triage Notes (Signed)
Per EMS pt coming from home with complaints of chest pain starting around 0330. Pt stats he had no urine output today but has recently had blood in urine.

## 2017-11-16 NOTE — Progress Notes (Signed)
Pharmacy Antibiotic Note  Matthew Mays is a 77 y.o. male admitted on 11/16/2017 with sepsis and UTI.  Pharmacy has been consulted for zosyn dosing. Pt is afebrile and WBC is WNL. SCr is elevated above baseline at 9.73.   Plan: Zosyn 3.375gm IV x 1 then 2.25gm IV Q8H F/u renal fxn, C&S, clinical status De-escalate as able  Height: 5\' 10"  (177.8 cm) Weight: 160 lb (72.6 kg) IBW/kg (Calculated) : 73  Temp (24hrs), Avg:97.7 F (36.5 C), Min:97.7 F (36.5 C), Max:97.7 F (36.5 C)  Recent Labs  Lab 11/11/17 1109 11/11/17 2107 11/12/17 0245 11/16/17 0521  WBC 8.8 8.1 7.6 7.5  CREATININE 8.40* 8.61* 8.60* 9.73*    Estimated Creatinine Clearance: 6.6 mL/min (A) (by C-G formula based on SCr of 9.73 mg/dL (H)).    No Known Allergies  Antimicrobials this admission: Zosyn 1/23>>  Dose adjustments this admission: N/A  Microbiology results: Pending  Thank you for allowing pharmacy to be a part of this patient's care.  Damir Leung, Rande Lawman 11/16/2017 9:46 AM

## 2017-11-16 NOTE — Progress Notes (Signed)
Patient ID: Matthew Mays, male   DOB: September 22, 1941, 77 y.o.   MRN: [id number]  Urology Consult   Physician requesting consult: Dr. Darl Householder  Reason for consult: Hematuria  History of Present Illness: Matthew Mays is a 77 y.o. 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  This was performed by Dr. Tresa Endo.  Beginning a few years after surgery, he had developed intermittent hematuria and underwent a fairly extensive evaluation including imaging, cytology, etc.  His evaluation was negative for recurrent malignancy.  Dr. Kathie Rhodes assumed his care after Dr. Rosana Hoes left [place].  Mr. [name] continued to have intermittent gross hematuria.  It was felt that the origin of his hematuria was from his right kidney although no definite etiology was discovered.  There was some thought that this may be related to a vascular abnormality although he was unable to undergo an arteriogram due to his significant renal dysfunction.  He has not had significant bleeding for approximately 5 years and was last evaluated by Dr. Karsten Ro in 2013.  His urine cytology at that time was negative.  He recently was admitted to the hospital for a febrile urinary tract infection and developing sepsis.  He subsequently improved.  However he had developed angina at that time and again redeveloped angina recently resulting in admission to the hospital.  In addition, he has developed a 3-day history of significant gross hematuria.  His urine has been dark red but has been draining well aside from a short episode of 5-6 hours where he had minimal urine output.  He has denied recent fevers.  He may have had some mild right-sided flank pain although not significant.  Currently, he has minimal complaints.  Mr. [name] also has had progressive severe renal dysfunction.  His serum creatinine most recently has been above 9.  He has  adamantly refused to have dialysis.  Past Medical History:  Diagnosis Date  . Anemia   . Anemia, chronic renal failure   . Anxiety   . Aortic regurgitation 08/13/2017   moderate  . Aortic stenosis 08/13/2017   mild  . Bladder cancer ([place])   . Chronic diarrhea    "I've had it for 10 years" (11/16/2017)  . Chronic lower back pain   . ESRD (end stage renal disease) ([place])    [name] 11/16/2017  . H/O sinus tachycardia   . Heart murmur   . History of blood transfusion "several"   "low blood; having #12 right now" (11/16/2017)  . History of kidney stones   . Hypertension   . Mitral regurgitation 08/13/2017   mild  . Non-ST elevation (NSTEMI) myocardial infarction ([place])   . Pneumonia 2018 X 1  . Pre-diabetes   . Presence of urostomy ([place])   . Recurrent hematuria    [name] 11/16/2017 (11/16/2017)  . Secondary hyperparathyroidism [facility])     Past Surgical History:  Procedure Laterality Date  . ARTERIOVENOUS GRAFT PLACEMENT Left 12/2005   [name] 03/09/2011  . AV FISTULA PLACEMENT  03/30/2012   Procedure: ARTERIOVENOUS (AV) FISTULA CREATION;  Surgeon: Angelia Mould, MD;  Location: [facility];  Service: Vascular;  Laterality: Left;  . CYSTECTOMY     w/ ileal diversion for bladder cancer   . PROSTATECTOMY    . TONSILLECTOMY    . URETHRECTOMY  12/2005   [name] 03/09/2011    Current Hospital Medications:  Home Meds:  Current Meds  Medication Sig  . calcium carbonate (TUMS EX) 750 MG chewable tablet Chew 3 tablets by mouth 2 (two) times daily. LUNCH and SUPPER  . cephALEXin (KEFLEX) 500 MG capsule Take 500 mg by mouth 3 (three) times daily.  . dorzolamide (TRUSOPT) 2 % ophthalmic solution Place 1 drop into the right eye 2 (two) times daily.   . ferrous sulfate (IRON SUPPLEMENT) 325 (65 FE) MG tablet Take 325 mg by mouth 2 (two) times daily with a meal. LUNCH and SUPPER  . furosemide (LASIX) 80 MG tablet Take 80 mg by mouth daily.   . isosorbide mononitrate (IMDUR) 30 MG 24 hr  tablet Take 3 tablets (90 mg total) by mouth daily.  [name] latanoprost (XALATAN) 0.005 % ophthalmic solution Place 1 drop into both eyes daily. 5 PM  . metoprolol tartrate (LOPRESSOR) 100 MG tablet Take 100 mg by mouth 2 (two) times daily. LUNCH and BEDTIME  . sodium bicarbonate 650 MG tablet Take 1 tablet (650 mg total) by mouth 2 (two) times daily.    Scheduled Meds: . calcium carbonate  4 tablet Oral BID WC  . dorzolamide  1 drop Right Eye BID  . [START ON 11/17/2017] feeding supplement (ENSURE ENLIVE)  237 mL Oral BID BM  . [START ON 11/17/2017] feeding supplement (NEPRO CARB STEADY)  237 mL Oral BID BM  . ferrous sulfate  325 mg Oral BID WC  . furosemide  80 mg Oral Daily  . isosorbide mononitrate  90 mg Oral Daily  . latanoprost  1 drop Both Eyes Daily  . metoprolol tartrate  100 mg Oral BID  . sodium bicarbonate  650 mg Oral BID   Continuous Infusions: . sodium chloride    . piperacillin-tazobactam (ZOSYN)  IV     PRN Meds:.acetaminophen **OR** acetaminophen  Allergies: No Known Allergies  Family History  Problem Relation Age of Onset  . Cancer Mother        BRAIN    Social History:  reports that he quit smoking about 11 years ago. His smoking use included cigarettes and pipe. He has a 141.00 pack-year smoking history. he has never used smokeless tobacco. He reports that he drinks about 17.4 oz of alcohol per week. He reports that he does not use drugs.  ROS: A complete review of systems was performed.  All systems are negative except for pertinent findings as noted.  Physical Exam:  Vital signs in last 24 hours: Temp:  [97.5 F (36.4 C)-98.4 F (36.9 C)] 97.6 F (36.4 C) (01/23 1819) Pulse Rate:  [42-94] 88 (01/23 1819) Resp:  [7-22] 18 (01/23 1819) BP: (112-172)/(8-158) 149/65 (01/23 1819) SpO2:  [100 %] 100 % (01/23 1819) Weight:  [72.6 kg (160 lb)] 72.6 kg (160 lb) (01/23 0447) Constitutional:  Alert and oriented, No acute distress Cardiovascular: Regular rate  and rhythm, No JVD Respiratory: Normal respiratory effort, Lungs clear bilaterally GI: Abdomen is soft, nontender, nondistended, no abdominal masses.  His ileal conduit is pink and patent.  He appears to have dark red blood in his bag and draining through his catheter tubing.  No definite clots are identified. GU: Mild right CVA tenderness. Lymphatic: No lymphadenopathy Neurologic: Grossly intact, no focal deficits Psychiatric: Normal mood and affect  Laboratory Data:  Recent Labs    11/16/17 0521  WBC 7.5  HGB 8.3*  HCT 24.9*  PLT 128*    Recent Labs    11/16/17 0521  NA 135  K 5.1  CL 101  GLUCOSE 102*  BUN  136*  CALCIUM 8.2*  CREATININE 9.73*     Results for orders placed or performed during the hospital encounter of 11/16/17 (from the past 24 hour(s))  CBC with Differential     Status: Abnormal   Collection Time: 11/16/17  5:21 AM  Result Value Ref Range   WBC 7.5 4.0 - 10.5 K/uL   RBC 2.77 (L) 4.22 - 5.81 MIL/uL   Hemoglobin 8.3 (L) 13.0 - 17.0 g/dL   HCT 24.9 (L) 39.0 - 52.0 %   MCV 89.9 78.0 - 100.0 fL   MCH 30.0 26.0 - 34.0 pg   MCHC 33.3 30.0 - 36.0 g/dL   RDW 18.0 (H) 11.5 - 15.5 %   Platelets 128 (L) 150 - 400 K/uL   Neutrophils Relative % 80 %   Neutro Abs 6.0 1.7 - 7.7 K/uL   Lymphocytes Relative 12 %   Lymphs Abs 0.9 0.7 - 4.0 K/uL   Monocytes Relative 5 %   Monocytes Absolute 0.4 0.1 - 1.0 K/uL   Eosinophils Relative 3 %   Eosinophils Absolute 0.2 0.0 - 0.7 K/uL   Basophils Relative 0 %   Basophils Absolute 0.0 0.0 - 0.1 K/uL  Basic metabolic panel     Status: Abnormal   Collection Time: 11/16/17  5:21 AM  Result Value Ref Range   Sodium 135 135 - 145 mmol/L   Potassium 5.1 3.5 - 5.1 mmol/L   Chloride 101 101 - 111 mmol/L   CO2 14 (L) 22 - 32 mmol/L   Glucose, Bld 102 (H) 65 - 99 mg/dL   BUN 136 (H) 6 - 20 mg/dL   Creatinine, Ser 9.73 (H) 0.61 - 1.24 mg/dL   Calcium 8.2 (L) 8.9 - 10.3 mg/dL   GFR calc non Af Amer 5 (L) >60 mL/min   GFR  calc Af Amer 5 (L) >60 mL/min   Anion gap 20 (H) 5 - 15  Troponin I     Status: Abnormal   Collection Time: 11/16/17  5:21 AM  Result Value Ref Range   Troponin I 0.04 (HH) <0.03 ng/mL  Urinalysis, Routine w reflex microscopic     Status: Abnormal   Collection Time: 11/16/17  5:45 AM  Result Value Ref Range   Color, Urine RED (A) YELLOW   APPearance BLOODY (A) CLEAR   Specific Gravity, Urine  1.005 - 1.030    TEST NOT REPORTED DUE TO COLOR INTERFERENCE OF URINE PIGMENT   pH  5.0 - 8.0    TEST NOT REPORTED DUE TO COLOR INTERFERENCE OF URINE PIGMENT   Glucose, UA (A) NEGATIVE mg/dL    TEST NOT REPORTED DUE TO COLOR INTERFERENCE OF URINE PIGMENT   Hgb urine dipstick (A) NEGATIVE    TEST NOT REPORTED DUE TO COLOR INTERFERENCE OF URINE PIGMENT   Bilirubin Urine (A) NEGATIVE    TEST NOT REPORTED DUE TO COLOR INTERFERENCE OF URINE PIGMENT   Ketones, ur (A) NEGATIVE mg/dL    TEST NOT REPORTED DUE TO COLOR INTERFERENCE OF URINE PIGMENT   Protein, ur (A) NEGATIVE mg/dL    TEST NOT REPORTED DUE TO COLOR INTERFERENCE OF URINE PIGMENT   Nitrite (A) NEGATIVE    TEST NOT REPORTED DUE TO COLOR INTERFERENCE OF URINE PIGMENT   Leukocytes, UA (A) NEGATIVE    TEST NOT REPORTED DUE TO COLOR INTERFERENCE OF URINE PIGMENT   RBC / HPF TOO NUMEROUS TO COUNT 0 - 5 RBC/hpf   WBC, UA TOO NUMEROUS TO COUNT 0 - 5 WBC/hpf  Bacteria, UA RARE (A) NONE SEEN   Squamous Epithelial / LPF NONE SEEN NONE SEEN  Type and screen [place]     Status: None (Preliminary result)   Collection Time: 11/16/17  6:44 AM  Result Value Ref Range   ABO/RH(D) A POS    Antibody Screen NEG    Sample Expiration 11/19/2017    Unit Number [id number]    Blood Component Type RBC LR PHER1    Unit division 00    Status of Unit ISSUED    Transfusion Status OK TO TRANSFUSE    Crossmatch Result Compatible    Unit Number [id number]    Blood Component Type RED CELLS,LR    Unit division 00    Status of Unit  ISSUED    Transfusion Status OK TO TRANSFUSE    Crossmatch Result Compatible   Prepare RBC     Status: None   Collection Time: 11/16/17  9:42 AM  Result Value Ref Range   Order Confirmation ORDER PROCESSED BY BLOOD BANK   Troponin I     Status: Abnormal   Collection Time: 11/16/17 11:18 AM  Result Value Ref Range   Troponin I 0.03 (HH) <0.03 ng/mL   Recent Results (from the past 240 hour(s))  Urine culture     Status: Abnormal   Collection Time: 11/11/17  2:13 PM  Result Value Ref Range Status   Specimen Description URINE, CLEAN CATCH  Final   Special Requests NONE  Final   Culture MULTIPLE SPECIES PRESENT, SUGGEST RECOLLECTION (A)  Final   Report Status 11/12/2017 FINAL  Final  Culture, blood (routine x 2)     Status: None   Collection Time: 11/11/17  9:11 PM  Result Value Ref Range Status   Specimen Description BLOOD RIGHT ANTECUBITAL  Final   Special Requests IN PEDIATRIC BOTTLE Blood Culture adequate volume  Final   Culture NO GROWTH 5 DAYS  Final   Report Status 11/16/2017 FINAL  Final  Culture, blood (routine x 2)     Status: None   Collection Time: 11/11/17  9:22 PM  Result Value Ref Range Status   Specimen Description BLOOD RIGHT HAND  Final   Special Requests IN PEDIATRIC BOTTLE Blood Culture adequate volume  Final   Culture NO GROWTH 5 DAYS  Final   Report Status 11/16/2017 FINAL  Final    Renal Function: Recent Labs    11/11/17 1109 11/11/17 2107 11/12/17 0245 11/16/17 0521  CREATININE 8.40* 8.61* 8.60* 9.73*   Estimated Creatinine Clearance: 6.6 mL/min (A) (by C-G formula based on SCr of 9.73 mg/dL (H)).  Radiologic Imaging: Ct Abdomen Pelvis Wo Contrast  Result Date: 11/16/2017 CLINICAL DATA:  Recent kidney infection. Gross hematuria. History of prostate cancer. EXAM: CT ABDOMEN AND PELVIS WITHOUT CONTRAST TECHNIQUE: Multidetector CT imaging of the abdomen and pelvis was performed following the standard protocol without IV contrast. COMPARISON:  CT  07/21/2012 FINDINGS: Lower chest: No acute abnormality. Dense coronary artery and valvular calcifications. Calcifications within the left ventricle likely involving the papillary muscles and may be related to prior infarct. No effusions. Hepatobiliary: No focal hepatic abnormality. Gallbladder unremarkable. Pancreas: No focal abnormality or ductal dilatation. Spleen: No focal abnormality.  Normal size. Adrenals/Urinary Tract: Bilateral renal atrophy and cortical thinning. Numerous low-density lesions within the kidneys bilaterally, likely small cysts. High-density material noted within the right renal collecting system, pelvis and ureter, likely hematuria. No hydronephrosis. Right lower quadrant ileal conduit noted. Prior cystectomy. Adrenal glands unremarkable. Stomach/Bowel: Sigmoid  and descending colonic diverticulosis. No active diverticulitis. Stomach and small bowel decompressed, unremarkable. Vascular/Lymphatic: Diffuse aortic, iliac, and branch vessel calcifications. No adenopathy. Reproductive: No visible focal abnormality. Other: No free fluid or free air. Musculoskeletal: No acute bony abnormality. IMPRESSION: Right lower quadrant ileal conduit noted. There is no hydronephrosis. High-density urine noted within the right renal collecting system and renal pelvis as well as the right ureter compatible with hematuria. Exact cause not visualized. No visible stones. Extensive renovascular calcifications. Sigmoid diverticulosis.  No active diverticulitis. Severe arterial atherosclerosis. Electronically Signed   By: Rolm Baptise M.D.   On: 11/16/2017 09:26   Dg Chest 2 View  Result Date: 11/16/2017 CLINICAL DATA:  Initial evaluation for acute upper chest pain. EXAM: CHEST  2 VIEW COMPARISON:  Prior radiograph from 11/11/2017. FINDINGS: Mild cardiomegaly, stable. Mediastinal silhouette within normal limits. Extensive aortic atherosclerosis again noted. Extensive peripheral calcifications noted as well. Lungs  normally inflated. Mild bibasilar scattered bibasilar scarring and/or fibrosis. No focal infiltrates. No pulmonary edema or pleural effusion. No pneumothorax. No acute osseous abnormality.  Osteopenia noted. IMPRESSION: 1. Stable appearance of the chest with no active cardiopulmonary disease identified. 2. Extensive calcific aortic atherosclerosis. Electronically Signed   By: Jeannine Boga M.D.   On: 11/16/2017 05:25    I independently reviewed the above imaging studies.  Impression/Recommendation 1.  Gross hematuria: Mr. Poage has a long-standing history of recurrent hematuria since radical cystectomy.  However, no definite etiology has been identified.  His last full evaluation was approximately 5 years ago.  Standard recommendation would be to consider further evaluation including imaging with a loopogram versus nephrostomy tube placement and nephrostogram considering his inability to receive IV contrast.  However, in light of his overall prognosis considering his other medical comorbidities and severe renal failure with a clear desire to avoid dialysis, it is unclear whether proceeding with an aggressive evaluation of his hematuria would be necessary in the absence of acute problems related to it.  Currently, he is agreeable to proceed with palliative measures including management of his anemia will be the blood transfusions or other means.  He does not wish to proceed with further evaluation because of his hematuria at this time.  Therefore, I would recommend that he follow-up as an outpatient with Dr. Karsten Ro for further discussion regarding his options for evaluation.  Please call if there are further questions during his hospitalization.  Jene Oravec,LES 11/16/2017, 7:55 PM    Pryor Curia MD   CC: Dr. Darl Householder

## 2017-11-16 NOTE — ED Provider Notes (Signed)
[facility] EMERGENCY DEPARTMENT Provider Note   CSN: [id number] Arrival date & time: 11/16/17  9924     History   Chief Complaint Chief Complaint  Patient presents with  . Chest Pain  . Hematuria    HPI Matthew Mays is a 77 y.o. male.  HPI  This is a 77 year old male with a history of anemia with multiple transfusions, chronic kidney disease, bladder cancer status post ileal conduit, coronary artery disease who presents with with chest pain.  Patient reports onset of chest pain just prior to arrival.  He states that he felt very anxious and had anterior sharp chest pain.  He gets this frequently.  He is currently chest pain-free.  He states "it feels like my angina."  Additionally, patient states that he noted blood in his catheter bag yesterday.  Overall he reports decreased urine output and progressively more bloody urine.  He was admitted to the hospital on January 18 for chest pain rule out.  At that time it was thought that his urine may be somewhat infected.  He was given Zosyn and discharged home with antibiotics.  He only took one dose on Monday.  He has not had any fevers or back pain.  Urine culture from that visit grew out multiple species recommend recollection.  Past Medical History:  Diagnosis Date  . Anemia   . Aortic regurgitation 08/13/2017   moderate  . Aortic stenosis 08/13/2017   mild  . Bladder cancer ([place])   . Chronic kidney disease   . H/O sinus tachycardia   . Hypertension   . Mitral regurgitation 08/13/2017   mild  . Non-ST elevation (NSTEMI) myocardial infarction ([place])   . Prediabetes   . Secondary hyperparathyroidism [facility])     Patient Active Problem List   Diagnosis Date Noted  . Chest pain 10/22/2017  . 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 Palliative care by specialist   . Advance care planning   . Non-ST elevation (NSTEMI) myocardial infarction ([place])   . Atypical chest pain 08/12/2017    . Diarrhea 07/22/2017  . Hyponatremia 07/22/2017  . Hypokalemia 07/22/2017  . Elevated troponin 07/22/2017  . Chest discomfort 07/22/2017  . Hyperkalemia 07/22/2017  . Anemia 07/21/2017  . Glaucoma 07/21/2017  . UTI (lower urinary tract infection) 06/19/2016  . Sepsis ([place]) 06/19/2016  . Hyperglycemia 06/19/2016  . Hypertension 06/19/2016  . AKI (acute kidney injury) ([place]) 06/19/2016  . CKD (chronic kidney disease), stage V ([place]) 06/19/2016  . End stage renal disease ([place]) 03/22/2012    Past Surgical History:  Procedure Laterality Date  . ARTERIOVENOUS GRAFT PLACEMENT     left arm AVG  . AV FISTULA PLACEMENT  03/30/2012   Procedure: ARTERIOVENOUS (AV) FISTULA CREATION;  Surgeon: Angelia Mould, MD;  Location: [facility];  Service: Vascular;  Laterality: Left;  . CYSTECTOMY     w/ ileal diversion for bladder cancer   . TONSILLECTOMY         Home Medications    Prior to Admission medications   Medication Sig Start Date End Date Taking? Authorizing Provider  calcium carbonate (TUMS EX) 750 MG chewable tablet Chew 3 tablets by mouth 2 (two) times daily. LUNCH and SUPPER   Yes [provider]  cephALEXin (KEFLEX) 500 MG capsule Take 500 mg by mouth 3 (three) times daily.   Yes [provider]  dorzolamide (TRUSOPT) 2 % ophthalmic solution Place 1 drop into the right eye  2 (two) times daily.    Yes [provider]  ferrous sulfate (IRON SUPPLEMENT) 325 (65 FE) MG tablet Take 325 mg by mouth 2 (two) times daily with a meal. LUNCH and SUPPER   Yes [provider]  furosemide (LASIX) 80 MG tablet Take 80 mg by mouth daily.    Yes [provider]  isosorbide mononitrate (IMDUR) 30 MG 24 hr tablet Take 3 tablets (90 mg total) by mouth daily. 09/22/17  Yes Vann, Jessica U, DO  latanoprost (XALATAN) 0.005 % ophthalmic solution Place 1 drop into both eyes daily. 5 PM   Yes [provider]  metoprolol tartrate (LOPRESSOR) 100 MG  tablet Take 100 mg by mouth 2 (two) times daily. LUNCH and BEDTIME   Yes [provider]  sodium bicarbonate 650 MG tablet Take 1 tablet (650 mg total) by mouth 2 (two) times daily. 10/23/17 11/22/17 Yes Florencia Reasons, MD    Family History Family History  Problem Relation Age of Onset  . Cancer Mother        BRAIN    Social History Social History   Tobacco Use  . Smoking status: Former Smoker    Packs/day: 2.00    Years: 40.00    Pack years: 80.00    Types: Cigarettes, Pipe    Last attempt to quit: 03/22/2006    Years since quitting: 11.6  . Smokeless tobacco: Never Used  . Tobacco comment: Stopped smoking 10 years ago.  Substance Use Topics  . Alcohol use: Yes    Alcohol/week: 19.2 oz    Types: 25 Glasses of wine, 7 Cans of beer per week  . Drug use: No     Allergies   Patient has no known allergies.   Review of Systems Review of Systems  Constitutional: Negative for fever.  Respiratory: Negative for shortness of breath.   Cardiovascular: Positive for chest pain.  Gastrointestinal: Negative for abdominal pain, nausea and vomiting.  Genitourinary: Positive for hematuria. Negative for flank pain.  All other systems reviewed and are negative.    Physical Exam Updated Vital Signs BP (!) 128/55   Pulse 82   Temp 97.7 F (36.5 C) (Oral)   Resp 15   Ht 5\' 10"  (1.778 m)   Wt 72.6 kg (160 lb)   SpO2 100%   BMI 22.96 kg/m   Physical Exam  Constitutional: He is oriented to person, place, and time.  Elderly, nontoxic, anxious appearing  HENT:  Head: Normocephalic and atraumatic.  Cardiovascular: Normal rate, regular rhythm, normal heart sounds and normal pulses.  No murmur heard. Pulmonary/Chest: Effort normal and breath sounds normal. No respiratory distress.  Distant breath sounds, occasional wheeze  Abdominal: Soft. Bowel sounds are normal. There is no tenderness. There is no rebound.  Ileal conduit right lower quadrant, gross blood noted in catheter  bag and tubing, abdomen is otherwise soft  Musculoskeletal: He exhibits no edema.  Neurological: He is alert and oriented to person, place, and time.  Skin: Skin is warm and dry.  Psychiatric:  Anxious  Nursing note and vitals reviewed.    ED Treatments / Results  Labs (all labs ordered are listed, but only abnormal results are displayed) Labs Reviewed  CBC WITH DIFFERENTIAL/PLATELET - Abnormal; Notable for the following components:      Result Value   RBC 2.77 (*)    Hemoglobin 8.3 (*)    HCT 24.9 (*)    RDW 18.0 (*)    Platelets 128 (*)  All other components within normal limits  BASIC METABOLIC PANEL - Abnormal; Notable for the following components:   CO2 14 (*)    Glucose, Bld 102 (*)    BUN 136 (*)    Creatinine, Ser 9.73 (*)    Calcium 8.2 (*)    GFR calc non Af Amer 5 (*)    GFR calc Af Amer 5 (*)    Anion gap 20 (*)    All other components within normal limits  TROPONIN I - Abnormal; Notable for the following components:   Troponin I 0.04 (*)    All other components within normal limits  URINALYSIS, ROUTINE W REFLEX MICROSCOPIC - Abnormal; Notable for the following components:   Color, Urine RED (*)    APPearance BLOODY (*)    Glucose, UA   (*)    Value: TEST NOT REPORTED DUE TO COLOR INTERFERENCE OF URINE PIGMENT   Hgb urine dipstick   (*)    Value: TEST NOT REPORTED DUE TO COLOR INTERFERENCE OF URINE PIGMENT   Bilirubin Urine   (*)    Value: TEST NOT REPORTED DUE TO COLOR INTERFERENCE OF URINE PIGMENT   Ketones, ur   (*)    Value: TEST NOT REPORTED DUE TO COLOR INTERFERENCE OF URINE PIGMENT   Protein, ur   (*)    Value: TEST NOT REPORTED DUE TO COLOR INTERFERENCE OF URINE PIGMENT   Nitrite   (*)    Value: TEST NOT REPORTED DUE TO COLOR INTERFERENCE OF URINE PIGMENT   Leukocytes, UA   (*)    Value: TEST NOT REPORTED DUE TO COLOR INTERFERENCE OF URINE PIGMENT   Bacteria, UA RARE (*)    All other components within normal limits  URINE CULTURE  TYPE AND  SCREEN    EKG  EKG Interpretation  Date/Time:  Wednesday November 16 2017 04:56:56 EST Ventricular Rate:  89 PR Interval:    QRS Duration: 148 QT Interval:  446 QTC Calculation: 543 R Axis:   -59 Text Interpretation:  Sinus rhythm Ventricular premature complex RBBB and LAFB No significant change since last tracing Confirmed by Thayer Jew [phone]) on 11/16/2017 5:00:31 AM       Radiology Dg Chest 2 View  Result Date: 11/16/2017 CLINICAL DATA:  Initial evaluation for acute upper chest pain. EXAM: CHEST  2 VIEW COMPARISON:  Prior radiograph from 11/11/2017. FINDINGS: Mild cardiomegaly, stable. Mediastinal silhouette within normal limits. Extensive aortic atherosclerosis again noted. Extensive peripheral calcifications noted as well. Lungs normally inflated. Mild bibasilar scattered bibasilar scarring and/or fibrosis. No focal infiltrates. No pulmonary edema or pleural effusion. No pneumothorax. No acute osseous abnormality.  Osteopenia noted. IMPRESSION: 1. Stable appearance of the chest with no active cardiopulmonary disease identified. 2. Extensive calcific aortic atherosclerosis. Electronically Signed   By: Jeannine Boga M.D.   On: 11/16/2017 05:25    Procedures Procedures (including critical care time)  Medications Ordered in ED Medications - No data to display   Initial Impression / Assessment and Plan / ED Course  I have reviewed the triage vital signs and the nursing notes.  Pertinent labs & imaging results that were available during my care of the patient were reviewed by me and considered in my medical decision making (see chart for details).     Patient presents with chest pain.  He frequently presents with chest pain and just had a chest pain rule out this past weekend.  Also reports hematuria.  He does have gross blood in his catheter bag.  Abdomen is otherwise soft.  Chest pain is very atypical.  EKG is unchanged.  Troponin, basic lab work obtained.   Regarding his urine, would expect for him to be chronically colonized with E. coli.  He does have a history of Klebsiella UTI.  He is nontoxic and has no signs or symptoms of sepsis.  Will culture urine.  7:36 AM Urinalysis is difficult to interpret because of gross hematuria.  Urine culture is pending.  Creatinine is acutely elevated.  Baseline in the mid eights.  Now 9.7.  Hemoglobin is downtrending to 8.3.  Will obtain a CT scan to evaluate kidneys and assess for any gross abnormalities resulting in hematuria.  Patient will likely need admission.  Troponin is at baseline.  Doubt ACS.  Final Clinical Impressions(s) / ED Diagnoses   Final diagnoses:  Gross hematuria  Atypical chest pain  Acute worsening of stage 4 chronic kidney disease [facility])    ED Discharge Orders    None       Merryl Hacker, MD 11/16/17 [phone]

## 2017-11-16 NOTE — ED Notes (Signed)
ED Provider at bedside. 

## 2017-11-16 NOTE — ED Notes (Signed)
Ordered Renal diet for pt.

## 2017-11-17 DIAGNOSIS — N186 End stage renal disease: Secondary | ICD-10-CM | POA: Diagnosis not present

## 2017-11-17 DIAGNOSIS — E440 Moderate protein-calorie malnutrition: Secondary | ICD-10-CM

## 2017-11-17 LAB — TYPE AND SCREEN
ABO/RH(D): A POS
ANTIBODY SCREEN: NEGATIVE
UNIT DIVISION: 0
Unit division: 0

## 2017-11-17 LAB — CBC
HCT: 30.4 % — ABNORMAL LOW (ref 39.0–52.0)
Hemoglobin: 10.6 g/dL — ABNORMAL LOW (ref 13.0–17.0)
MCH: 30.7 pg (ref 26.0–34.0)
MCHC: 34.9 g/dL (ref 30.0–36.0)
MCV: 88.1 fL (ref 78.0–100.0)
Platelets: 105 10*3/uL — ABNORMAL LOW (ref 150–400)
RBC: 3.45 MIL/uL — ABNORMAL LOW (ref 4.22–5.81)
RDW: 17.4 % — AB (ref 11.5–15.5)
WBC: 8.5 10*3/uL (ref 4.0–10.5)

## 2017-11-17 LAB — TROPONIN I: TROPONIN I: 0.06 ng/mL — AB (ref <0.03)

## 2017-11-17 LAB — BASIC METABOLIC PANEL
Anion gap: 18 — ABNORMAL HIGH (ref 5–15)
BUN: 144 mg/dL — AB (ref 6–20)
CALCIUM: 7.5 mg/dL — AB (ref 8.9–10.3)
CO2: 12 mmol/L — AB (ref 22–32)
Chloride: 106 mmol/L (ref 101–111)
Creatinine, Ser: 8.75 mg/dL — ABNORMAL HIGH (ref 0.61–1.24)
GFR calc Af Amer: 6 mL/min — ABNORMAL LOW (ref >60)
GFR, EST NON AFRICAN AMERICAN: 5 mL/min — AB (ref >60)
GLUCOSE: 100 mg/dL — AB (ref 65–99)
Potassium: 4.9 mmol/L (ref 3.5–5.1)
Sodium: 136 mmol/L (ref 135–145)

## 2017-11-17 LAB — BPAM RBC
BLOOD PRODUCT EXPIRATION DATE: 201901252359
Blood Product Expiration Date: 201902072359
ISSUE DATE / TIME: 201901231033
ISSUE DATE / TIME: 201901231441
UNIT TYPE AND RH: 600
Unit Type and Rh: 6200

## 2017-11-17 LAB — URINE CULTURE: Culture: <10000 — AB

## 2017-11-17 NOTE — Discharge Summary (Signed)
Physician Discharge Summary  Matthew Mays PPI:[id number] DOB: 06/02/1941 DOA: 11/16/2017  PCP: Clovia Cuff, MD  Admit date: 11/16/2017 Discharge date: 11/17/2017  Admitted From: Home  Disposition:  Home   Recommendations for Outpatient Follow-up:  1. Follow up with PCP in 1-2 weeks 2. Please obtain BMP/CBC in one week  Home Health: No  Equipment/Devices: ileal conduit with foley in place  Discharge Condition: stable CODE STATUS: DNAR  Diet recommendation: Renal  Brief/Interim Summary:  ##) Atypical CP: Pt was admitted with atypical CP. EKG was negative. Troponins were chronically elevated and unchanged. While Pt might benefit from cardiac evaluation, his chronic hematuria, CKD with refusal to go on dialysis and goals of care align with a minimal evaluation approach.   ##) Anemia: Unclear etiology with years to loopgrams and further evaluation. 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  He also has had years of intermittent hematuria from the ileal conduit and has had extensive evaluation with urology.  At this time as he is refusing dialysis for stage V chronic kidney disease he would like to have this anemia managed without recurrent admissions for transfusion.  This Probation officer discussed with his PCP about possibly needing outpatient labs weekly and intermittent transfusions as an outpatient at a transfusion center.  His PCP was agreeable to discussing this option with the patient.  ##) Hematuria: Per above this is been extensively evaluated by urology with multiple loop grams CTs of the abdomen with no clear evidence of source of recurrent hematuria.  The intermittent nature of his hematuria suggests that at least some component of this must be secondary to a vessel from the loop ileal conduit.  ##) Stage V CKD: Patient is a long history of progressive chronic kidney disease.  Currently he is at stage V.  He is refused multiple offers to start dialysis and  has been on dialysis prior and has 2 grafts that are nonfunctioning in his right upper extremity.  Patient understands that if he does not accept dialysis he would likely die from his uremia.  He is accepting of this.  He will pursue hospice options with his outpatient doctor.  ##) Possible UTI: Patient was initially placed on antibiotics.  Urine culture came back less than 10,000 colonies.  Antibiotics were discontinued patient was not sent home on any antibiotics.  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  Discharge Diagnoses:  Active Problems:   End stage renal disease (HCC)   Elevated troponin   Atypical chest pain   Hematuria, gross    Discharge Instructions   Allergies as of 11/17/2017   No Known Allergies     Medication List    STOP taking these medications   cephALEXin 500 MG capsule Commonly known as:  KEFLEX     TAKE these medications   calcium carbonate 750 MG chewable tablet Commonly known as:  TUMS EX Chew 3 tablets by mouth 2 (two) times daily. LUNCH and SUPPER   dorzolamide 2 % ophthalmic solution Commonly known as:  TRUSOPT Place 1 drop into the right eye 2 (two) times daily.   furosemide 80 MG tablet Commonly known as:  LASIX Take 80 mg by mouth daily.   IRON SUPPLEMENT 325 (65 FE) MG tablet Generic drug:  ferrous sulfate Take 325 mg by mouth 2 (two) times daily with a meal. LUNCH and SUPPER   isosorbide mononitrate 30 MG 24 hr tablet Commonly known as:  IMDUR Take 3  tablets (90 mg total) by mouth daily.   latanoprost 0.005 % ophthalmic solution Commonly known as:  XALATAN Place 1 drop into both eyes daily. 5 PM   metoprolol tartrate 100 MG tablet Commonly known as:  LOPRESSOR Take 100 mg by mouth 2 (two) times daily. LUNCH and BEDTIME   sodium bicarbonate 650 MG tablet Take 1 tablet (650 mg total) by mouth 2 (two) times daily.       No Known Allergies  Procedures/Studies: Ct Abdomen Pelvis Wo  Contrast  Result Date: 11/16/2017 CLINICAL DATA:  Recent kidney infection. Gross hematuria. History of prostate cancer. EXAM: CT ABDOMEN AND PELVIS WITHOUT CONTRAST TECHNIQUE: Multidetector CT imaging of the abdomen and pelvis was performed following the standard protocol without IV contrast. COMPARISON:  CT 07/21/2012 FINDINGS: Lower chest: No acute abnormality. Dense coronary artery and valvular calcifications. Calcifications within the left ventricle likely involving the papillary muscles and may be related to prior infarct. No effusions. Hepatobiliary: No focal hepatic abnormality. Gallbladder unremarkable. Pancreas: No focal abnormality or ductal dilatation. Spleen: No focal abnormality.  Normal size. Adrenals/Urinary Tract: Bilateral renal atrophy and cortical thinning. Numerous low-density lesions within the kidneys bilaterally, likely small cysts. High-density material noted within the right renal collecting system, pelvis and ureter, likely hematuria. No hydronephrosis. Right lower quadrant ileal conduit noted. Prior cystectomy. Adrenal glands unremarkable. Stomach/Bowel: Sigmoid and descending colonic diverticulosis. No active diverticulitis. Stomach and small bowel decompressed, unremarkable. Vascular/Lymphatic: Diffuse aortic, iliac, and branch vessel calcifications. No adenopathy. Reproductive: No visible focal abnormality. Other: No free fluid or free air. Musculoskeletal: No acute bony abnormality. IMPRESSION: Right lower quadrant ileal conduit noted. There is no hydronephrosis. High-density urine noted within the right renal collecting system and renal pelvis as well as the right ureter compatible with hematuria. Exact cause not visualized. No visible stones. Extensive renovascular calcifications. Sigmoid diverticulosis.  No active diverticulitis. Severe arterial atherosclerosis. Electronically Signed   By: Rolm Baptise M.D.   On: 11/16/2017 09:26   Dg Chest 2 View  Result Date:  11/16/2017 CLINICAL DATA:  Initial evaluation for acute upper chest pain. EXAM: CHEST  2 VIEW COMPARISON:  Prior radiograph from 11/11/2017. FINDINGS: Mild cardiomegaly, stable. Mediastinal silhouette within normal limits. Extensive aortic atherosclerosis again noted. Extensive peripheral calcifications noted as well. Lungs normally inflated. Mild bibasilar scattered bibasilar scarring and/or fibrosis. No focal infiltrates. No pulmonary edema or pleural effusion. No pneumothorax. No acute osseous abnormality.  Osteopenia noted. IMPRESSION: 1. Stable appearance of the chest with no active cardiopulmonary disease identified. 2. Extensive calcific aortic atherosclerosis. Electronically Signed   By: Jeannine Boga M.D.   On: 11/16/2017 05:25   Dg Chest 2 View  Result Date: 11/11/2017 CLINICAL DATA:  Chest pain EXAM: CHEST  2 VIEW COMPARISON:  November 04, 2017 FINDINGS: There is slight scarring in the lung bases. There is no edema or consolidation. The heart size and pulmonary vascularity are normal. No adenopathy. There is aortic atherosclerosis. There is extensive brachial and axillary artery calcification bilaterally. There is degenerative change in the thoracic spine. IMPRESSION: Extensive aortic atherosclerosis. Peripheral arterial vascular calcification bilaterally also noted. No edema or consolidation.  Mild bibasilar scarring. Aortic Atherosclerosis (ICD10-I70.0). Electronically Signed   By: Lowella Grip III M.D.   On: 11/11/2017 11:32   Dg Chest 2 View  Result Date: 11/04/2017 CLINICAL DATA:  Chest pain. EXAM: CHEST  2 VIEW COMPARISON:  And 10/21/2017. FINDINGS: Mediastinum and hilar structures normal. Mild cardiomegaly with normal pulmonary vascularity. No focal infiltrate. Mild left base subsegmental  atelectasis. Vascular calcification noted. IMPRESSION: Low lung volumes with mild basilar atelectasis. No acute cardiopulmonary disease. Electronically Signed   By: Marcello Moores  Register   On:  11/04/2017 13:22   Dg Chest 2 View  Result Date: 10/21/2017 CLINICAL DATA:  77 y/o  M; chest pain and shortness of breath. EXAM: CHEST  2 VIEW COMPARISON:  09/20/2017 chest radiograph FINDINGS: Normal cardiac silhouette. Aortic atherosclerosis with calcification. No focal consolidation. No pleural effusion or pneumothorax. No acute osseous abnormality is evident. IMPRESSION: No acute pulmonary process identified. Electronically Signed   By: Kristine Garbe M.D.   On: 10/21/2017 05:55       Subjective:   Discharge Exam: Vitals:   11/17/17 0428 11/17/17 0807  BP: (!) 122/45 (!) 152/46  Pulse: 78 82  Resp: 15 16  Temp: 97.9 F (36.6 C) (!) 97.5 F (36.4 C)  SpO2: 100% 100%   Vitals:   11/16/17 1819 11/16/17 2007 11/17/17 0428 11/17/17 0807  BP: (!) 149/65 (!) 115/52 (!) 122/45 (!) 152/46  Pulse: 88 91 78 82  Resp: 18 16 15 16   Temp: 97.6 F (36.4 C) 97.7 F (36.5 C) 97.9 F (36.6 C) (!) 97.5 F (36.4 C)  TempSrc: Oral   Oral  SpO2: 100% 100% 100% 100%  Weight:  74 kg (163 lb 2.3 oz)    Height:        General: Pt is alert, awake, not in acute distress, Pt smells of urea Cardiovascular: RRR, S1/S2 +, no rubs, no gallops Respiratory: CTA bilaterally, no wheezing, no rhonchi Abdominal: Soft, NT, ND, bowel sounds +, ileal conduit site is c/d/i Extremities: no edema, no cyanosis    The results of significant diagnostics from this hospitalization (including imaging, microbiology, ancillary and laboratory) are listed below for reference.     Microbiology: Recent Results (from the past 240 hour(s))  Urine culture     Status: Abnormal   Collection Time: 11/11/17  2:13 PM  Result Value Ref Range Status   Specimen Description URINE, CLEAN CATCH  Final   Special Requests NONE  Final   Culture MULTIPLE SPECIES PRESENT, SUGGEST RECOLLECTION (A)  Final   Report Status 11/12/2017 FINAL  Final  Culture, blood (routine x 2)     Status: None   Collection Time: 11/11/17   9:11 PM  Result Value Ref Range Status   Specimen Description BLOOD RIGHT ANTECUBITAL  Final   Special Requests IN PEDIATRIC BOTTLE Blood Culture adequate volume  Final   Culture NO GROWTH 5 DAYS  Final   Report Status 11/16/2017 FINAL  Final  Culture, blood (routine x 2)     Status: None   Collection Time: 11/11/17  9:22 PM  Result Value Ref Range Status   Specimen Description BLOOD RIGHT HAND  Final   Special Requests IN PEDIATRIC BOTTLE Blood Culture adequate volume  Final   Culture NO GROWTH 5 DAYS  Final   Report Status 11/16/2017 FINAL  Final  Urine culture     Status: Abnormal   Collection Time: 11/16/17  5:38 AM  Result Value Ref Range Status   Specimen Description URINE, CATHETERIZED  Final   Special Requests NONE  Final   Culture <10,000 COLONIES/mL INSIGNIFICANT GROWTH (A)  Final   Report Status 11/17/2017 FINAL  Final     Labs: BNP (last 3 results) Recent Labs    03/16/17 0259 07/21/17 1416  BNP 680.7* 409.8*   Basic Metabolic Panel: Recent Labs  Lab 11/11/17 1109 11/11/17 2107 11/12/17 0245 11/16/17  3976 11/17/17 0647  NA 135  --  135 135 136  K 4.9  --  4.7 5.1 4.9  CL 101  --  103 101 106  CO2 14*  --  14* 14* 12*  GLUCOSE 113*  --  117* 102* 100*  BUN 139*  --  147* 136* 144*  CREATININE 8.40* 8.61* 8.60* 9.73* 8.75*  CALCIUM 8.4*  --  7.6* 8.2* 7.5*   Liver Function Tests: No results for input(s): AST, ALT, ALKPHOS, BILITOT, PROT, ALBUMIN in the last 168 hours. No results for input(s): LIPASE, AMYLASE in the last 168 hours. No results for input(s): AMMONIA in the last 168 hours. CBC: Recent Labs  Lab 11/11/17 1109 11/11/17 2107 11/12/17 0245 11/16/17 0521 11/17/17 0647  WBC 8.8 8.1 7.6 7.5 8.5  NEUTROABS  --   --   --  6.0  --   HGB 10.4* 10.2* 9.3* 8.3* 10.6*  HCT 31.1* 30.4* 27.9* 24.9* 30.4*  MCV 89.6 91.0 89.7 89.9 88.1  PLT 136* 136* 137* 128* 105*   Cardiac Enzymes: Recent Labs  Lab 11/12/17 0918 11/16/17 0521  11/16/17 1118 11/16/17 1944 11/16/17 2259  TROPONINI 0.06* 0.04* 0.03* 0.05* 0.06*   BNP: Invalid input(s): POCBNP CBG: No results for input(s): GLUCAP in the last 168 hours. D-Dimer No results for input(s): DDIMER in the last 72 hours. Hgb A1c No results for input(s): HGBA1C in the last 72 hours. Lipid Profile No results for input(s): CHOL, HDL, LDLCALC, TRIG, CHOLHDL, LDLDIRECT in the last 72 hours. Thyroid function studies No results for input(s): TSH, T4TOTAL, T3FREE, THYROIDAB in the last 72 hours.  Invalid input(s): FREET3 Anemia work up No results for input(s): VITAMINB12, FOLATE, FERRITIN, TIBC, IRON, RETICCTPCT in the last 72 hours. Urinalysis    Component Value Date/Time   COLORURINE RED (A) 11/16/2017 0545   APPEARANCEUR BLOODY (A) 11/16/2017 0545   LABSPEC  11/16/2017 0545    TEST NOT REPORTED DUE TO COLOR INTERFERENCE OF URINE PIGMENT   PHURINE  11/16/2017 0545    TEST NOT REPORTED DUE TO COLOR INTERFERENCE OF URINE PIGMENT   GLUCOSEU (A) 11/16/2017 0545    TEST NOT REPORTED DUE TO COLOR INTERFERENCE OF URINE PIGMENT   HGBUR (A) 11/16/2017 0545    TEST NOT REPORTED DUE TO COLOR INTERFERENCE OF URINE PIGMENT   BILIRUBINUR (A) 11/16/2017 0545    TEST NOT REPORTED DUE TO COLOR INTERFERENCE OF URINE PIGMENT   KETONESUR (A) 11/16/2017 0545    TEST NOT REPORTED DUE TO COLOR INTERFERENCE OF URINE PIGMENT   PROTEINUR (A) 11/16/2017 0545    TEST NOT REPORTED DUE TO COLOR INTERFERENCE OF URINE PIGMENT   NITRITE (A) 11/16/2017 0545    TEST NOT REPORTED DUE TO COLOR INTERFERENCE OF URINE PIGMENT   LEUKOCYTESUR (A) 11/16/2017 0545    TEST NOT REPORTED DUE TO COLOR INTERFERENCE OF URINE PIGMENT   Sepsis Labs Invalid input(s): PROCALCITONIN,  WBC,  LACTICIDVEN Microbiology Recent Results (from the past 240 hour(s))  Urine culture     Status: Abnormal   Collection Time: 11/11/17  2:13 PM  Result Value Ref Range Status   Specimen Description URINE, CLEAN CATCH  Final    Special Requests NONE  Final   Culture MULTIPLE SPECIES PRESENT, SUGGEST RECOLLECTION (A)  Final   Report Status 11/12/2017 FINAL  Final  Culture, blood (routine x 2)     Status: None   Collection Time: 11/11/17  9:11 PM  Result Value Ref Range Status   Specimen Description  BLOOD RIGHT ANTECUBITAL  Final   Special Requests IN PEDIATRIC BOTTLE Blood Culture adequate volume  Final   Culture NO GROWTH 5 DAYS  Final   Report Status 11/16/2017 FINAL  Final  Culture, blood (routine x 2)     Status: None   Collection Time: 11/11/17  9:22 PM  Result Value Ref Range Status   Specimen Description BLOOD RIGHT HAND  Final   Special Requests IN PEDIATRIC BOTTLE Blood Culture adequate volume  Final   Culture NO GROWTH 5 DAYS  Final   Report Status 11/16/2017 FINAL  Final  Urine culture     Status: Abnormal   Collection Time: 11/16/17  5:38 AM  Result Value Ref Range Status   Specimen Description URINE, CATHETERIZED  Final   Special Requests NONE  Final   Culture <10,000 COLONIES/mL INSIGNIFICANT GROWTH (A)  Final   Report Status 11/17/2017 FINAL  Final     Time coordinating discharge: Over 30 minutes  SIGNED:   Cristy Folks, MD  [facility] 11/17/2017, 2:25 PM   If 7PM-7AM, please contact night-coverage [email or web address] Password [email or web address]

## 2017-11-17 NOTE — Care Management Obs Status (Signed)
[facility] NOTIFICATION   Patient Details  Name: Matthew Mays MRN: [id number] Date of Birth: 04-27-1941   Medicare Observation Status Notification Given:  Yes    Carles Collet, RN 11/17/2017, 10:15 AM

## 2017-11-17 NOTE — Progress Notes (Signed)
Patient refused to have SCD on his bilateral lower extremity.

## 2017-11-17 NOTE — Discharge Instructions (Signed)
Anemia Anemia is a condition in which you do not have enough red blood cells or hemoglobin. Hemoglobin is a substance in red blood cells that carries oxygen. When you do not have enough red blood cells or hemoglobin (are anemic), your body cannot get enough oxygen and your organs may not work properly. As a result, you may feel very tired or have other problems. What are the causes? Common causes of anemia include:  Excessive bleeding. Anemia can be caused by excessive bleeding inside or outside the body, including bleeding from the intestine or from periods in women.  Poor nutrition.  Long-lasting (chronic) kidney, thyroid, and liver disease.  Bone marrow disorders.  Cancer and treatments for cancer.  HIV (human immunodeficiency virus) and AIDS (acquired immunodeficiency syndrome).  Treatments for HIV and AIDS.  Spleen problems.  Blood disorders.  Infections, medicines, and autoimmune disorders that destroy red blood cells.  What are the signs or symptoms? Symptoms of this condition include:  Minor weakness.  Dizziness.  Headache.  Feeling heartbeats that are irregular or faster than normal (palpitations).  Shortness of breath, especially with exercise.  Paleness.  Cold sensitivity.  Indigestion.  Nausea.  Difficulty sleeping.  Difficulty concentrating.  Symptoms may occur suddenly or develop slowly. If your anemia is mild, you may not have symptoms. How is this diagnosed? This condition is diagnosed based on:  Blood tests.  Your medical history.  A physical exam.  Bone marrow biopsy.  Your health care provider may also check your stool (feces) for blood and may do additional testing to look for the cause of your bleeding. You may also have other tests, including:  Imaging tests, such as a CT scan or MRI.  Endoscopy.  Colonoscopy.  How is this treated? Treatment for this condition depends on the cause. If you continue to lose a lot of blood,  you may need to be treated at a hospital. Treatment may include:  Taking supplements of iron, vitamin T02, or folic acid.  Taking a hormone medicine (erythropoietin) that can help to stimulate red blood cell growth.  Having a blood transfusion. This may be needed if you lose a lot of blood.  Making changes to your diet.  Having surgery to remove your spleen.  Follow these instructions at home:  Take over-the-counter and prescription medicines only as told by your health care provider.  Take supplements only as told by your health care provider.  Follow any diet instructions that you were given.  Keep all follow-up visits as told by your health care provider. This is important. Contact a health care provider if:  You develop new bleeding anywhere in the body. Get help right away if:  You are very weak.  You are short of breath.  You have pain in your abdomen or chest.  You are dizzy or feel faint.  You have trouble concentrating.  You have bloody or black, tarry stools.  You vomit repeatedly or you vomit up blood. Summary  Anemia is a condition in which you do not have enough red blood cells or enough of a substance in your red blood cells that carries oxygen (hemoglobin).  Symptoms may occur suddenly or develop slowly.  If your anemia is mild, you may not have symptoms.  This condition is diagnosed with blood tests as well as a medical history and physical exam. Other tests may be needed.  Treatment for this condition depends on the cause of the anemia. This information is not intended to replace advice  given to you by your health care provider. Make sure you discuss any questions you have with your health care provider. °Document Released: 11/18/2004 Document Revised: 11/12/2016 Document Reviewed: 11/12/2016 °Elsevier Interactive Patient Education © 2018 [employer] ° °

## 2017-11-17 NOTE — Progress Notes (Signed)
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  GOAL:   Patient will meet greater than or equal to 90% of their needs  MONITOR:   PO intake, Labs, Weight trends, Supplement acceptance  REASON FOR ASSESSMENT:   Malnutrition Screening Tool    ASSESSMENT:   77 yo male admitted with gross hematuria. 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 Pt reports he typically eats 2 meals per day with snacks plus 1 Ensure Plus daily. Pt reports he lives home alone, orders his groceries online and has a friend who picks up his groceries and puts them away. Pt reports his 2 meals per day are usually a frozen meal like [employer]. Pt does not eat Breakfast, usually drink his Ensure Plus in the AM. Pt snacks on foods like crackers and ice cream bars Pt reports he has chronic diarrhea, pt attributes this to his kidneys.  Pt reports he thinks maybe he hast lost 3-4 pounds in the past few weeks.  Per weight encounters, pt weighs 163 pounds at present (euvolemic per MD note at present time). Pt weighed 159 pounds earlier this month, 165 pounds in December, 166 pounds in November, 168 pounds in September. Recent weight appears to relatively stable but pt with wt of 190 pounds in August of 2017 indicating some wt loss in the past.   UOP 1250 mL per I/O flow sheet   Labs: BUN 144, Creatinine 8.75, potassium wdl Meds: lasix  NUTRITION - FOCUSED PHYSICAL EXAM:    Most Recent Value  Orbital  Region  Mild depletion  Upper Arm Region  Mild depletion  Thoracic and Lumbar Region  No depletion  Buccal Region  No depletion  Temple Region  Mild depletion  Clavicle Bone Region  Mild depletion  Clavicle and Acromion Bone Region  Mild depletion  Scapular Bone Region  Mild depletion  Dorsal Hand  Moderate depletion  Patellar Region  No depletion  Anterior Thigh Region  Unable to assess  Posterior Calf Region  No depletion  Edema (RD Assessment)  None       Diet Order:  Diet renal with fluid restriction Fluid restriction: 1200 mL Fluid; Room service appropriate? Yes; Fluid consistency: Thin Diet - low sodium heart healthy  EDUCATION NEEDS:   Education needs have been addressed  Skin:  Skin Assessment: Skin Integrity Issues: Skin Integrity Issues:: Stage I Stage I: sacrum  Last BM:  1/23  Height:   Ht Readings from Last 1 Encounters:  11/16/17 5\' 10"  (1.778 m)    Weight:   Wt Readings from Last 1 Encounters:  11/16/17 163 lb 2.3 oz (74 kg)    Ideal Body Weight:     BMI:  Body mass index is 23.41 kg/m.  Estimated Nutritional Needs:   Kcal:  2000-2200 kcals  Protein:  90-98 g  Fluid:  1500 mL   [place] Passey MS, RD, LDN, CNSC [phone] Pager  [phone] Weekend/On-Call Pager

## 2017-11-21 ENCOUNTER — Emergency Department (HOSPITAL_COMMUNITY): Payer: Medicare Other

## 2017-11-21 ENCOUNTER — Emergency Department (HOSPITAL_COMMUNITY)
Admission: EM | Admit: 2017-11-21 | Discharge: 2017-11-21 | Disposition: A | Discharge status: Home / Self Care | Payer: Medicare Other | Source: Home / Self Care | Attending: Emergency Medicine | Admitting: Emergency Medicine

## 2017-11-21 ENCOUNTER — Encounter (HOSPITAL_COMMUNITY): Payer: Self-pay

## 2017-11-21 ENCOUNTER — Inpatient Hospital Stay (HOSPITAL_COMMUNITY)
Admission: EM | Admit: 2017-11-21 | Discharge: 2017-11-24 | DRG: 280 | Disposition: A | Discharge status: Hospice | Payer: Medicare Other | Attending: Internal Medicine | Admitting: Internal Medicine

## 2017-11-21 ENCOUNTER — Other Ambulatory Visit: Payer: Self-pay

## 2017-11-21 DIAGNOSIS — R079 Chest pain, unspecified: Secondary | ICD-10-CM | POA: Diagnosis not present

## 2017-11-21 DIAGNOSIS — Z906 Acquired absence of other parts of urinary tract: Secondary | ICD-10-CM

## 2017-11-21 DIAGNOSIS — I959 Hypotension, unspecified: Secondary | ICD-10-CM | POA: Diagnosis present

## 2017-11-21 DIAGNOSIS — R319 Hematuria, unspecified: Secondary | ICD-10-CM | POA: Diagnosis not present

## 2017-11-21 DIAGNOSIS — M545 Low back pain: Secondary | ICD-10-CM | POA: Diagnosis present

## 2017-11-21 DIAGNOSIS — R531 Weakness: Secondary | ICD-10-CM | POA: Diagnosis not present

## 2017-11-21 DIAGNOSIS — Z87891 Personal history of nicotine dependence: Secondary | ICD-10-CM | POA: Insufficient documentation

## 2017-11-21 DIAGNOSIS — Z8551 Personal history of malignant neoplasm of bladder: Secondary | ICD-10-CM | POA: Insufficient documentation

## 2017-11-21 DIAGNOSIS — Z79899 Other long term (current) drug therapy: Secondary | ICD-10-CM | POA: Insufficient documentation

## 2017-11-21 DIAGNOSIS — D72829 Elevated white blood cell count, unspecified: Secondary | ICD-10-CM | POA: Diagnosis present

## 2017-11-21 DIAGNOSIS — I214 Non-ST elevation (NSTEMI) myocardial infarction: Secondary | ICD-10-CM | POA: Diagnosis not present

## 2017-11-21 DIAGNOSIS — R739 Hyperglycemia, unspecified: Secondary | ICD-10-CM | POA: Diagnosis not present

## 2017-11-21 DIAGNOSIS — I120 Hypertensive chronic kidney disease with stage 5 chronic kidney disease or end stage renal disease: Secondary | ICD-10-CM | POA: Insufficient documentation

## 2017-11-21 DIAGNOSIS — N2581 Secondary hyperparathyroidism of renal origin: Secondary | ICD-10-CM | POA: Diagnosis present

## 2017-11-21 DIAGNOSIS — R310 Gross hematuria: Secondary | ICD-10-CM | POA: Insufficient documentation

## 2017-11-21 DIAGNOSIS — N186 End stage renal disease: Secondary | ICD-10-CM | POA: Diagnosis not present

## 2017-11-21 DIAGNOSIS — I252 Old myocardial infarction: Secondary | ICD-10-CM

## 2017-11-21 DIAGNOSIS — D631 Anemia in chronic kidney disease: Secondary | ICD-10-CM | POA: Diagnosis present

## 2017-11-21 DIAGNOSIS — N189 Chronic kidney disease, unspecified: Secondary | ICD-10-CM | POA: Diagnosis not present

## 2017-11-21 DIAGNOSIS — D500 Iron deficiency anemia secondary to blood loss (chronic): Secondary | ICD-10-CM | POA: Diagnosis present

## 2017-11-21 DIAGNOSIS — Z66 Do not resuscitate: Secondary | ICD-10-CM | POA: Diagnosis present

## 2017-11-21 DIAGNOSIS — I129 Hypertensive chronic kidney disease with stage 1 through stage 4 chronic kidney disease, or unspecified chronic kidney disease: Secondary | ICD-10-CM | POA: Diagnosis not present

## 2017-11-21 DIAGNOSIS — Z9079 Acquired absence of other genital organ(s): Secondary | ICD-10-CM

## 2017-11-21 DIAGNOSIS — D6959 Other secondary thrombocytopenia: Secondary | ICD-10-CM | POA: Diagnosis not present

## 2017-11-21 DIAGNOSIS — Z936 Other artificial openings of urinary tract status: Secondary | ICD-10-CM

## 2017-11-21 DIAGNOSIS — I080 Rheumatic disorders of both mitral and aortic valves: Secondary | ICD-10-CM | POA: Diagnosis present

## 2017-11-21 DIAGNOSIS — E872 Acidosis: Secondary | ICD-10-CM | POA: Diagnosis present

## 2017-11-21 DIAGNOSIS — Z515 Encounter for palliative care: Secondary | ICD-10-CM | POA: Diagnosis not present

## 2017-11-21 DIAGNOSIS — G8929 Other chronic pain: Secondary | ICD-10-CM | POA: Diagnosis present

## 2017-11-21 DIAGNOSIS — R000 Tachycardia, unspecified: Secondary | ICD-10-CM | POA: Diagnosis not present

## 2017-11-21 DIAGNOSIS — Z808 Family history of malignant neoplasm of other organs or systems: Secondary | ICD-10-CM

## 2017-11-21 DIAGNOSIS — R0789 Other chest pain: Secondary | ICD-10-CM | POA: Diagnosis not present

## 2017-11-21 HISTORY — DX: Angina pectoris, unspecified: I20.9

## 2017-11-21 LAB — CBC WITH DIFFERENTIAL/PLATELET
BASOS ABS: 0 10*3/uL (ref 0.0–0.1)
Basophils Absolute: 0 10*3/uL (ref 0.0–0.1)
Basophils Relative: 1 %
Basophils Relative: 0 %
EOS ABS: 0.1 10*3/uL (ref 0.0–0.7)
EOS PCT: 3 %
EOS PCT: 1 %
Eosinophils Absolute: 0.1 10*3/uL (ref 0.0–0.7)
HCT: 50.8 % (ref 39.0–52.0)
HEMATOCRIT: 28.1 % — AB (ref 39.0–52.0)
Hemoglobin: 17.2 g/dL — ABNORMAL HIGH (ref 13.0–17.0)
Hemoglobin: 9.4 g/dL — ABNORMAL LOW (ref 13.0–17.0)
LYMPHS PCT: 16 %
Lymphocytes Relative: 10 %
Lymphs Abs: 0.6 10*3/uL — ABNORMAL LOW (ref 0.7–4.0)
Lymphs Abs: 1.1 10*3/uL (ref 0.7–4.0)
MCH: 30.7 pg (ref 26.0–34.0)
MCH: 30.8 pg (ref 26.0–34.0)
MCHC: 33.9 g/dL (ref 30.0–36.0)
MCHC: 33.5 g/dL (ref 30.0–36.0)
MCV: 90.7 fL (ref 78.0–100.0)
MCV: 92.1 fL (ref 78.0–100.0)
MONO ABS: 0.4 10*3/uL (ref 0.1–1.0)
MONOS PCT: 4 %
Monocytes Absolute: 0.2 10*3/uL (ref 0.1–1.0)
Monocytes Relative: 4 %
NEUTROS ABS: 9.4 10*3/uL — AB (ref 1.7–7.7)
Neutro Abs: 2.8 10*3/uL (ref 1.7–7.7)
Neutrophils Relative %: 76 %
Neutrophils Relative %: 85 %
PLATELETS: 67 10*3/uL — AB (ref 150–400)
Platelets: 173 10*3/uL (ref 150–400)
RBC: 5.6 MIL/uL (ref 4.22–5.81)
RBC: 3.05 MIL/uL — ABNORMAL LOW (ref 4.22–5.81)
RDW: 17.6 % — ABNORMAL HIGH (ref 11.5–15.5)
RDW: 17.7 % — AB (ref 11.5–15.5)
WBC: 3.7 10*3/uL — AB (ref 4.0–10.5)
WBC: 11 10*3/uL — ABNORMAL HIGH (ref 4.0–10.5)

## 2017-11-21 LAB — CBC
HCT: 26.4 % — ABNORMAL LOW (ref 39.0–52.0)
Hemoglobin: 8.8 g/dL — ABNORMAL LOW (ref 13.0–17.0)
MCH: 30.8 pg (ref 26.0–34.0)
MCHC: 33.3 g/dL (ref 30.0–36.0)
MCV: 92.3 fL (ref 78.0–100.0)
Platelets: 177 10*3/uL (ref 150–400)
RBC: 2.86 MIL/uL — ABNORMAL LOW (ref 4.22–5.81)
RDW: 17.6 % — ABNORMAL HIGH (ref 11.5–15.5)
WBC: 10.7 10*3/uL — ABNORMAL HIGH (ref 4.0–10.5)

## 2017-11-21 LAB — URINALYSIS, ROUTINE W REFLEX MICROSCOPIC
Bacteria, UA: NONE SEEN
SQUAMOUS EPITHELIAL / LPF: NONE SEEN

## 2017-11-21 LAB — I-STAT TROPONIN, ED: Troponin i, poc: 0.34 ng/mL (ref 0.00–0.08)

## 2017-11-21 LAB — TROPONIN I
TROPONIN I: 0.06 ng/mL — AB (ref <0.03)
TROPONIN I: 1.83 ng/mL — AB (ref <0.03)

## 2017-11-21 LAB — BASIC METABOLIC PANEL
ANION GAP: 22 — AB (ref 5–15)
Anion gap: 22 — ABNORMAL HIGH (ref 5–15)
BUN: 147 mg/dL — ABNORMAL HIGH (ref 6–20)
BUN: 153 mg/dL — ABNORMAL HIGH (ref 6–20)
CO2: 14 mmol/L — ABNORMAL LOW (ref 22–32)
CO2: 13 mmol/L — ABNORMAL LOW (ref 22–32)
Calcium: 8.2 mg/dL — ABNORMAL LOW (ref 8.9–10.3)
Calcium: 7.9 mg/dL — ABNORMAL LOW (ref 8.9–10.3)
Chloride: 102 mmol/L (ref 101–111)
Chloride: 99 mmol/L — ABNORMAL LOW (ref 101–111)
Creatinine, Ser: 9 mg/dL — ABNORMAL HIGH (ref 0.61–1.24)
Creatinine, Ser: 8.93 mg/dL — ABNORMAL HIGH (ref 0.61–1.24)
GFR calc Af Amer: 6 mL/min — ABNORMAL LOW (ref >60)
GFR calc Af Amer: 6 mL/min — ABNORMAL LOW (ref >60)
GFR calc non Af Amer: 5 mL/min — ABNORMAL LOW (ref >60)
GFR, EST NON AFRICAN AMERICAN: 5 mL/min — AB (ref >60)
Glucose, Bld: 93 mg/dL (ref 65–99)
Glucose, Bld: 271 mg/dL — ABNORMAL HIGH (ref 65–99)
POTASSIUM: 5.2 mmol/L — AB (ref 3.5–5.1)
Potassium: 5.3 mmol/L — ABNORMAL HIGH (ref 3.5–5.1)
SODIUM: 138 mmol/L (ref 135–145)
Sodium: 134 mmol/L — ABNORMAL LOW (ref 135–145)

## 2017-11-21 LAB — DIFFERENTIAL
Basophils Absolute: 0 10*3/uL (ref 0.0–0.1)
Basophils Relative: 0 %
Eosinophils Absolute: 0.1 10*3/uL (ref 0.0–0.7)
Eosinophils Relative: 0 %
Lymphocytes Relative: 7 %
Lymphs Abs: 0.7 10*3/uL (ref 0.7–4.0)
Monocytes Absolute: 0.4 10*3/uL (ref 0.1–1.0)
Monocytes Relative: 3 %
Neutro Abs: 10.2 10*3/uL — ABNORMAL HIGH (ref 1.7–7.7)
Neutrophils Relative %: 90 %

## 2017-11-21 LAB — PROTIME-INR
INR: 1.04
INR: 0.95
PROTHROMBIN TIME: 12.6 s (ref 11.4–15.2)
Prothrombin Time: 13.5 seconds (ref 11.4–15.2)

## 2017-11-21 LAB — APTT
aPTT: 29 seconds (ref 24–36)
aPTT: 26 seconds (ref 24–36)

## 2017-11-21 MED ORDER — ACETAMINOPHEN 325 MG PO TABS: 650.0000 mg | ORAL_TABLET | ORAL | Status: DC | PRN

## 2017-11-21 MED ORDER — ASPIRIN 81 MG PO CHEW
324.0000 mg | CHEWABLE_TABLET | Freq: Every day | ORAL | Status: DC
  Administered 2017-11-22 – 2017-11-23 (×2): 324 mg via ORAL
  Filled 2017-11-21 (×3): qty 4

## 2017-11-21 MED ORDER — CALCIUM CARBONATE 1250 (500 CA) MG PO TABS
3.0000 | ORAL_TABLET | Freq: Two times a day (BID) | ORAL | Status: DC
  Filled 2017-11-21: qty 3
  Filled 2017-11-21: qty 2
  Filled 2017-11-21: qty 3
  Filled 2017-11-21 (×3): qty 2

## 2017-11-21 MED ORDER — ISOSORBIDE MONONITRATE ER 60 MG PO TB24
90.0000 mg | ORAL_TABLET | Freq: Every day | ORAL | Status: DC
  Administered 2017-11-22 – 2017-11-24 (×3): 90 mg via ORAL
  Filled 2017-11-21 (×2): qty 1
  Filled 2017-11-21: qty 3

## 2017-11-21 MED ORDER — ONDANSETRON HCL 4 MG/2ML IJ SOLN: 4.0000 mg | Freq: Four times a day (QID) | INTRAMUSCULAR | Status: DC | PRN

## 2017-11-21 MED ORDER — FERROUS SULFATE 325 (65 FE) MG PO TABS
325.0000 mg | ORAL_TABLET | Freq: Two times a day (BID) | ORAL | Status: DC
  Administered 2017-11-22 – 2017-11-24 (×4): 325 mg via ORAL
  Filled 2017-11-21 (×5): qty 1

## 2017-11-21 MED ORDER — LATANOPROST 0.005 % OP SOLN
1.0000 [drp] | Freq: Every day | OPHTHALMIC | Status: DC
  Filled 2017-11-21 (×2): qty 2.5

## 2017-11-21 MED ORDER — ASPIRIN 81 MG PO CHEW
324.0000 mg | CHEWABLE_TABLET | Freq: Once | ORAL | Status: AC
  Administered 2017-11-21: 324 mg via ORAL
  Filled 2017-11-21: qty 4

## 2017-11-21 MED ORDER — METOPROLOL TARTRATE 100 MG PO TABS
100.0000 mg | ORAL_TABLET | Freq: Two times a day (BID) | ORAL | Status: DC
  Administered 2017-11-22 – 2017-11-24 (×4): 100 mg via ORAL
  Filled 2017-11-21: qty 2
  Filled 2017-11-21: qty 4
  Filled 2017-11-21 (×4): qty 1

## 2017-11-21 MED ORDER — SODIUM BICARBONATE 650 MG PO TABS
650.0000 mg | ORAL_TABLET | Freq: Two times a day (BID) | ORAL | Status: DC
  Administered 2017-11-23 (×2): 650 mg via ORAL
  Filled 2017-11-21 (×6): qty 1

## 2017-11-21 MED ORDER — DORZOLAMIDE HCL 2 % OP SOLN
1.0000 [drp] | Freq: Two times a day (BID) | OPHTHALMIC | Status: DC
  Administered 2017-11-22 – 2017-11-24 (×4): 1 [drp] via OPHTHALMIC
  Filled 2017-11-21: qty 10

## 2017-11-21 MED ORDER — GI COCKTAIL ~~LOC~~
30.0000 mL | Freq: Four times a day (QID) | ORAL | Status: DC | PRN
  Administered 2017-11-22: 30 mL via ORAL
  Filled 2017-11-21: qty 30

## 2017-11-21 MED ORDER — MORPHINE SULFATE (PF) 4 MG/ML IV SOLN
2.0000 mg | INTRAVENOUS | Status: DC | PRN
  Administered 2017-11-22 – 2017-11-23 (×2): 2 mg via INTRAVENOUS
  Filled 2017-11-21 (×3): qty 1

## 2017-11-21 MED ORDER — FUROSEMIDE 80 MG PO TABS
80.0000 mg | ORAL_TABLET | Freq: Every day | ORAL | Status: DC
  Administered 2017-11-22 – 2017-11-24 (×3): 80 mg via ORAL
  Filled 2017-11-21: qty 4
  Filled 2017-11-21 (×2): qty 1

## 2017-11-21 MED ORDER — NITROGLYCERIN 0.4 MG SL SUBL
0.4000 mg | SUBLINGUAL_TABLET | SUBLINGUAL | Status: DC | PRN
  Administered 2017-11-21: 0.4 mg via SUBLINGUAL
  Filled 2017-11-21: qty 1

## 2017-11-21 MED ORDER — HEPARIN SODIUM (PORCINE) 5000 UNIT/ML IJ SOLN
5000.0000 [IU] | Freq: Three times a day (TID) | INTRAMUSCULAR | Status: DC
  Filled 2017-11-21 (×3): qty 1

## 2017-11-21 NOTE — ED Notes (Signed)
Pharmacy messaged about unverified meds 

## 2017-11-21 NOTE — ED Notes (Signed)
Pt reports having [employer] paper bag with denim purse inside when he was brought in by EMS. This rn checked lobby, A1 (where pt was triaged), and spoke with triage RN Jerene Pitch who reports no belongings were given to her. Pt made aware

## 2017-11-21 NOTE — ED Triage Notes (Signed)
[facility]- Pt coming from home with complaint of chest pain. He has taken 8 ASA in the last 1 hour, ST initially at 120, HR now 104. Pt alert oriented. Pt also reports ETOH. CP 1/10 at this time. No nitro given. BP 130/85. CP is located upper chest, closer to his neck. No distress noted.

## 2017-11-21 NOTE — ED Notes (Signed)
Dr. Ashok Cordia notified of Troponin = 0.34

## 2017-11-21 NOTE — ED Notes (Signed)
Admitting at bedside 

## 2017-11-21 NOTE — ED Notes (Signed)
Pt endorses upper chest pain bilaterally. Pt believes it might be anxiety and might also be caused by low hemoglobin which is usually the cause. Pt states he took 648mg  of ASA. Current pain level now is a 1.

## 2017-11-21 NOTE — ED Provider Notes (Signed)
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  States that for the past week he has had gross blood coming from his ileal conduit.  He has not on antiplatelets or anticoagulants.  He denies abdominal pain or distention.  States that this happens to him intermittently.  He was just admitted to the hospital January 23 for the same and received a blood transfusion.  States whenever his hemoglobin drops below 8 he starts to have chest discomfort.  Described as central, tightness that is dull in nature and worse with exertion.  No associated shortness of breath, nausea, dizziness, diaphoresis.  No fever or cough.  States he had 4 episodes of chest discomfort in the past 48 hours.  No chest pain currently.   Patient has refused cardiac catheterization as he states he has been told that he will need dialysis afterwards.  He also refuses dialysis.  Patient is a DNR/DNI.  He states he is really only here because he wants to make sure that he is hemoglobin is not low.  He states he has been previously told at times to be on antibiotics when his urine becomes bloody but he is refusing antibiotics now.  He was seen recently by his urologist who stated to him that this would be intermittent for him.  ROS: See HPI Constitutional: no fever  Eyes: no drainage  ENT: no runny nose   Cardiovascular:   chest pain  Resp: no SOB  GI: no vomiting GU: gross hematuria Integumentary: no rash  Allergy: no hives  Musculoskeletal: no leg swelling  Neurological: no slurred speech ROS otherwise negative  PAST MEDICAL HISTORY/PAST SURGICAL HISTORY:  Past Medical History:  Diagnosis Date  . Anemia    . Anemia, chronic renal failure   . Anxiety   . Aortic regurgitation 08/13/2017   moderate  . Aortic stenosis 08/13/2017   mild  . Bladder cancer ([place])   . Chronic diarrhea    "I've had it for 10 years" (11/16/2017)  . Chronic lower back pain   . ESRD (end stage renal disease) ([place])    [name] 11/16/2017  . H/O sinus tachycardia   . Heart murmur   . History of blood transfusion "several"   "low blood; having #12 right now" (11/16/2017)  . History of kidney stones   . Hypertension   . Mitral regurgitation 08/13/2017   mild  . Non-ST elevation (NSTEMI) myocardial infarction ([place])   . Pneumonia 2018 X 1  . Pre-diabetes   . Presence of urostomy ([place])   . Recurrent hematuria    [name] 11/16/2017 (11/16/2017)  . Secondary hyperparathyroidism ([place])     MEDICATIONS:  Prior to Admission medications   Medication Sig Start Date End Date Taking? Authorizing Provider  calcium carbonate (TUMS EX) 750 MG chewable tablet Chew 3 tablets by mouth 2 (two) times daily. LUNCH and SUPPER    [provider]  dorzolamide (TRUSOPT) 2 % ophthalmic solution Place 1 drop into the right eye 2 (two) times daily.     [provider]  ferrous sulfate (IRON SUPPLEMENT) 325 (65 FE) MG tablet Take 325 mg by mouth 2 (two) times daily with a meal. LUNCH and SUPPER    [provider]  furosemide (LASIX) 80 MG tablet Take 80 mg by mouth daily.     [provider]  isosorbide mononitrate (IMDUR) 30 MG 24 hr tablet Take 3 tablets (90 mg total) by mouth daily. 09/22/17   Geradine Girt, DO  latanoprost (XALATAN) 0.005 % ophthalmic solution Place 1 drop into both eyes daily. 5 PM    [provider]  metoprolol tartrate (LOPRESSOR) 100 MG tablet Take 100 mg by mouth 2 (two) times daily. LUNCH and BEDTIME    [provider]  sodium bicarbonate 650 MG tablet Take 1 tablet (650 mg total) by mouth 2 (two) times daily. 10/23/17 11/22/17  Florencia Reasons, MD    ALLERGIES:  No  Known Allergies  SOCIAL HISTORY:  Social History   Tobacco Use  . Smoking status: Former Smoker    Packs/day: 3.00    Years: 47.00    Pack years: 141.00    Types: Cigarettes, Pipe    Last attempt to quit: 03/22/2006    Years since quitting: 11.6  . Smokeless tobacco: Never Used  Substance Use Topics  . Alcohol use: Yes    Alcohol/week: 17.4 oz    Types: 28 Glasses of wine, 1 Cans of beer per week    FAMILY HISTORY: Family History  Problem Relation Age of Onset  . Cancer Mother        BRAIN    EXAM: BP (!) 113/55 (BP Location: Right Arm)   Pulse 78   Temp 97.6 F (36.4 C) (Oral)   Resp 12   Ht 5\' 10"  (1.778 m)   Wt 73.5 kg (162 lb)   SpO2 100%   BMI 23.24 kg/m  CONSTITUTIONAL: Alert and oriented and responds appropriately to questions.  Elderly, afebrile, in no distress HEAD: Normocephalic EYES: Conjunctivae clear, pupils appear equal, EOMI ENT: normal nose; moist mucous membranes NECK: Supple, no meningismus, no nuchal rigidity, no LAD  CARD: RRR; S1 and S2 appreciated; no murmurs, no clicks, no rubs, no gallops RESP: Normal chest excursion without splinting or tachypnea; breath sounds clear and equal bilaterally; no wheezes, no rhonchi, no rales, no hypoxia or respiratory distress, speaking full sentences ABD/GI: Normal bowel sounds; non-distended; soft, non-tender, no rebound, no guarding, no peritoneal signs, no hepatosplenomegaly BACK:  The back appears normal and is non-tender to palpation, there is no CVA tenderness EXT: Normal ROM in all joints; non-tender to palpation; no edema; normal capillary refill; no cyanosis, no calf tenderness or swelling    SKIN: Normal color for age and race; warm; no rash NEURO: Moves all extremities equally PSYCH: The patient's mood and manner are appropriate. Grooming and personal hygiene are appropriate.  MEDICAL DECISION MAKING: Patient here with concerns that he could be anemic and need a blood transfusion.  Does have a gross  hematuria from his ileal conduit.  No abdominal pain.  States he has had 4 episodes of brief chest pain over the past 48 hours.  He does frequently have angina.  Does not feel like this feels any different but states that when he has these episodes he is concerned that he is hemoglobin is low and he needs transfusion like he did several days ago.  Will obtain cardiac labs and check his coags and hemoglobin today.  Patient has refused cardiac catheterization, dialysis previously.  ED PROGRESS: Patient's hemoglobin is actually 17 today.  His labs otherwise are at baseline.  His creatinine is 9 with a potassium of 5.2.  No EKG changes.  Again he refuses dialysis.  States he is interested in quality of life and not quality.  Chest x-ray is clear without sign of volume overload.    Troponin is 0.06 which is stable for him in the setting of his chronic kidney disease.  He has no chest pain currently in the last episode was hours ago.  He denies that his pain is exertional.  States he wakes up with it and thinks that it is anxiety.  He reports he is already on isosorbide.  He does not want further cardiac workup at this time as he states he would not do anything else about it.     Urine shows gross blood and unable to evaluate any further because of how much blood is present.  Will send a urine culture.  Had a CT 11/16/17 which showed no acute abnormality.  He does not want antibiotics at this time and I feel this is reasonable.  I have reviewed patient's recent urology note on January 23.  Blood they report that standard recommendation would be to consider further evaluation including imaging with a loopogram versus nephrostomy tube placement and nephrostogram considering his inability to receive IV contrast.  However given his other medical comorbidities they did not necessary feel that aggressive evaluation of his hematuria was indicated and patient agreed to palliative measures during that visit.  He states  that he mainly came in because he wanted to make sure that he did not need another blood transfusion.  He feels comfortable with plan to follow-up with his urologist as needed and PCP as an outpatient.  We will have his primary care physician recheck his blood work next week.  Discussed at length return precautions.   At this time, I do not feel there is any life-threatening condition present. I have reviewed and discussed all results (EKG, imaging, lab, urine as appropriate) and exam findings with patient/family. I have reviewed nursing notes and appropriate previous records.  I feel the patient is safe to be discharged home without further emergent workup and can continue workup as an outpatient as needed. Discussed usual and customary return precautions. Patient/family verbalize understanding and are comfortable with this plan.  Outpatient follow-up has been provided if needed. All questions have been answered.     Date: 11/21/2017 4:56 AM  Rate: 89  Rhythm: normal sinus rhythm  QRS Axis: normal  Intervals: Right bundle branch block, left anterior fascicular block  ST/T Wave abnormalities: normal  Conduction Disutrbances: none  Narrative Interpretation: Bifascicular block, ST depression and T wave inversions diffusely, no change compared to previous EKG November 12, 2017     EKG Interpretation  Date/Time:  Monday November 21 2017 06:00:07 EST Ventricular Rate:  83 PR Interval:    QRS Duration: 141 QT Interval:  423 QTC Calculation: 498 R Axis:   90 Text Interpretation:  Sinus rhythm Ventricular bigeminy Right bundle branch block Repol abnrm suggests ischemia, diffuse leads No significant change since last tracing Confirmed by Ward, Cyril Mourning [phone]) on 11/21/2017 6:21:55 AM            Ward, Delice Bison, DO 11/21/17 6045

## 2017-11-21 NOTE — ED Provider Notes (Signed)
[facility] EMERGENCY DEPARTMENT Provider Note   CSN: [id number] Arrival date & time: 11/21/17  1546     History   Chief Complaint Chief Complaint  Patient presents with  . Chest Pain    HPI Matthew Mays is a 77 y.o. male.  HPI   Matthew Mays is a 77 y.o. male, with a history of multiple blood transfusions, ESRD, HTN, mitral regurg, aortic regurg, and an STEMI, presenting to the ED with intermittent chest pain beginning this morning.  States pain woke him from sleep.  Pain is across the upper chest, radiating towards the left shoulder, currently 2/10, dull in nature.  Accompanied by some difficulty breathing on exertion. Patient took 324mg  ASA prior to arrival. Patient also notes gross hematuria in his urostomy over the past week.  He has not contacted his urologist on this matter. Patient states he has not a dialysis patient.  States, "I have resisted that for 10 years."  Denies fever/chills, increased coughing, peripheral edema, N/V/D, dizziness, diaphoresis, or any other complaints.    Past Medical History:  Diagnosis Date  . Anemia   . Anemia, chronic renal failure   . Anxiety   . Aortic regurgitation 08/13/2017   moderate  . Aortic stenosis 08/13/2017   mild  . Bladder cancer ([place])   . Chronic diarrhea    "I've had it for 10 years" (11/16/2017)  . Chronic lower back pain   . ESRD (end stage renal disease) ([place])    [name] 11/16/2017  . H/O sinus tachycardia   . Heart murmur   . History of blood transfusion "several"   "low blood; having #12 right now" (11/16/2017)  . History of kidney stones   . Hypertension   . Mitral regurgitation 08/13/2017   mild  . Non-ST elevation (NSTEMI) myocardial infarction ([place])   . Pneumonia 2018 X 1  . Pre-diabetes   . Presence of urostomy ([place])   . Recurrent hematuria    [name] 11/16/2017 (11/16/2017)  . Secondary hyperparathyroidism [facility])     Patient Active Problem List   Diagnosis Date Noted  .  Malnutrition of moderate degree 11/17/2017  . Hematuria, gross 11/16/2017  . Chest pain 10/22/2017  . Anemia associated with stage 5 chronic renal failure ([place]) 09/20/2017  . Palliative care by specialist   . Advance care planning   . Atypical chest pain 08/12/2017  . Diarrhea 07/22/2017  . Hyponatremia 07/22/2017  . Hypokalemia 07/22/2017  . Elevated troponin 07/22/2017  . Chest discomfort 07/22/2017  . Hyperkalemia 07/22/2017  . Anemia 07/21/2017  . Glaucoma 07/21/2017  . Sepsis ([place]) 06/19/2016  . Hyperglycemia 06/19/2016  . AKI (acute kidney injury) ([place]) 06/19/2016  . CKD (chronic kidney disease), stage V ([place]) 06/19/2016  . End stage renal disease ([place]) 03/22/2012    Past Surgical History:  Procedure Laterality Date  . ARTERIOVENOUS GRAFT PLACEMENT Left 12/2005   [name] 03/09/2011  . AV FISTULA PLACEMENT  03/30/2012   Procedure: ARTERIOVENOUS (AV) FISTULA CREATION;  Surgeon: Angelia Mould, MD;  Location: [facility];  Service: Vascular;  Laterality: Left;  . CYSTECTOMY     w/ ileal diversion for bladder cancer   . PROSTATECTOMY    . TONSILLECTOMY    . URETHRECTOMY  12/2005   [name] 03/09/2011       Home Medications    Prior to Admission medications   Medication Sig Start Date End Date Taking? Authorizing Provider  calcium carbonate (TUMS EX) 750 MG chewable tablet Chew 3  tablets by mouth 2 (two) times daily. LUNCH and SUPPER    [provider]  dorzolamide (TRUSOPT) 2 % ophthalmic solution Place 1 drop into the right eye 2 (two) times daily.     [provider]  ferrous sulfate (IRON SUPPLEMENT) 325 (65 FE) MG tablet Take 325 mg by mouth 2 (two) times daily with a meal. LUNCH and SUPPER    [provider]  furosemide (LASIX) 80 MG tablet Take 80 mg by mouth daily.     [provider]  isosorbide mononitrate (IMDUR) 30 MG 24 hr tablet Take 3 tablets (90 mg total) by mouth daily. 09/22/17   Geradine Girt, DO  latanoprost  (XALATAN) 0.005 % ophthalmic solution Place 1 drop into both eyes at bedtime.     [provider]  metoprolol tartrate (LOPRESSOR) 100 MG tablet Take 100 mg by mouth 2 (two) times daily. LUNCH and BEDTIME    [provider]  sodium bicarbonate 650 MG tablet Take 1 tablet (650 mg total) by mouth 2 (two) times daily. 10/23/17 11/22/17  Florencia Reasons, MD    Family History Family History  Problem Relation Age of Onset  . Cancer Mother        BRAIN    Social History Social History   Tobacco Use  . Smoking status: Former Smoker    Packs/day: 3.00    Years: 47.00    Pack years: 141.00    Types: Cigarettes, Pipe    Last attempt to quit: 03/22/2006    Years since quitting: 11.6  . Smokeless tobacco: Never Used  Substance Use Topics  . Alcohol use: Yes    Alcohol/week: 17.4 oz    Types: 28 Glasses of wine, 1 Cans of beer per week  . Drug use: No     Allergies   Patient has no known allergies.   Review of Systems Review of Systems  Constitutional: Negative for chills, diaphoresis and fever.  Respiratory: Positive for shortness of breath. Negative for cough.   Cardiovascular: Positive for chest pain. Negative for palpitations and leg swelling.  Gastrointestinal: Negative for abdominal pain, diarrhea, nausea and vomiting.  Genitourinary: Positive for hematuria.  Neurological: Negative for dizziness, syncope and light-headedness.  All other systems reviewed and are negative.    Physical Exam Updated Vital Signs BP 135/63 (BP Location: Right Arm)   Pulse (!) 110   Temp (!) 97.3 F (36.3 C) (Oral)   Resp 20   SpO2 100%   Physical Exam  Constitutional: He appears well-developed and well-nourished. No distress.  HENT:  Head: Normocephalic and atraumatic.  Eyes: Conjunctivae are normal.  Neck: Neck supple.  Cardiovascular: Normal rate, regular rhythm, normal heart sounds and intact distal pulses.  Pulmonary/Chest: Effort normal and breath sounds normal. No  respiratory distress.  Patient shows no increased work of breathing.  Speaks in full sentences without difficulty.  Abdominal: Soft. There is no tenderness. There is no guarding.  Genitourinary:  Genitourinary Comments: Gross hematuria noted in urostomy bag.  Musculoskeletal: He exhibits no edema.  No noted lower extremity edema, erythema, or tenderness.  Lymphadenopathy:    He has no cervical adenopathy.  Neurological: He is alert.  Skin: Skin is warm and dry. He is not diaphoretic.  Psychiatric: He has a normal mood and affect. His behavior is normal.  Nursing note and vitals reviewed.    ED Treatments / Results  Labs (all labs ordered are listed, but only abnormal results are displayed) Labs Reviewed  BASIC  METABOLIC PANEL - Abnormal; Notable for the following components:      Result Value   Sodium 134 (*)    Potassium 5.3 (*)    Chloride 99 (*)    CO2 13 (*)    Glucose, Bld 271 (*)    BUN 153 (*)    Creatinine, Ser 8.93 (*)    Calcium 7.9 (*)    GFR calc non Af Amer 5 (*)    GFR calc Af Amer 6 (*)    Anion gap 22 (*)    All other components within normal limits  CBC - Abnormal; Notable for the following components:   WBC 10.7 (*)    RBC 2.86 (*)    Hemoglobin 8.8 (*)    HCT 26.4 (*)    RDW 17.6 (*)    All other components within normal limits  TROPONIN I - Abnormal; Notable for the following components:   Troponin I 1.83 (*)    All other components within normal limits  DIFFERENTIAL - Abnormal; Notable for the following components:   Neutro Abs 10.2 (*)    All other components within normal limits  CBC WITH DIFFERENTIAL/PLATELET - Abnormal; Notable for the following components:   WBC 11.0 (*)    RBC 3.05 (*)    Hemoglobin 9.4 (*)    HCT 28.1 (*)    RDW 17.7 (*)    Neutro Abs 9.4 (*)    All other components within normal limits  I-STAT TROPONIN, ED - Abnormal; Notable for the following components:   Troponin i, poc 0.34 (*)    All other components within  normal limits  PROTIME-INR  APTT  CBC WITH DIFFERENTIAL/PLATELET   Hemoglobin  Date Value Ref Range Status  11/21/2017 9.4 (L) 13.0 - 17.0 g/dL Final  11/21/2017 8.8 (L) 13.0 - 17.0 g/dL Final    Comment:    DELTA CHECK NOTED REPEATED TO VERIFY   11/21/2017 17.2 (H) 13.0 - 17.0 g/dL Final  11/17/2017 10.6 (L) 13.0 - 17.0 g/dL Final    Comment:    POST TRANSFUSION SPECIMEN     EKG  EKG Interpretation  Date/Time:  Monday November 21 2017 16:07:03 EST Ventricular Rate:  112 PR Interval:    QRS Duration: 134 QT Interval:  404 QTC Calculation: 551 R Axis:   -96 Text Interpretation:  Sinus tachycardia Right bundle branch block T wave abnormality, consider inferolateral ischemia Abnormal ECG No significant change since last tracing Confirmed by Addison Lank [phone]) on 11/21/2017 4:13:15 PM       Radiology Dg Chest 2 View  Result Date: 11/21/2017 CLINICAL DATA:  Mid chest pain tonight. EXAM: CHEST  2 VIEW COMPARISON:  11/16/2017 FINDINGS: Mild hyperinflation. Normal heart size and pulmonary vascularity. No focal airspace disease or consolidation in the lungs. No blunting of costophrenic angles. No pneumothorax. Mediastinal contours appear intact. Diffuse calcification of the aorta. IMPRESSION: No evidence of active pulmonary disease. Hyperinflation suggesting emphysema. Prominent aortic atherosclerosis. Electronically Signed   By: Lucienne Capers M.D.   On: 11/21/2017 04:32    Procedures .Critical Care Performed by: Lorayne Bender, PA-C Authorized by: Lorayne Bender, PA-C   Critical care provider statement:    Critical care time (minutes):  35   Critical care was necessary to treat or prevent imminent or life-threatening deterioration of the following conditions: NSTEMI.   Critical care was time spent personally by me on the following activities:  Development of treatment plan with patient or surrogate, discussions with consultants, evaluation  of patient's response to treatment,  examination of patient, obtaining history from patient or surrogate, review of old charts, re-evaluation of patient's condition, pulse oximetry, ordering and review of radiographic studies, ordering and review of laboratory studies and ordering and performing treatments and interventions   I assumed direction of critical care for this patient from another provider in my specialty: no     (including critical care time)  Medications Ordered in ED Medications  nitroGLYCERIN (NITROSTAT) SL tablet 0.4 mg (not administered)  aspirin chewable tablet 324 mg (324 mg Oral Given 11/21/17 2014)     Initial Impression / Assessment and Plan / ED Course  I have reviewed the triage vital signs and the nursing notes.  Pertinent labs & imaging results that were available during my care of the patient were reviewed by me and considered in my medical decision making (see chart for details).  Clinical Course as of Nov 21 2108  Mon Nov 21, 2017  9675 Patient's previously recorded value approximately 12 hours ago was 17.2.  If the patient truly dropped his hemoglobin almost 9 units in the last 12 hours, I would expect significant hemodynamic instability and a patient that is much more ill-appearing.  I am inclined to think that it is possible that the 17.2 value was falsely high. Hemoglobin: (!) 8.8 [SJ]  2041 Spoke with Dr. Teena Dunk, cardiology fellow. States they can not treat him any further. They can not do a cath on him due to his renal function and he can not be heparinized due to his anemia and hematuria. If he wants to get a cath, he will likely need dialysis. Recommends admission to medicine. They can continue to consult with cardiology as needed. He can have 325mg  ASA daily.    [SJ]  2050 Spoke with patient about his options.   Discussed the reasoning for being unable to do a cardiac cath or the heparin. Patient voiced understanding. All questions answered.  Patient is still resistant to the idea of having  dialysis. States, "That's no way to live." Discussed DNR wishes with the patient and explained the options. He states, "If I stop breathing or my heart stops, I don't want anything done. That's it for me." He remains pain-free.  [SJ]  2103 Spoke with Dr. Tamala Julian, hospitalist. Agrees to admit the patient.   [SJ]    Clinical Course User Index [SJ] Zeva Leber C, PA-C     Patient presents with chest pain beginning this morning.  Positive i-STAT troponin, verified with formal troponin I.  Patient received aspirin.   Patient admitted for NSTEMI. Limited options for treatment of this patient, as detailed above in ED course.  During discussions with patient, he expressed wishes to be made DNR. This information was passed on to the admitting team.    Findings and plan of care discussed with Duffy Bruce, MD. Dr. Ellender Hose personally evaluated and examined this patient.  Vitals:   11/21/17 1945 11/21/17 2000 11/21/17 2030 11/21/17 2045  BP: 135/63 (!) 142/58 134/61 134/64  Pulse: 92 88 88 88  Resp: 15 14  14   Temp:      TempSrc:      SpO2: 99% 100% 100% 100%     Final Clinical Impressions(s) / ED Diagnoses   Final diagnoses:  NSTEMI (non-ST elevated myocardial infarction) [facility])    ED Discharge Orders    None       Layla Maw 11/21/17 2110    Duffy Bruce, MD 11/21/17 2228

## 2017-11-21 NOTE — ED Provider Notes (Signed)
Patient placed in Quick Look pathway, seen and evaluated   Chief Complaint:   HPI:   76 YOM male presents today with complaints of chest pain.  Patient was seen in the emergency room this morning for similar symptoms.  He notes this is typical of his chest pain, with slow onset and slow resolution.  She notes taking 4 baby aspirin which seemed to help with symptoms.  Patient notes that generally when his chest hurts his hemoglobin is low.  He is not know why his hemoglobin goes low, but he does have a history of gross hematuria.  Patient reports this is a dull pressure in the top portion of his chest.   ROS: Chest pain (one)  Physical Exam:   Gen: No distress  Neuro: Awake and Alert  Skin: Warm    Focused Exam: Tachycardic regular rhythm with systolic murmur   Initiation of care has begun. The patient has been counseled on the process, plan, and necessity for staying for the completion/evaluation, and the remainder of the medical screening examination    Francee Gentile 11/21/17 [place]    Cardama, Grayce Sessions, MD 11/21/17 2126

## 2017-11-21 NOTE — ED Notes (Signed)
Called admitting for pt having pressure chest pain, gave 2 of nitro, HR up to 115 and EKG shot to show Dr

## 2017-11-21 NOTE — ED Notes (Signed)
ED Provider at bedside. 

## 2017-11-21 NOTE — ED Notes (Signed)
This RN called lab, now running CBC.

## 2017-11-21 NOTE — ED Triage Notes (Signed)
PT here via [facility] with c/o top of chest (across clavicles) pain x tonight. EMS called to home 14 times in the past month for same. States "red fluid" from illestomy and pt very anxious per EMS. Rates pain 1/10. Pt has taken 648mg  ASA in the past 4 hours tonight. +ETOH. EMS reports RBBB-pt has hx of same. EMS reports pt had recent blood transfusion due to low hgb.

## 2017-11-21 NOTE — ED Notes (Signed)
Arlean Hopping, PA notified of troponin. Per Shawn, repeat EKG captured.

## 2017-11-22 ENCOUNTER — Other Ambulatory Visit: Payer: Self-pay

## 2017-11-22 ENCOUNTER — Encounter (HOSPITAL_COMMUNITY): Payer: Self-pay | Admitting: General Practice

## 2017-11-22 DIAGNOSIS — Z66 Do not resuscitate: Secondary | ICD-10-CM | POA: Diagnosis not present

## 2017-11-22 DIAGNOSIS — Z87891 Personal history of nicotine dependence: Secondary | ICD-10-CM | POA: Diagnosis not present

## 2017-11-22 DIAGNOSIS — D72829 Elevated white blood cell count, unspecified: Secondary | ICD-10-CM | POA: Diagnosis not present

## 2017-11-22 DIAGNOSIS — I959 Hypotension, unspecified: Secondary | ICD-10-CM | POA: Diagnosis present

## 2017-11-22 DIAGNOSIS — I214 Non-ST elevation (NSTEMI) myocardial infarction: Principal | ICD-10-CM

## 2017-11-22 DIAGNOSIS — R739 Hyperglycemia, unspecified: Secondary | ICD-10-CM | POA: Diagnosis present

## 2017-11-22 DIAGNOSIS — R310 Gross hematuria: Secondary | ICD-10-CM | POA: Diagnosis present

## 2017-11-22 DIAGNOSIS — N2581 Secondary hyperparathyroidism of renal origin: Secondary | ICD-10-CM | POA: Diagnosis present

## 2017-11-22 DIAGNOSIS — Z515 Encounter for palliative care: Secondary | ICD-10-CM | POA: Diagnosis not present

## 2017-11-22 DIAGNOSIS — C679 Malignant neoplasm of bladder, unspecified: Secondary | ICD-10-CM | POA: Diagnosis not present

## 2017-11-22 DIAGNOSIS — E872 Acidosis: Secondary | ICD-10-CM | POA: Diagnosis present

## 2017-11-22 DIAGNOSIS — Z8551 Personal history of malignant neoplasm of bladder: Secondary | ICD-10-CM | POA: Diagnosis not present

## 2017-11-22 DIAGNOSIS — Z906 Acquired absence of other parts of urinary tract: Secondary | ICD-10-CM | POA: Diagnosis not present

## 2017-11-22 DIAGNOSIS — M545 Low back pain: Secondary | ICD-10-CM | POA: Diagnosis present

## 2017-11-22 DIAGNOSIS — Z9079 Acquired absence of other genital organ(s): Secondary | ICD-10-CM | POA: Diagnosis not present

## 2017-11-22 DIAGNOSIS — N186 End stage renal disease: Secondary | ICD-10-CM | POA: Diagnosis not present

## 2017-11-22 DIAGNOSIS — D631 Anemia in chronic kidney disease: Secondary | ICD-10-CM | POA: Diagnosis present

## 2017-11-22 DIAGNOSIS — R319 Hematuria, unspecified: Secondary | ICD-10-CM | POA: Diagnosis not present

## 2017-11-22 DIAGNOSIS — I080 Rheumatic disorders of both mitral and aortic valves: Secondary | ICD-10-CM | POA: Diagnosis present

## 2017-11-22 DIAGNOSIS — Z808 Family history of malignant neoplasm of other organs or systems: Secondary | ICD-10-CM | POA: Diagnosis not present

## 2017-11-22 DIAGNOSIS — I252 Old myocardial infarction: Secondary | ICD-10-CM | POA: Diagnosis not present

## 2017-11-22 DIAGNOSIS — I10 Essential (primary) hypertension: Secondary | ICD-10-CM | POA: Diagnosis not present

## 2017-11-22 DIAGNOSIS — I120 Hypertensive chronic kidney disease with stage 5 chronic kidney disease or end stage renal disease: Secondary | ICD-10-CM | POA: Diagnosis present

## 2017-11-22 DIAGNOSIS — D500 Iron deficiency anemia secondary to blood loss (chronic): Secondary | ICD-10-CM | POA: Diagnosis not present

## 2017-11-22 DIAGNOSIS — Z936 Other artificial openings of urinary tract status: Secondary | ICD-10-CM | POA: Diagnosis not present

## 2017-11-22 DIAGNOSIS — G8929 Other chronic pain: Secondary | ICD-10-CM | POA: Diagnosis present

## 2017-11-22 DIAGNOSIS — D6959 Other secondary thrombocytopenia: Secondary | ICD-10-CM | POA: Diagnosis not present

## 2017-11-22 LAB — BASIC METABOLIC PANEL
Anion gap: 20 — ABNORMAL HIGH (ref 5–15)
BUN: 159 mg/dL — ABNORMAL HIGH (ref 6–20)
CALCIUM: 7.5 mg/dL — AB (ref 8.9–10.3)
CO2: 12 mmol/L — AB (ref 22–32)
CREATININE: 9.06 mg/dL — AB (ref 0.61–1.24)
Chloride: 104 mmol/L (ref 101–111)
GFR calc Af Amer: 6 mL/min — ABNORMAL LOW (ref >60)
GFR calc non Af Amer: 5 mL/min — ABNORMAL LOW (ref >60)
GLUCOSE: 144 mg/dL — AB (ref 65–99)
Potassium: 6.4 mmol/L (ref 3.5–5.1)
Sodium: 136 mmol/L (ref 135–145)

## 2017-11-22 LAB — URINE CULTURE

## 2017-11-22 LAB — TROPONIN I
TROPONIN I: 6.2 ng/mL — AB (ref <0.03)
TROPONIN I: 7.19 ng/mL — AB (ref <0.03)
TROPONIN I: 7.59 ng/mL — AB (ref <0.03)

## 2017-11-22 LAB — GLUCOSE, CAPILLARY
Glucose-Capillary: 127 mg/dL — ABNORMAL HIGH (ref 65–99)
Glucose-Capillary: 157 mg/dL — ABNORMAL HIGH (ref 65–99)

## 2017-11-22 LAB — MRSA PCR SCREENING: MRSA BY PCR: NEGATIVE

## 2017-11-22 LAB — CBG MONITORING, ED
GLUCOSE-CAPILLARY: 127 mg/dL — AB (ref 65–99)
GLUCOSE-CAPILLARY: 188 mg/dL — AB (ref 65–99)
Glucose-Capillary: 116 mg/dL — ABNORMAL HIGH (ref 65–99)

## 2017-11-22 MED ORDER — INSULIN ASPART 100 UNIT/ML ~~LOC~~ SOLN
0.0000 [IU] | Freq: Three times a day (TID) | SUBCUTANEOUS | Status: DC
  Administered 2017-11-22: 2 [IU] via SUBCUTANEOUS
  Administered 2017-11-22: 1 [IU] via SUBCUTANEOUS
  Administered 2017-11-23: 5 [IU] via SUBCUTANEOUS
  Administered 2017-11-23: 1 [IU] via SUBCUTANEOUS
  Filled 2017-11-22: qty 1

## 2017-11-22 MED ORDER — INSULIN ASPART 100 UNIT/ML ~~LOC~~ SOLN: 0.0000 [IU] | Freq: Every day | SUBCUTANEOUS | Status: DC

## 2017-11-22 MED ORDER — SODIUM POLYSTYRENE SULFONATE PO POWD
30.0000 g | Freq: Once | ORAL | Status: AC
  Administered 2017-11-22: 30 g via ORAL

## 2017-11-22 MED ORDER — SODIUM POLYSTYRENE SULFONATE 15 GM/60ML PO SUSP: 30.0000 g | Freq: Once | ORAL | Status: DC

## 2017-11-22 NOTE — ED Notes (Signed)
Dr. Tamala Julian notified of troponin of7 .59, pt still having active CP and no relief of CP at this time. Tamala Julian, MD to change status to step-down. No new orders at this time. Will continue to monitor.

## 2017-11-22 NOTE — ED Notes (Signed)
Breakfast tray given. °

## 2017-11-22 NOTE — ED Notes (Signed)
I have order patient lunch 

## 2017-11-22 NOTE — Consult Note (Signed)
Cardiology Consultation:   Patient ID: Matthew Mays; [id number]; [birth year]   Admit date: 11/21/2017 Date of Consult: 11/22/2017  Primary Care Provider: Clovia Cuff, MD Primary Cardiologist: No primary care provider on file.  Primary Electrophysiologist:     Patient Profile:   Matthew Mays is a 77 y.o. male with a hx of CKD who is being seen today for the evaluation of  CHEST  PAIN at the request of .ER FOR  ABNL  EKG NSTEMI  Matthew Mays is a 77 y.o. 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  Chest pain is reported to be across his chest and severe.  Reports taken 4 baby aspirin with some intermittent relief of symptoms.  He reports having multiple episodes of similar type chest pain over the last 2 months.  Symptoms have sometimes been after eating a meal.  Denies having any diaphoresis, nausea, vomiting, abdominal pain, or shortness of breath.  Associated symptoms include palpitations which seems to worsen pain.  Otherwise patient notes that he still continues to have blood present in his urostomy for which he is required 2 units of blood transfused every 2 weeks or so.  He continues declined need of hemodialysis for end-stage renal disease even if this means possibility of death.  The patient's CODE STATUS was discussed and he states he would like to be a DO NOT RESUSCITATE. PER  ER  AND  IM  STAFF  ED Course: Upon admission into the emergency department patient was noted to be afebrile, heart rates 44-110, respirations 9-21, blood pressure 121/59-169/59, and O2 saturation maintained on room air.  Labs revealed WBC 11, hemoglobin 9.4, sodium 134, potassium 5.3, chloride 99, CO2 13, BUN 153, creatinine 8.93, glucose 271, and troponin I 1.83.  Cardiology was consulted, but patient refused dialysis and therefore recommended continued use of aspirin  Review of  Systems  Cardiovascular: Positive for chest pain.        History of Present Illness:   Matthew Mays is a 77 y.o. 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  States that for the past week he has had gross blood coming from his ileal conduit.    He has not on antiplatelets or anticoagulants.  He denies abdominal pain or distention.  States that this happens to him intermittently.  He was just admitted to the hospital January 23 for the same and received a blood transfusion.  States whenever his hemoglobin drops below 8 he starts to have chest discomfort.  D  escribed as central, tightness that is dull in nature and worse with exertion.  No associated shortness of breath, nausea, dizziness, diaphoresis.  No fever or cough.  States he had 4 episodes of chest discomfort in the past 48 hours.  No chest pain currently.   Patient has refused cardiac catheterization as he states he has been told that he will need dialysis afterwards.    He also refuses dialysis.  Patient is a DNR/DNI.  He states he is really only here because he wants to make sure that he is hemoglobin is not low.  He states he has been previously told at times to be on antibiotics when his urine becomes bloody but he is refusing antibiotics now.  He was seen recently by his urologist who stated to him that this would be intermittent for him.     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  Chest pain is reported to be across his chest and severe.  Reports  taken 4 baby aspirin with some intermittent relief of symptoms.  He reports having multiple episodes of similar type chest pain over the last 2 months.  Symptoms have sometimes been after eating a meal.  Denies having any diaphoresis, nausea, vomiting, abdominal pain, or shortness of breath.  Associated symptoms include palpitations which seems to worsen pain.  Otherwise patient notes that he still continues to have blood present in his urostomy for which he is required 2 units of blood transfused every 2 weeks or so.  He continues declined need of hemodialysis for end-stage renal disease even if this means possibility of death.  The patient's CODE STATUS was discussed and he states he would like to be a DO NOT RESUSCITATE. PER  ER  AND  IM  STAFF  ED Course: Upon admission into the emergency department patient was noted to be afebrile, heart rates 44-110, respirations 9-21, blood pressure 121/59-169/59, and O2 saturation maintained on room air.  Labs revealed WBC 11, hemoglobin 9.4, sodium 134, potassium 5.3, chloride 99, CO2 13, BUN 153, creatinine 8.93, glucose 271, and troponin I 1.83.  Cardiology was consulted, but patient refused dialysis and therefore recommended continued use of aspirin  Review of Systems  Cardiovascular: Positive for chest pain.      Past Medical History:  Diagnosis Date  . Anemia   . Anemia, chronic renal failure   . Anxiety   . Aortic regurgitation 08/13/2017   moderate  . Aortic stenosis 08/13/2017   mild  . Bladder cancer ([place])   . Chronic diarrhea    "I've had it for 10 years" (11/16/2017)  . Chronic lower back pain   . ESRD (end stage renal disease) ([place])    [name] 11/16/2017  . H/O sinus tachycardia   . Heart murmur   . History of blood transfusion "several"   "low blood; having #12 right now" (11/16/2017)  . History of kidney stones   . Hypertension   . Mitral regurgitation 08/13/2017   mild  . Non-ST elevation (NSTEMI) myocardial infarction ([place])    . Pneumonia 2018 X 1  . Pre-diabetes   . Presence of urostomy ([place])   . Recurrent hematuria    [name] 11/16/2017 (11/16/2017)  . Secondary hyperparathyroidism [facility])     Past Surgical History:  Procedure Laterality Date  . ARTERIOVENOUS GRAFT PLACEMENT Left 12/2005   [name] 03/09/2011  . AV FISTULA PLACEMENT  03/30/2012   Procedure: ARTERIOVENOUS (AV) FISTULA CREATION;  Surgeon: Angelia Mould, MD;  Location: [facility];  Service: Vascular;  Laterality: Left;  . CYSTECTOMY     w/ ileal diversion for bladder cancer   . PROSTATECTOMY    . TONSILLECTOMY    . URETHRECTOMY  12/2005   [name] 03/09/2011     Home Medications:  Prior to Admission medications   Medication Sig Start Date End Date Taking? Authorizing Provider  calcium carbonate (TUMS EX) 750 MG chewable tablet Chew 3 tablets by mouth 2 (two) times daily. LUNCH and SUPPER    [provider]  dorzolamide (TRUSOPT) 2 % ophthalmic solution Place 1 drop into the right eye 2 (two) times daily.     [provider]  ferrous sulfate (IRON SUPPLEMENT) 325 (65 FE) MG tablet Take 325 mg by mouth 2 (two) times daily with a meal. LUNCH and SUPPER    [provider]  furosemide (LASIX) 80 MG tablet Take 80 mg by mouth daily.     [provider]  isosorbide mononitrate (IMDUR) 30 MG 24 hr tablet Take 3 tablets (90 mg total) by mouth daily. 09/22/17   Geradine Girt, DO  latanoprost (XALATAN) 0.005 % ophthalmic solution Place 1 drop into both eyes at bedtime.     [provider]  metoprolol tartrate (LOPRESSOR) 100 MG tablet Take 100 mg by mouth 2 (two) times daily. LUNCH and BEDTIME    [provider]  sodium bicarbonate 650 MG tablet Take 1 tablet (650 mg total) by mouth 2 (two) times daily. 10/23/17 11/22/17  Florencia Reasons, MD    Inpatient Medications: Scheduled Meds: . aspirin  324 mg Oral Daily  . calcium carbonate  3 tablet Oral BID  . dorzolamide  1 drop Right Eye BID  . ferrous  sulfate  325 mg Oral BID WC  . furosemide  80 mg Oral Daily  . heparin  5,000 Units Subcutaneous Q8H  . insulin aspart  0-5 Units Subcutaneous QHS  . insulin aspart  0-9 Units Subcutaneous TID WC  . isosorbide mononitrate  90 mg Oral Daily  . latanoprost  1 drop Both Eyes QHS  . metoprolol tartrate  100 mg Oral BID  . sodium bicarbonate  650 mg Oral BID   Continuous Infusions:  PRN Meds: acetaminophen, gi cocktail, morphine injection, nitroGLYCERIN, ondansetron (ZOFRAN) IV  Allergies:   No Known Allergies  Social History:   Social History   Socioeconomic History  . Marital status: Widowed    Spouse name: Not on file  . Number of children: Not on file  . Years of education: Not on file  . Highest education level: Not on file  Social Needs  . Financial resource strain: Not on file  . Food insecurity - worry: Not on file  . Food insecurity - inability: Not on file  . Transportation needs - medical: Not on file  . Transportation needs - non-medical: Not on file  Occupational History  . Not on file  Tobacco Use  . Smoking status: Former Smoker    Packs/day: 3.00    Years: 47.00    Pack years: 141.00    Types: Cigarettes, Pipe    Last attempt to quit: 03/22/2006    Years since quitting: 11.6  . Smokeless tobacco: Never Used  Substance and Sexual Activity  . Alcohol use: Yes    Alcohol/week: 17.4 oz    Types: 28 Glasses of wine, 1 Cans of beer per week  . Drug use: No  . Sexual activity: No  Other Topics Concern  . Not on file  Social History Narrative  . Not on file    Family History:    Family History  Problem Relation Age of Onset  . Cancer Mother        BRAIN     ROS:  Please see the history of present illness.  All other ROS reviewed and negative.     Physical  Exam/Data:   Vitals:   11/22/17 0335 11/22/17 0400 11/22/17 0430 11/22/17 0500  BP: 114/65 101/60 120/60 126/63  Pulse: (!) 104     Resp: 20 18 17 17   Temp:      TempSrc:      SpO2: 98%  97% 98% 100%    Intake/Output Summary (Last 24 hours) at 11/22/2017 0552 Last data filed at 11/21/2017 2259 Gross per 24 hour  Intake -  Output 250 ml  Net -250 ml   There were no vitals filed for this visit. There is no height or weight on file to calculate BMI.  General:  Well nourished, well developed, in  acute distress HEENT: normal Lymph: no adenopathy Neck: no JVD Endocrine:  No thryomegaly Vascular: No carotid bruits; FA pulses 2+ bilaterally without bruits  Cardiac:  normal S1, S2; RRR; no murmur  Lungs:  clear to auscultation bilaterally, no wheezing, rhonchi or rales  Abd: soft, nontender, no hepatomegaly  Ext: no edema Musculoskeletal:  No deformities, BUE and BLE strength normal and equal Skin: warm and dry  Neuro:  CNs 2-12 intact, no focal abnormalities noted Psych:  Normal affect EKG:  The EKG was personally reviewed and demonstrates:   Telemetry:  Telemetry was personally reviewed and demonstrates:  AS  BELOW   Relevant CV Studies:  RECENT  EKG   SHOWS   DIFFUSE  ST  DEPRESSIONS  , SUGGESTIVE  OF   ISCHEMIA,  1/28  EKG  SHOWS  SLIGHT  WORSENING  OF  PRIOR  EKG    WITH  SIGNIFICSNT  DIFFUSE  ST  DEPRESSIONS  SUGGESTIVE OF  ISCHEMIA Laboratory Data:  Chemistry Recent Labs  Lab 11/17/17 0647 11/21/17 0406 11/21/17 1626  NA 136 138 134*  K 4.9 5.2* 5.3*  CL 106 102 99*  CO2 12* 14* 13*  GLUCOSE 100* 93 271*  BUN 144* 147* 153*  CREATININE 8.75* 9.00* 8.93*  CALCIUM 7.5* 8.2* 7.9*  GFRNONAA 5* 5* 5*  GFRAA 6* 6* 6*  ANIONGAP 18* 22* 22*    No results for input(s): PROT, ALBUMIN, AST, ALT, ALKPHOS, BILITOT in the last 168 hours. Hematology Recent Labs  Lab 11/21/17 0406 11/21/17 1626 11/21/17 1841  WBC 3.7* 10.7* 11.0*  RBC 5.60 2.86* 3.05*  HGB 17.2* 8.8* 9.4*  HCT 50.8 26.4* 28.1*  MCV 90.7 92.3 92.1  MCH 30.7 30.8 30.8  MCHC 33.9 33.3 33.5  RDW 17.6* 17.6* 17.7*  PLT 67* 177 173   Cardiac Enzymes Recent Labs  Lab 11/16/17 1944  11/16/17 2259 11/21/17 0406 11/21/17 1832 11/21/17 2308 11/22/17 0051  TROPONINI 0.05* 0.06* 0.06* 1.83* 7.59* 7.19*    Recent Labs  Lab 11/21/17 1710  TROPIPOC 0.34*    BNPNo results for input(s): BNP, PROBNP in the last 168 hours.  DDimer No results for input(s): DDIMER in the last 168 hours.  Radiology/Studies:  Dg Chest 2 View  Result Date: 11/21/2017 CLINICAL DATA:  Mid chest pain tonight. EXAM: CHEST  2 VIEW COMPARISON:  11/16/2017 FINDINGS: Mild hyperinflation. Normal heart size and pulmonary vascularity. No focal airspace disease or consolidation in the lungs. No blunting of costophrenic angles. No pneumothorax. Mediastinal contours appear intact. Diffuse calcification of the aorta. IMPRESSION: No evidence of active pulmonary disease. Hyperinflation suggesting emphysema. Prominent aortic atherosclerosis. Electronically Signed   By: Lucienne Capers M.D.   On: 11/21/2017 04:32    Assessment and Plan:    NSTEMI  Abnl EKG  CKD  ANEMIA HEMATURIA    Due  to hematuria and Anemia - Although NSTEMI - choosing med  Mgmt , only   Oxygen, asa, morphine   Reviewed  Echo  NSTEMI: Acute.  Patient presents with complaints of chest pain and initial troponin I 0.89., WHICH  WENT UP TO 7.5 , but due to patient not wanting to be on hemodialysis and hematuria.   Patient recommended to be on aspirin , oxygen, morphine  And  Consider  Based on blood  Pressure, isosorbide  dosing  - Admit to a telemetry bed - Continue to trend troponin - ASA - -I M  Staff  Considering  palliative care consult in a.m.   Leukocytosis: WBC elevated at 11 on admission.  No clear signs of infection noted in urine.  And chest x-ray showing no active pulmonary disease. - Recheck CBC in a.m.   ESRD: He continues to refuse to be on hemodialysis even if it means possible death. - Continue sodium bicarb  Anemia of chronic blood loss: Hemoglobin noted to be 9.4 on admission.  - Type and screen for possible  need of blood products - Transfuse blood products as needed - Recheck CBC in a.m.  Hematuria: Chronic.  PT/INR within normal limits. - Strict intake and output  Essential hypertension  - continue metoprolol and isosorbide mononitrate    Will follow up  Along  With IM     ROS: See HPI Constitutional: no fever  Eyes: no drainage  ENT: no runny nose   Cardiovascular:   chest pain  Resp: no SOB  GI: no vomiting GU: gross hematuria Integumentary: no rash  Allergy: no hives  Musculoskeletal: no leg swelling  Neurological: no slurred speech ROS otherwise negative  PAST MEDICAL HISTORY/PAST SURGICAL HISTORY:      Past Medical History:  Diagnosis Date  . Anemia   . Anemia, chronic renal failure   . Anxiety   . Aortic regurgitation 08/13/2017   moderate  . Aortic stenosis 08/13/2017   mild  . Bladder cancer ([place])   . Chronic diarrhea    "I've had it for 10 years" (11/16/2017)  . Chronic lower back pain   . ESRD (end stage renal disease) ([place])    [name] 11/16/2017  . H/O sinus tachycardia   . Heart murmur   . History of blood transfusion "several"   "low blood; having #12 right now" (11/16/2017)  . History of kidney stones   . Hypertension   . Mitral regurgitation 08/13/2017   mild  . Non-ST elevation (NSTEMI) myocardial infarction ([place])   . Pneumonia 2018 X 1  . Pre-diabetes   . Presence of urostomy ([place])   . Recurrent hematuria    [name] 11/16/2017 (11/16/2017)  . Secondary hyperparathyroidism ([place])     MEDICATIONS:         Prior to Admission medications   Medication Sig Start Date End Date Taking? Authorizing Provider  calcium carbonate (TUMS EX) 750 MG chewable tablet Chew 3 tablets by mouth 2 (two) times daily. LUNCH and SUPPER    [provider]  dorzolamide (TRUSOPT) 2 % ophthalmic solution Place 1 drop into the right eye 2 (two) times daily.     [provider]  ferrous sulfate (IRON SUPPLEMENT) 325 (65  FE) MG tablet Take 325 mg by mouth 2 (two) times daily with a meal. LUNCH and SUPPER    [provider]  furosemide (LASIX) 80 MG tablet Take 80 mg by mouth daily.     [provider]  isosorbide mononitrate (IMDUR)  30 MG 24 hr tablet Take 3 tablets (90 mg total) by mouth daily. 09/22/17   Geradine Girt, DO  latanoprost (XALATAN) 0.005 % ophthalmic solution Place 1 drop into both eyes daily. 5 PM    [provider]  metoprolol tartrate (LOPRESSOR) 100 MG tablet Take 100 mg by mouth 2 (two) times daily. LUNCH and BEDTIME    [provider]  sodium bicarbonate 650 MG tablet Take 1 tablet (650 mg total) by mouth 2 (two) times daily. 10/23/17 11/22/17  Florencia Reasons, MD    ALLERGIES:  No Known Allergies  SOCIAL HISTORY:  Social History        Tobacco Use  . Smoking status: Former Smoker    Packs/day: 3.00    Years: 47.00    Pack years: 141.00    Types: Cigarettes, Pipe    Last attempt to quit: 03/22/2006    Years since quitting: 11.6  . Smokeless tobacco: Never Used  Substance Use Topics  . Alcohol use: Yes    Alcohol/week: 17.4 oz    Types: 28 Glasses of wine, 1 Cans of beer per week    FAMILY HISTORY:      Family History  Problem Relation Age of Onset  . Cancer Mother        BRAIN    EXAM: BP (!) 113/55 (BP Location: Right Arm)   Pulse 78   Temp 97.6 F (36.4 C) (Oral)   Resp 12   Ht 5\' 10"  (1.778 m)   Wt 73.5 kg (162 lb)   SpO2 100%   BMI 23.24 kg/m  CONSTITUTIONAL: Alert and oriented and responds appropriately to questions.  Elderly, afebrile, in no distress HEAD: Normocephalic EYES: Conjunctivae clear, pupils appear equal, EOMI ENT: normal nose; moist mucous membranes NECK: Supple, no meningismus, no nuchal rigidity, no LAD  CARD: RRR; S1 and S2 appreciated; no murmurs, no clicks, no rubs, no gallops RESP: Normal chest excursion without splinting or tachypnea; breath sounds clear and equal  bilaterally; no wheezes, no rhonchi, no rales, no hypoxia or respiratory distress, speaking full sentences ABD/GI: Normal bowel sounds; non-distended; soft, non-tender, no rebound, no guarding, no peritoneal signs, no hepatosplenomegaly BACK:  The back appears normal and is non-tender to palpation, there is no CVA tenderness EXT: Normal ROM in all joints; non-tender to palpation; no edema; normal capillary refill; no cyanosis, no calf tenderness or swelling    SKIN: Normal color for age and race; warm; no rash NEURO: Moves all extremities equally PSYCH: The patient's mood and manner are appropriate. Grooming and personal hygiene are appropriate.    Urine shows gross blood and unable to evaluate any further because of how much blood is present.  Will send a urine culture.  Had a CT 11/16/17 which showed no acute abnormality.  He does not want antibiotics at this time and I feel this is reasonable.  I have reviewed patient's recent urology note on January 23.  Blood they report that standard recommendation would be to consider further evaluation including imaging with a loopogram versus nephrostomy tube placement and nephrostogram considering his inability to receive IV contrast.  However given his other medical comorbidities they did not necessary feel that aggressive evaluation of his hematuria was indicated and patient agreed to palliative measures during that visit.  He states that he mainly came in because he wanted to make sure that he did not need another blood transfusion.  He feels comfortable with plan to follow-up with his urologist as needed and  PCP as an outpatient.  We will have his primary care physician recheck his blood work next week.  Discussed at length return precautions.   At this time, I do not feel there is any life-threatening condition present. I have reviewed and discussed all results (EKG, imaging, lab, urine as appropriate) and exam findings with patient/family. I have  reviewed nursing notes and appropriate previous records.  I feel the patient is safe to be discharged home without further emergent workup and can continue workup as an outpatient as needed. Discussed usual and customary return precautions. Patient/family verbalize understanding and are comfortable with this plan.  Outpatient follow-up has been provided if needed. All questions have been answered.         For questions or updates, please contact [place] Please consult [email or web address] for contact info under Cardiology/STEMI.   Signed, Johna Sheriff, MD  11/22/2017 5:52 AM

## 2017-11-22 NOTE — Progress Notes (Addendum)
Progress Note  Patient Name: Matthew Mays Date of Encounter: 11/22/2017  Primary Cardiologist: New   Subjective   No chest pain or dyspnea at present  Inpatient Medications    Scheduled Meds: . aspirin  324 mg Oral Daily  . calcium carbonate  3 tablet Oral BID  . dorzolamide  1 drop Right Eye BID  . ferrous sulfate  325 mg Oral BID WC  . furosemide  80 mg Oral Daily  . heparin  5,000 Units Subcutaneous Q8H  . insulin aspart  0-5 Units Subcutaneous QHS  . insulin aspart  0-9 Units Subcutaneous TID WC  . isosorbide mononitrate  90 mg Oral Daily  . latanoprost  1 drop Both Eyes QHS  . metoprolol tartrate  100 mg Oral BID  . sodium bicarbonate  650 mg Oral BID   Continuous Infusions:  PRN Meds: acetaminophen, gi cocktail, morphine injection, nitroGLYCERIN, ondansetron (ZOFRAN) IV   Vital Signs    Vitals:   11/22/17 0830 11/22/17 0900 11/22/17 0930 11/22/17 1000  BP: (!) 94/55 112/61 108/61 104/62  Pulse:      Resp:  20 14 16   Temp:      TempSrc:      SpO2:  97% 96% 93%    Intake/Output Summary (Last 24 hours) at 11/22/2017 1219 Last data filed at 11/21/2017 2259 Gross per 24 hour  Intake -  Output 250 ml  Net -250 ml     Physical Exam   GEN: No acute distress.  Chronically ill appearing Neck: No JVD Cardiac: RRR, 2/6 systolic murmur Respiratory: Clear to auscultation bilaterally. GI: Soft, mildly distended, urostomy in place MS: No edema Neuro:  Nonfocal  Psych: Normal affect   Labs    Chemistry Recent Labs  Lab 11/21/17 0406 11/21/17 1626 11/22/17 0428  NA 138 134* 136  K 5.2* 5.3* 6.4*  CL 102 99* 104  CO2 14* 13* 12*  GLUCOSE 93 271* 144*  BUN 147* 153* 159*  CREATININE 9.00* 8.93* 9.06*  CALCIUM 8.2* 7.9* 7.5*  GFRNONAA 5* 5* 5*  GFRAA 6* 6* 6*  ANIONGAP 22* 22* 20*     Hematology Recent Labs  Lab 11/21/17 0406 11/21/17 1626 11/21/17 1841  WBC 3.7* 10.7* 11.0*  RBC 5.60 2.86* 3.05*  HGB 17.2* 8.8* 9.4*  HCT 50.8 26.4*  28.1*  MCV 90.7 92.3 92.1  MCH 30.7 30.8 30.8  MCHC 33.9 33.3 33.5  RDW 17.6* 17.6* 17.7*  PLT 67* 177 173    Cardiac Enzymes Recent Labs  Lab 11/21/17 1832 11/21/17 2308 11/22/17 0051 11/22/17 0428  TROPONINI 1.83* 7.59* 7.19* 6.20*    Recent Labs  Lab 11/21/17 1710  TROPIPOC 0.34*       Radiology    Dg Chest 2 View  Result Date: 11/21/2017 CLINICAL DATA:  Mid chest pain tonight. EXAM: CHEST  2 VIEW COMPARISON:  11/16/2017 FINDINGS: Mild hyperinflation. Normal heart size and pulmonary vascularity. No focal airspace disease or consolidation in the lungs. No blunting of costophrenic angles. No pneumothorax. Mediastinal contours appear intact. Diffuse calcification of the aorta. IMPRESSION: No evidence of active pulmonary disease. Hyperinflation suggesting emphysema. Prominent aortic atherosclerosis. Electronically Signed   By: Lucienne Capers M.D.   On: 11/21/2017 04:32     Patient Profile     77 y.o. male with ESRD (refuses dialysis), bladder cancer s/p urostomy (chronic bleeding requiring transfusion), HTN, NCB with NSTEMI.  Assessment & Plan    1 non-ST elevation myocardial infarction-plan to continue aspirin, beta-blocker and  isosorbide.  I have not added heparin because of bleeding from his urostomy.  Patient has refused dialysis and therefore would not be a candidate for cardiac catheterization due to certainty of contrast nephropathy following procedure.  He also refuses cardiac catheterization.  He is a no CODE BLUE.  He wants only medical therapy.  2 end-stage renal disease-patient refuses dialysis even if it means death.  3 no CODE BLUE  4 status post urostomy with chronic blood loss-would need to follow closely on aspirin.  If bleeding worsens we might need to discontinue aspirin.  Prognosis is poor. Spoke with [name], pt's daughter by phone at pt's request.  We have little to offer at his point; hospice/palliative care consult would be appropriate;  will sign off; please call with questions. Time spent 12:00-12:30 pm For questions or updates, please contact [place] Please consult [email or web address] for contact info under Cardiology/STEMI.      Signed, Kirk Ruths, MD  11/22/2017, 12:19 PM

## 2017-11-22 NOTE — Progress Notes (Signed)
Patient has refused to remove socks and shoes. Unable to assess heels for skin breakdown

## 2017-11-22 NOTE — ED Notes (Signed)
Tamala Julian, MD notified of pt continued chest pain and VS. Will continue to monitor.

## 2017-11-22 NOTE — ED Notes (Signed)
Patient c/o chest pressure, GI cocktail given , patient states he thinks its anxiety.

## 2017-11-22 NOTE — H&P (Signed)
History and Physical    Matthew Mays DOA: 11/21/2017  Referring MD/NP/PA:  Arlean Hopping PA-C PCP: Clovia Cuff, MD  Patient coming from: home   Chief Complaint: Chest pain I have personally briefly reviewed patient's old medical records in [place]   HPI: Matthew Mays is a 77 y.o. male with medical history significant of recurrent hematuria, s/p  ESRD not on HD, CAD, HTN, and bladder cancer s/p resection with ileostomy; who presents with complaints of chest pain.  Chest pain is reported to be across his chest and severe.  Reports taken 4 baby aspirin with some intermittent relief of symptoms.  He reports having multiple episodes of similar type chest pain over the last 2 months.  Symptoms have sometimes been after eating a meal.  Denies having any diaphoresis, nausea, vomiting, abdominal pain, or shortness of breath.  Associated symptoms include palpitations which seems to worsen pain.  Otherwise patient notes that he still continues to have blood present in his urostomy for which he is required 2 units of blood transfused every 2 weeks or so.  He continues declined need of hemodialysis for end-stage renal disease even if this means possibility of death.  The patient's CODE STATUS was discussed and he states he would like to be a DO NOT RESUSCITATE.  ED Course: Upon admission into the emergency department patient was noted to be afebrile, heart rates 44-110, respirations 9-21, blood pressure 121/59-169/59, and O2 saturation maintained on room air.  Labs revealed WBC 11, hemoglobin 9.4, sodium 134, potassium 5.3, chloride 99, CO2 13, BUN 153, creatinine 8.93, glucose 271, and troponin I 1.83.  Cardiology was consulted, but patient refused dialysis and therefore recommended continued use of aspirin  Review of Systems  Constitutional: Positive for malaise/fatigue. Negative for chills, diaphoresis and fever.  HENT: Negative for congestion and nosebleeds.   Eyes:  Negative for pain and discharge.  Respiratory: Negative for cough and shortness of breath.   Cardiovascular: Positive for chest pain. Negative for leg swelling.  Gastrointestinal: Negative for abdominal pain, nausea and vomiting.  Genitourinary: Positive for hematuria. Negative for dysuria.  Musculoskeletal: Negative for falls.  Skin: Negative for itching.  Neurological: Negative for focal weakness and loss of consciousness.  Endo/Heme/Allergies: Bruises/bleeds easily.  Psychiatric/Behavioral: Negative for substance abuse. The patient is not nervous/anxious.      Past Medical History:  Diagnosis Date  . Anemia   . Anemia, chronic renal failure   . Anxiety   . Aortic regurgitation 08/13/2017   moderate  . Aortic stenosis 08/13/2017   mild  . Bladder cancer ([place])   . Chronic diarrhea    "I've had it for 10 years" (11/16/2017)  . Chronic lower back pain   . ESRD (end stage renal disease) ([place])    [name] 11/16/2017  . H/O sinus tachycardia   . Heart murmur   . History of blood transfusion "several"   "low blood; having #12 right now" (11/16/2017)  . History of kidney stones   . Hypertension   . Mitral regurgitation 08/13/2017   mild  . Non-ST elevation (NSTEMI) myocardial infarction ([place])   . Pneumonia 2018 X 1  . Pre-diabetes   . Presence of urostomy ([place])   . Recurrent hematuria    [name] 11/16/2017 (11/16/2017)  . Secondary hyperparathyroidism [facility])     Past Surgical History:  Procedure Laterality Date  . ARTERIOVENOUS GRAFT PLACEMENT Left 12/2005   [name] 03/09/2011  . AV FISTULA PLACEMENT  03/30/2012  Procedure: ARTERIOVENOUS (AV) FISTULA CREATION;  Surgeon: Angelia Mould, MD;  Location: [facility];  Service: Vascular;  Laterality: Left;  . CYSTECTOMY     w/ ileal diversion for bladder cancer   . PROSTATECTOMY    . TONSILLECTOMY    . URETHRECTOMY  12/2005   /notes 03/09/2011     reports that he quit smoking about 11 years ago. His smoking use included  cigarettes and pipe. He has a 141.00 pack-year smoking history. he has never used smokeless tobacco. He reports that he drinks about 17.4 oz of alcohol per week. He reports that he does not use drugs.  No Known Allergies  Family History  Problem Relation Age of Onset  . Cancer Mother        BRAIN    Prior to Admission medications   Medication Sig Start Date End Date Taking? Authorizing Provider  calcium carbonate (TUMS EX) 750 MG chewable tablet Chew 3 tablets by mouth 2 (two) times daily. LUNCH and SUPPER    [provider]  dorzolamide (TRUSOPT) 2 % ophthalmic solution Place 1 drop into the right eye 2 (two) times daily.     [provider]  ferrous sulfate (IRON SUPPLEMENT) 325 (65 FE) MG tablet Take 325 mg by mouth 2 (two) times daily with a meal. LUNCH and SUPPER    [provider]  furosemide (LASIX) 80 MG tablet Take 80 mg by mouth daily.     [provider]  isosorbide mononitrate (IMDUR) 30 MG 24 hr tablet Take 3 tablets (90 mg total) by mouth daily. 09/22/17   Geradine Girt, DO  latanoprost (XALATAN) 0.005 % ophthalmic solution Place 1 drop into both eyes at bedtime.     [provider]  metoprolol tartrate (LOPRESSOR) 100 MG tablet Take 100 mg by mouth 2 (two) times daily. LUNCH and BEDTIME    [provider]  sodium bicarbonate 650 MG tablet Take 1 tablet (650 mg total) by mouth 2 (two) times daily. 10/23/17 11/22/17  Florencia Reasons, MD    Physical Exam:  Constitutional: Elderly male who appears chronically ill Vitals:   11/21/17 2300 11/21/17 2315 11/21/17 2330 11/22/17 0015  BP: (!) 112/56 (!) 108/59 110/63 107/61  Pulse: 92 (!) 106 (!) 111   Resp: 14 13 16 12   Temp:      TempSrc:      SpO2: 100% 100% 99% 100%   Eyes: PERRL, lids and conjunctivae normal ENMT: Mucous membranes are dry. Posterior pharynx clear of any exudate or lesions.   Neck: normal, supple, no masses, no thyromegaly Respiratory: clear to auscultation  bilaterally, no wheezing, no crackles. Normal respiratory effort. No accessory muscle use.  Cardiovascular: Regular rate and rhythm, no murmurs / rubs / gallops. No extremity edema. 2+ pedal pulses. No carotid bruits.  Abdomen: no tenderness, no masses palpated. No hepatosplenomegaly. Bowel sounds positive.  Ileostomy with blood present. Musculoskeletal: no clubbing / cyanosis. No joint deformity upper and lower extremities. Good ROM, no contractures. Normal muscle tone.  Skin: Bruising present  Neurologic: CN 2-12 grossly intact. Sensation intact, DTR normal. Strength 5/5 in all 4.  Psychiatric: Normal judgment and insight. Alert and oriented x 3. Normal mood.     Labs on Admission: I have personally reviewed following labs and imaging studies  CBC: Recent Labs  Lab 11/16/17 0521 11/17/17 0647 11/21/17 0406 11/21/17 1626 11/21/17 1841  WBC 7.5 8.5 3.7* 10.7* 11.0*  NEUTROABS 6.0  --  2.8 10.2* 9.4*  HGB 8.3*  10.6* 17.2* 8.8* 9.4*  HCT 24.9* 30.4* 50.8 26.4* 28.1*  MCV 89.9 88.1 90.7 92.3 92.1  PLT 128* 105* 67* 177 202   Basic Metabolic Panel: Recent Labs  Lab 11/16/17 0521 11/17/17 0647 11/21/17 0406 11/21/17 1626  NA 135 136 138 134*  K 5.1 4.9 5.2* 5.3*  CL 101 106 102 99*  CO2 14* 12* 14* 13*  GLUCOSE 102* 100* 93 271*  BUN 136* 144* 147* 153*  CREATININE 9.73* 8.75* 9.00* 8.93*  CALCIUM 8.2* 7.5* 8.2* 7.9*   GFR: Estimated Creatinine Clearance: 7.3 mL/min (A) (by C-G formula based on SCr of 8.93 mg/dL (H)). Liver Function Tests: No results for input(s): AST, ALT, ALKPHOS, BILITOT, PROT, ALBUMIN in the last 168 hours. No results for input(s): LIPASE, AMYLASE in the last 168 hours. No results for input(s): AMMONIA in the last 168 hours. Coagulation Profile: Recent Labs  Lab 11/21/17 0406 11/21/17 1833  INR 1.04 0.95   Cardiac Enzymes: Recent Labs  Lab 11/16/17 1944 11/16/17 2259 11/21/17 0406 11/21/17 1832 11/21/17 2308  TROPONINI 0.05* 0.06* 0.06*  1.83* 7.59*   BNP (last 3 results) No results for input(s): PROBNP in the last 8760 hours. HbA1C: No results for input(s): HGBA1C in the last 72 hours. CBG: No results for input(s): GLUCAP in the last 168 hours. Lipid Profile: No results for input(s): CHOL, HDL, LDLCALC, TRIG, CHOLHDL, LDLDIRECT in the last 72 hours. Thyroid Function Tests: No results for input(s): TSH, T4TOTAL, FREET4, T3FREE, THYROIDAB in the last 72 hours. Anemia Panel: No results for input(s): VITAMINB12, FOLATE, FERRITIN, TIBC, IRON, RETICCTPCT in the last 72 hours. Urine analysis:    Component Value Date/Time   COLORURINE RED (A) 11/21/2017 0410   APPEARANCEUR TURBID (A) 11/21/2017 0410   LABSPEC  11/21/2017 0410    TEST NOT REPORTED DUE TO COLOR INTERFERENCE OF URINE PIGMENT   PHURINE  11/21/2017 0410    TEST NOT REPORTED DUE TO COLOR INTERFERENCE OF URINE PIGMENT   GLUCOSEU (A) 11/21/2017 0410    TEST NOT REPORTED DUE TO COLOR INTERFERENCE OF URINE PIGMENT   HGBUR (A) 11/21/2017 0410    TEST NOT REPORTED DUE TO COLOR INTERFERENCE OF URINE PIGMENT   BILIRUBINUR (A) 11/21/2017 0410    TEST NOT REPORTED DUE TO COLOR INTERFERENCE OF URINE PIGMENT   KETONESUR (A) 11/21/2017 0410    TEST NOT REPORTED DUE TO COLOR INTERFERENCE OF URINE PIGMENT   PROTEINUR (A) 11/21/2017 0410    TEST NOT REPORTED DUE TO COLOR INTERFERENCE OF URINE PIGMENT   NITRITE (A) 11/21/2017 0410    TEST NOT REPORTED DUE TO COLOR INTERFERENCE OF URINE PIGMENT   LEUKOCYTESUR (A) 11/21/2017 0410    TEST NOT REPORTED DUE TO COLOR INTERFERENCE OF URINE PIGMENT   Sepsis Labs: Recent Results (from the past 240 hour(s))  Urine culture     Status: Abnormal   Collection Time: 11/16/17  5:38 AM  Result Value Ref Range Status   Specimen Description URINE, CATHETERIZED  Final   Special Requests NONE  Final   Culture <10,000 COLONIES/mL INSIGNIFICANT GROWTH (A)  Final   Report Status 11/17/2017 FINAL  Final     Radiological Exams on  Admission: Dg Chest 2 View  Result Date: 11/21/2017 CLINICAL DATA:  Mid chest pain tonight. EXAM: CHEST  2 VIEW COMPARISON:  11/16/2017 FINDINGS: Mild hyperinflation. Normal heart size and pulmonary vascularity. No focal airspace disease or consolidation in the lungs. No blunting of costophrenic angles. No pneumothorax. Mediastinal contours appear intact. Diffuse calcification  of the aorta. IMPRESSION: No evidence of active pulmonary disease. Hyperinflation suggesting emphysema. Prominent aortic atherosclerosis. Electronically Signed   By: Lucienne Capers M.D.   On: 11/21/2017 04:32    EKG: Independently reviewed.  Sinus rhythm with RBBB and LAFB QTC 495  Assessment/Plan NSTEMI: Acute.  Patient presents with complaints of chest pain and initial troponin I 0.89.  Cardiology consulted, but due to patient not wanting to be on hemodialysis and hematuria. Patient recommended to be on aspirin and they would follow. - Admit to a telemetry bed - Continue to trend troponin - ASA - Appreciate cardiology consultative services - Consider palliative care consult in a.m.   Leukocytosis: WBC elevated at 11 on admission.  No clear signs of infection noted in urine.  And chest x-ray showing no active pulmonary disease. - Recheck CBC in a.m.   ESRD: He continues to refuse to be on hemodialysis even if it means possible death.  Patient found to have BUN of 153 with creatinine 8.93 on admission.   - Continue sodium bicarb  Metabolic acidosis: suspect uremia is likely cause of elevated anion gap  Hyperglycemia: Patient's initial glucose noted to be 271. No previous history of diabetes.  Last hemoglobin A1c noted to be 5.7 on 09/20/2017. - Hypoglycemic protocols - CBGs q. before meals and at bedtime with sensitive SSI  Anemia due to chronic blood loss: Hemoglobin noted to be 9.4 on admission.  - Type and screen for possible need of blood products - Transfuse blood products as needed - Recheck CBC in  a.m.  Hematuria: Chronic.  PT/INR within normal limits. - Strict intake and output  Essential hypertension  - continue metoprolol and isosorbide mononitrate  DVT prophylaxis: lovenox Code Status: DNR Family Communication: No family present Disposition Plan: TBD  Consults called: cardiology  Admission status:Observation  Norval Morton MD [facility] Pager [phone]   If 7PM-7AM, please contact night-coverage [email or web address] Password [facility]  11/22/2017, 12:39 AM

## 2017-11-22 NOTE — ED Notes (Signed)
Repeat EKG shot. Tamala Julian, MD notified.

## 2017-11-22 NOTE — ED Notes (Signed)
Patient states he is still  Having chest pain however it is getting better. Patient was moved to hospital bed. States he is more comfortable.

## 2017-11-22 NOTE — Progress Notes (Signed)
[facility] PROGRESS NOTE  Matthew Mays SWN:[id number] DOB: 08/05/1941 DOA: 11/21/2017   PCP: Matthew Cuff, MD     Assessment/Plan: Principal Problem:   NSTEMI (non-ST elevated myocardial infarction) [name]) Active Problems:   ESRD (end stage renal disease) ([place])   Leukocytosis   Anemia due to chronic blood loss   Hematuria   Matthew Mays a 77 y.o.malewith medical history significant of recurrent hematuria, s/p ESRD not on HD, CAD, HTN, and bladder cancer s/p urostomy; who presents with complaints of chest pain,dx with  non-ST elevation myocardial ,seen by Matthew Mays , on  aspirin, beta-blocker and isosorbide.  not added heparin because of bleeding from his urostomy. pe cardiology not   a candidate for cardiac catheterization due to certainty of contrast nephropathy following procedure. He also refuses cardiac catheterization.   Patient also states that he  Recently fired Matthew Mays  was his nephrologist, as he kept insisting on starting hemodialysis. 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 He wants only medical therapy  Assessment/Plan NSTEMI: Acute.  Patient presents with complaints of chest pain and initial troponin I 0.89>7.59>7.19 .  Cardiology consulted, but due to patient not wanting to be on hemodialysis and  Has hematuria. Patient recommended to be on aspirin   -  have not added heparin because of bleeding from his urostomy.  Patient has refused dialysis and therefore would not be a candidate for cardiac catheterization due to certainty of contrast nephropathy following procedure.  He also refuses cardiac catheterization.  Palliative care consult recommended   - ASA - Appreciate cardiology consultative services - Consider palliative care consult in a.m.   Leukocytosis: WBC elevated at 11 on admission.  No clear signs of infection noted in urine.  And chest  x-ray showing no active pulmonary disease. - Recheck CBC in a.m.   ESRD: He continues to refuse to be on hemodialysis even if it means possible death.  Patient found to have BUN of 153 with creatinine 8.93 on admission.   - Continue sodium bicarb  Metabolic acidosis: suspect uremia is likely cause of elevated anion gap  Hyperglycemia: Patient's initial glucose noted to be 271. No previous history of diabetes.  Last hemoglobin A1c noted to be 5.7 on 09/20/2017. - Hypoglycemic protocols - will continue  sensitive SSI  Anemia due to chronic blood loss: Hemoglobin noted to be 9.4 on admission.  - Type and screen for possible need of blood products - Transfuse blood products as needed, he has had transfusions recently - Recheck CBC in a.m.  Hematuria: Chronic.  PT/INR within normal limits. - Strict intake and output  Essential hypertension  - continue metoprolol and isosorbide mononitrate      DVT prophylaxsis heparin  Code Status:  DNR    Code Status Orders  (From admission, onward)        Start     Ordered    Family Communication: Discussed in detail with the patient, all imaging results, lab results explained to the patient   Disposition Plan:   Cardiology recommended palliative care consult , which has been placed      Consultants:  Cardiology   palliative care   Procedures:  none  Antibiotics: Anti-infectives (From admission, onward)   None         HPI/Subjective: Does not want cath , states he always takes 4 baby aspirins to  relieve his chest pains  Objective: Vitals:   11/22/17 1000 11/22/17 1400 11/22/17 1500 11/22/17 1629  BP: 104/62 120/60 (!) 110/54 (!) 111/57  Pulse:  91 90 (!) 101  Resp: 16 18 18    Temp:    (!) 97.5 F (36.4 C)  TempSrc:    Oral  SpO2: 93% 99% 98% 94%  Weight:    78 kg (171 lb 15.3 oz)  Height:    5\' 10"  (1.778 m)    Intake/Output Summary (Last 24 hours) at 11/22/2017 1800 Last data filed at 11/21/2017  2259 Gross per 24 hour  Intake -  Output 250 ml  Net -250 ml    Exam:  Examination:  General exam: Appears calm and comfortable  Respiratory system: Clear to auscultation. Respiratory effort normal. Cardiovascular system: S1 & S2 heard, RRR. No JVD, murmurs, rubs, gallops or clicks. No pedal edema. Gastrointestinal system: Abdomen is nondistended, soft and nontender. No organomegaly or masses felt. Normal bowel sounds heard. Central nervous system: Alert and oriented. No focal neurological deficits. Extremities: Symmetric 5 x 5 power. Skin: No rashes, lesions or ulcers Psychiatry: Judgement and insight appear normal. Mood & affect appropriate.     Data Reviewed: I have personally reviewed following labs and imaging studies  Micro Results Recent Results (from the past 240 hour(s))  Urine culture     Status: Abnormal   Collection Time: 11/16/17  5:38 AM  Result Value Ref Range Status   Specimen Description URINE, CATHETERIZED  Final   Special Requests NONE  Final   Culture <10,000 COLONIES/mL INSIGNIFICANT GROWTH (A)  Final   Report Status 11/17/2017 FINAL  Final  Urine culture     Status: Abnormal   Collection Time: 11/21/17  4:10 AM  Result Value Ref Range Status   Specimen Description URINE, RANDOM  Final   Special Requests NONE  Final   Culture MULTIPLE SPECIES PRESENT, SUGGEST RECOLLECTION (A)  Final   Report Status 11/22/2017 FINAL  Final    Radiology Reports Ct Abdomen Pelvis Wo Contrast  Result Date: 11/16/2017 CLINICAL DATA:  Recent kidney infection. Gross hematuria. History of prostate cancer. EXAM: CT ABDOMEN AND PELVIS WITHOUT CONTRAST TECHNIQUE: Multidetector CT imaging of the abdomen and pelvis was performed following the standard protocol without IV contrast. COMPARISON:  CT 07/21/2012 FINDINGS: Lower chest: No acute abnormality. Dense coronary artery and valvular calcifications. Calcifications within the left ventricle likely involving the papillary muscles  and may be related to prior infarct. No effusions. Hepatobiliary: No focal hepatic abnormality. Gallbladder unremarkable. Pancreas: No focal abnormality or ductal dilatation. Spleen: No focal abnormality.  Normal size. Adrenals/Urinary Tract: Bilateral renal atrophy and cortical thinning. Numerous low-density lesions within the kidneys bilaterally, likely small cysts. High-density material noted within the right renal collecting system, pelvis and ureter, likely hematuria. No hydronephrosis. Right lower quadrant ileal conduit noted. Prior cystectomy. Adrenal glands unremarkable. Stomach/Bowel: Sigmoid and descending colonic diverticulosis. No active diverticulitis. Stomach and small bowel decompressed, unremarkable. Vascular/Lymphatic: Diffuse aortic, iliac, and branch vessel calcifications. No adenopathy. Reproductive: No visible focal abnormality. Other: No free fluid or free air. Musculoskeletal: No acute bony abnormality. IMPRESSION: Right lower quadrant ileal conduit noted. There is no hydronephrosis. High-density urine noted within the right renal collecting system and renal pelvis as well as the right ureter compatible with hematuria. Exact cause not visualized. No visible stones. Extensive renovascular calcifications. Sigmoid diverticulosis.  No active diverticulitis. Severe arterial atherosclerosis. Electronically Signed   By: Rolm Baptise M.D.   On: 11/16/2017 09:26  Dg Chest 2 View  Result Date: 11/21/2017 CLINICAL DATA:  Mid chest pain tonight. EXAM: CHEST  2 VIEW COMPARISON:  11/16/2017 FINDINGS: Mild hyperinflation. Normal heart size and pulmonary vascularity. No focal airspace disease or consolidation in the lungs. No blunting of costophrenic angles. No pneumothorax. Mediastinal contours appear intact. Diffuse calcification of the aorta. IMPRESSION: No evidence of active pulmonary disease. Hyperinflation suggesting emphysema. Prominent aortic atherosclerosis. Electronically Signed   By: Lucienne Capers M.D.   On: 11/21/2017 04:32   Dg Chest 2 View  Result Date: 11/16/2017 CLINICAL DATA:  Initial evaluation for acute upper chest pain. EXAM: CHEST  2 VIEW COMPARISON:  Prior radiograph from 11/11/2017. FINDINGS: Mild cardiomegaly, stable. Mediastinal silhouette within normal limits. Extensive aortic atherosclerosis again noted. Extensive peripheral calcifications noted as well. Lungs normally inflated. Mild bibasilar scattered bibasilar scarring and/or fibrosis. No focal infiltrates. No pulmonary edema or pleural effusion. No pneumothorax. No acute osseous abnormality.  Osteopenia noted. IMPRESSION: 1. Stable appearance of the chest with no active cardiopulmonary disease identified. 2. Extensive calcific aortic atherosclerosis. Electronically Signed   By: Jeannine Boga M.D.   On: 11/16/2017 05:25   Dg Chest 2 View  Result Date: 11/11/2017 CLINICAL DATA:  Chest pain EXAM: CHEST  2 VIEW COMPARISON:  November 04, 2017 FINDINGS: There is slight scarring in the lung bases. There is no edema or consolidation. The heart size and pulmonary vascularity are normal. No adenopathy. There is aortic atherosclerosis. There is extensive brachial and axillary artery calcification bilaterally. There is degenerative change in the thoracic spine. IMPRESSION: Extensive aortic atherosclerosis. Peripheral arterial vascular calcification bilaterally also noted. No edema or consolidation.  Mild bibasilar scarring. Aortic Atherosclerosis (ICD10-I70.0). Electronically Signed   By: Lowella Grip III M.D.   On: 11/11/2017 11:32   Dg Chest 2 View  Result Date: 11/04/2017 CLINICAL DATA:  Chest pain. EXAM: CHEST  2 VIEW COMPARISON:  And 10/21/2017. FINDINGS: Mediastinum and hilar structures normal. Mild cardiomegaly with normal pulmonary vascularity. No focal infiltrate. Mild left base subsegmental atelectasis. Vascular calcification noted. IMPRESSION: Low lung volumes with mild basilar atelectasis. No acute  cardiopulmonary disease. Electronically Signed   By: Marcello Moores  Register   On: 11/04/2017 13:22     CBC Recent Labs  Lab 11/16/17 0521 11/17/17 0647 11/21/17 0406 11/21/17 1626 11/21/17 1841  WBC 7.5 8.5 3.7* 10.7* 11.0*  HGB 8.3* 10.6* 17.2* 8.8* 9.4*  HCT 24.9* 30.4* 50.8 26.4* 28.1*  PLT 128* 105* 67* 177 173  MCV 89.9 88.1 90.7 92.3 92.1  MCH 30.0 30.7 30.7 30.8 30.8  MCHC 33.3 34.9 33.9 33.3 33.5  RDW 18.0* 17.4* 17.6* 17.6* 17.7*  LYMPHSABS 0.9  --  0.6* 0.7 1.1  MONOABS 0.4  --  0.2 0.4 0.4  EOSABS 0.2  --  0.1 0.1 0.1  BASOSABS 0.0  --  0.0 0.0 0.0    Chemistries  Recent Labs  Lab 11/16/17 0521 11/17/17 0647 11/21/17 0406 11/21/17 1626 11/22/17 0428  NA 135 136 138 134* 136  K 5.1 4.9 5.2* 5.3* 6.4*  CL 101 106 102 99* 104  CO2 14* 12* 14* 13* 12*  GLUCOSE 102* 100* 93 271* 144*  BUN 136* 144* 147* 153* 159*  CREATININE 9.73* 8.75* 9.00* 8.93* 9.06*  CALCIUM 8.2* 7.5* 8.2* 7.9* 7.5*   ------------------------------------------------------------------------------------------------------------------ estimated creatinine clearance is 7.2 mL/min (A) (by C-G formula based on SCr of 9.06 mg/dL (H)). ------------------------------------------------------------------------------------------------------------------ No results for input(s): HGBA1C in the last 72 hours. ------------------------------------------------------------------------------------------------------------------ No results for input(s): CHOL,  HDL, LDLCALC, TRIG, CHOLHDL, LDLDIRECT in the last 72 hours. ------------------------------------------------------------------------------------------------------------------ No results for input(s): TSH, T4TOTAL, T3FREE, THYROIDAB in the last 72 hours.  Invalid input(s): FREET3 ------------------------------------------------------------------------------------------------------------------ No results for input(s): VITAMINB12, FOLATE, FERRITIN, TIBC, IRON,  RETICCTPCT in the last 72 hours.  Coagulation profile Recent Labs  Lab 11/21/17 0406 11/21/17 1833  INR 1.04 0.95    No results for input(s): DDIMER in the last 72 hours.  Cardiac Enzymes Recent Labs  Lab 11/21/17 2308 11/22/17 0051 11/22/17 0428  TROPONINI 7.59* 7.19* 6.20*   ------------------------------------------------------------------------------------------------------------------ Invalid input(s): POCBNP   CBG: Recent Labs  Lab 11/22/17 0204 11/22/17 0733 11/22/17 1247 11/22/17 1636  GLUCAP 127* 116* 188* 127*       Studies: Dg Chest 2 View  Result Date: 11/21/2017 CLINICAL DATA:  Mid chest pain tonight. EXAM: CHEST  2 VIEW COMPARISON:  11/16/2017 FINDINGS: Mild hyperinflation. Normal heart size and pulmonary vascularity. No focal airspace disease or consolidation in the lungs. No blunting of costophrenic angles. No pneumothorax. Mediastinal contours appear intact. Diffuse calcification of the aorta. IMPRESSION: No evidence of active pulmonary disease. Hyperinflation suggesting emphysema. Prominent aortic atherosclerosis. Electronically Signed   By: Lucienne Capers M.D.   On: 11/21/2017 04:32      Lab Results  Component Value Date   HGBA1C 5.7 (H) 09/20/2017   HGBA1C 5.1 08/22/2017   HGBA1C 5.5 06/19/2016   Lab Results  Component Value Date   LDLCALC 62 07/22/2017   CREATININE 9.06 (H) 11/22/2017       Scheduled Meds: . aspirin  324 mg Oral Daily  . calcium carbonate  3 tablet Oral BID  . dorzolamide  1 drop Right Eye BID  . ferrous sulfate  325 mg Oral BID WC  . furosemide  80 mg Oral Daily  . heparin  5,000 Units Subcutaneous Q8H  . insulin aspart  0-5 Units Subcutaneous QHS  . insulin aspart  0-9 Units Subcutaneous TID WC  . isosorbide mononitrate  90 mg Oral Daily  . latanoprost  1 drop Both Eyes QHS  . metoprolol tartrate  100 mg Oral BID  . sodium bicarbonate  650 mg Oral BID   Continuous Infusions:   LOS: 0 days    Time  spent: >30 MINS    Reyne Dumas  [facility] Pager [phone]. If 7PM-7AM, please contact night-coverage at [email or web address], password [email or web address] 11/22/2017, 6:00 PM  LOS: 0 days

## 2017-11-22 NOTE — Progress Notes (Addendum)
Matthew Mays a 77 y.o.malewith medical history significant of recurrent hematuria, s/p ESRD not on HD, CAD, HTN, and bladder cancer s/p urostomy; who presents with complaints of chest pain,dx with  non-ST elevation myocardial ,seen by Dr Stanford Breed , on  aspirin, beta-blocker and isosorbide.  not added heparin because of bleeding from his urostomy. pe cardiology not   a candidate for cardiac catheterization due to certainty of contrast nephropathy following procedure.  He also refuses cardiac catheterization.  Patient also states that he  Recently fired Dr. Florene Glen  was his nephrologist, as he kept insisting on starting hemodialysis. 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  He wants only medical therapy  Cardiology recommended palliative care consult , which has been placed

## 2017-11-22 NOTE — ED Notes (Signed)
CBG 127 

## 2017-11-23 DIAGNOSIS — Z515 Encounter for palliative care: Secondary | ICD-10-CM

## 2017-11-23 DIAGNOSIS — D72829 Elevated white blood cell count, unspecified: Secondary | ICD-10-CM

## 2017-11-23 DIAGNOSIS — Z66 Do not resuscitate: Secondary | ICD-10-CM

## 2017-11-23 DIAGNOSIS — D500 Iron deficiency anemia secondary to blood loss (chronic): Secondary | ICD-10-CM

## 2017-11-23 DIAGNOSIS — R319 Hematuria, unspecified: Secondary | ICD-10-CM

## 2017-11-23 DIAGNOSIS — N186 End stage renal disease: Secondary | ICD-10-CM

## 2017-11-23 LAB — BASIC METABOLIC PANEL
Anion gap: 19 — ABNORMAL HIGH (ref 5–15)
BUN: 153 mg/dL — AB (ref 6–20)
CALCIUM: 6.9 mg/dL — AB (ref 8.9–10.3)
CHLORIDE: 104 mmol/L (ref 101–111)
CO2: 10 mmol/L — ABNORMAL LOW (ref 22–32)
CREATININE: 8.59 mg/dL — AB (ref 0.61–1.24)
GFR calc Af Amer: 6 mL/min — ABNORMAL LOW (ref >60)
GFR, EST NON AFRICAN AMERICAN: 5 mL/min — AB (ref >60)
Glucose, Bld: 243 mg/dL — ABNORMAL HIGH (ref 65–99)
Potassium: 5 mmol/L (ref 3.5–5.1)
SODIUM: 133 mmol/L — AB (ref 135–145)

## 2017-11-23 LAB — CBC
HCT: 23.4 % — ABNORMAL LOW (ref 39.0–52.0)
Hemoglobin: 7.7 g/dL — ABNORMAL LOW (ref 13.0–17.0)
MCH: 30.2 pg (ref 26.0–34.0)
MCHC: 32.9 g/dL (ref 30.0–36.0)
MCV: 91.8 fL (ref 78.0–100.0)
PLATELETS: 177 10*3/uL (ref 150–400)
RBC: 2.55 MIL/uL — ABNORMAL LOW (ref 4.22–5.81)
RDW: 17.7 % — AB (ref 11.5–15.5)
WBC: 10.3 10*3/uL (ref 4.0–10.5)

## 2017-11-23 LAB — BPAM RBC
Blood Product Expiration Date: 201902132359
Blood Product Expiration Date: 201902142359
UNIT TYPE AND RH: 6200
Unit Type and Rh: 6200

## 2017-11-23 LAB — TYPE AND SCREEN
ABO/RH(D): A POS
ANTIBODY SCREEN: NEGATIVE
UNIT DIVISION: 0
Unit division: 0

## 2017-11-23 LAB — GLUCOSE, CAPILLARY
GLUCOSE-CAPILLARY: 264 mg/dL — AB (ref 65–99)
GLUCOSE-CAPILLARY: 147 mg/dL — AB (ref 65–99)
GLUCOSE-CAPILLARY: 86 mg/dL (ref 65–99)
GLUCOSE-CAPILLARY: 101 mg/dL — AB (ref 65–99)

## 2017-11-23 LAB — PREPARE RBC (CROSSMATCH)

## 2017-11-23 MED ORDER — LORAZEPAM 2 MG/ML IJ SOLN
0.5000 mg | INTRAMUSCULAR | Status: DC | PRN
  Administered 2017-11-23 (×4): 0.5 mg via INTRAVENOUS
  Filled 2017-11-23 (×4): qty 1

## 2017-11-23 MED ORDER — MORPHINE SULFATE (CONCENTRATE) 10 MG/0.5ML PO SOLN: 5.0000 mg | ORAL | Status: DC | PRN

## 2017-11-23 MED ORDER — FENTANYL CITRATE (PF) 100 MCG/2ML IJ SOLN
25.0000 ug | INTRAMUSCULAR | Status: DC | PRN
  Administered 2017-11-23: 25 ug via INTRAVENOUS

## 2017-11-23 MED ORDER — FENTANYL CITRATE (PF) 100 MCG/2ML IJ SOLN
INTRAMUSCULAR | Status: AC
  Filled 2017-11-23: qty 2

## 2017-11-23 MED ORDER — SODIUM CHLORIDE 0.9 % IV SOLN: Freq: Once | INTRAVENOUS | Status: DC

## 2017-11-23 MED ORDER — ASPIRIN 81 MG PO CHEW
324.0000 mg | CHEWABLE_TABLET | Freq: Once | ORAL | Status: AC
  Administered 2017-11-23: 324 mg via ORAL

## 2017-11-23 NOTE — Consult Note (Signed)
Consultation Note Date: 11/23/2017   Patient Name: Matthew Mays  DOB: 1941/09/09  MRN: [id number]  Age / Sex: 77 y.o., male  PCP: Clovia Cuff, MD Referring Physician: Modena Jansky, MD  Reason for Consultation: Establishing goals of care  HPI/Patient Profile: 77 y.o. male admitted on 11/21/2017 with chest pain with evidence of NSTEMI. 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 During this admission he has reported intermittent chest pain across his chest.  Reports taken 4 baby aspirin with some intermittent relief of symptoms when at home. Patient also reports having blood present in his urostomy for which he is requiring  blood transfusions every 2 weeks or so. He continues declined need of hemodialysis for end-stage renal disease even if this means possibility of death.  He is alert and oriented and very much engaged in conversations regarding his current and past medical care.    Clinical Assessment and Goals of Care: I have reviewed medical records including EPIC notes, labs and imaging, received report from bedside nurse, assessed the patient and then met at the bedside along with Wadie Lessen, NP  to discuss diagnosis prognosis, GOC, EOL wishes, disposition and options.  We introduced Palliative Medicine as specialized medical care for people living with serious illness. It focuses on providing relief from the symptoms and stress of a serious illness. The goal is to improve quality of life for both the patient and the family. Patient reports he is aware of Palliative services as he has spoken to [facility] care in the past.   We discussed a brief life review of the patient. 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 He has 2 daughters (one lives in [place] and the other in [place]). He states he drove  up until about 4 months ago and when his health started to change and he became weaker he had to stop. He now uses home delivery services for his grocery and any other needs, while his daughters call daily to check on him and come in town to visit at times. He has a close friend who also is there to assist him with his needs.   As far as functional and nutritional status [name] verbalizes that he knows his health is declining. He states his appetite is fair and he eats what he wants when he wants it. He is aware that over the past few months his functional status has declined and he has become weaker, he often has diarrhea which he thinks is his body way of ridding some of the toxins from his kidneys. He verbalizes that his friends Abe People and Armida Sans are a great help to him and they actually have a self-managed home care company. He is looking to them to offer in home care once he discharged from the hospital. He does use a can on occassions and reports due to the bleeding and kidney function he has  now found a MD who has 3 APPs in their practice that comes to his home and does his check-ups and refills of his medications. He is also having to have his blood drawn more frequently and having to go have blood transfusions more often. He reports knowing that this will not last much longer because the doctor has told him that he will eventually began developing antibodies and it will be more difficult to receive the appropriate blood.   We discussed his current illness and what it means in the larger context of his on-going co-morbidities.  Natural disease trajectory and expectations at EOL were discussed.  I attempted to elicit values and goals of care important to the patient.    The difference between aggressive medical intervention and comfort care was considered in light of the patient's goals of care. At this time he is a DNR by request. He is aware of his kidney and heart conditions and states "I know  my time is near." Artifical feeding and hydration, and rehospitalization were considered and discussed, which he expressed he was not interested in.   Hospice and Palliative Care services outpatient were explained and offered. At this time [name] has expressed that he would like to go home with hospice services. He wants to continue having his PCP come into the home to assess him and will utilize his friends Abe People and Manuela Schwartz for his around the clock care needs in addition to hospice services. We did speak with Manuela Schwartz over the phone in the patient's presence who verified that she has been in conversation with the patient and his daughters and her team is ready to take on the care of providing him with his home care needs. She verbalized that they would actually be the ones that would be his support and come in to get a review of his plan upon discharge.   Questions and concerns were addressed. [name] was encouraged to call with questions or concerns.   Primary Decision Maker:  PATIENT-daughter Di Kindle is available if patient is not capable. HCPOA    SUMMARY OF RECOMMENDATIONS    Continue current plan of care and treat the treatable per [name]  PMT will continue to follow patient until discharge  Maintain DNR  Case Management to assist with in home Hospice services [facility])   Code Status/Advance Care Planning:  DNR  Palliative Prophylaxis:   Frequent Pain Assessment  Additional Recommendations (Limitations, Scope, Preferences):  Full Scope Treatment, however patient expresses and understanding that his health is approaching serious life-limiting prognosis  Psycho-social/Spiritual:   Desire for further Chaplaincy support:no  Additional Recommendations: Caregiving  Support/Resources, Education on Hospice and Referral to [employer]   Prognosis:   Unable to determine  Discharge Planning: Home with Hospice with out of pocket 24/7 in home care aides.       Primary Diagnoses: Present on Admission: . NSTEMI (non-ST elevated myocardial infarction) ([place]) . Leukocytosis . ESRD (end stage renal disease) ([place]) . Anemia due to chronic blood loss . Hematuria   I have reviewed the medical record, interviewed the patient and family, and examined the patient. The following aspects are pertinent.  Past Medical History:  Diagnosis Date  . Anemia   . Anemia, chronic renal failure   . Anginal pain ([place])   . Anxiety   . Aortic regurgitation 08/13/2017   moderate  . Aortic stenosis 08/13/2017   mild  . Bladder cancer ([place])   . Chronic diarrhea    "  I've had it for 10 years" (11/16/2017)  . Chronic lower back pain   . ESRD (end stage renal disease) ([place])    [name] 11/16/2017  . H/O sinus tachycardia   . Heart murmur   . History of blood transfusion "several"   "low blood; having #12 right now" (11/16/2017)  . History of kidney stones   . Hypertension   . Mitral regurgitation 08/13/2017   mild  . Non-ST elevation (NSTEMI) myocardial infarction ([place])   . Pneumonia 2018 X 1  . Pre-diabetes   . Presence of urostomy ([place])   . Recurrent hematuria    [name] 11/16/2017 (11/16/2017)  . Secondary hyperparathyroidism ([place])    Social History   Socioeconomic History  . Marital status: Widowed    Spouse name: None  . Number of children: None  . Years of education: None  . Highest education level: None  Social Needs  . Financial resource strain: None  . Food insecurity - worry: None  . Food insecurity - inability: None  . Transportation needs - medical: None  . Transportation needs - non-medical: None  Occupational History  . None  Tobacco Use  . Smoking status: Former Smoker    Packs/day: 3.00    Years: 47.00    Pack years: 141.00    Types: Cigarettes, Pipe    Last attempt to quit: 03/22/2006    Years since quitting: 11.6  . Smokeless tobacco: Never Used  Substance and Sexual Activity  . Alcohol use: Yes    Alcohol/week: 17.4  oz    Types: 28 Glasses of wine, 1 Cans of beer per week  . Drug use: No  . Sexual activity: No  Other Topics Concern  . None  Social History Narrative  . None   Family History  Problem Relation Age of Onset  . Cancer Mother        BRAIN   Scheduled Meds: . aspirin  324 mg Oral Daily  . calcium carbonate  3 tablet Oral BID  . dorzolamide  1 drop Right Eye BID  . ferrous sulfate  325 mg Oral BID WC  . furosemide  80 mg Oral Daily  . heparin  5,000 Units Subcutaneous Q8H  . insulin aspart  0-5 Units Subcutaneous QHS  . insulin aspart  0-9 Units Subcutaneous TID WC  . isosorbide mononitrate  90 mg Oral Daily  . latanoprost  1 drop Both Eyes QHS  . metoprolol tartrate  100 mg Oral BID  . sodium bicarbonate  650 mg Oral BID   Continuous Infusions: PRN Meds:.acetaminophen, gi cocktail, LORazepam, morphine injection, morphine CONCENTRATE, nitroGLYCERIN, ondansetron (ZOFRAN) IV Medications Prior to Admission:  Prior to Admission medications   Medication Sig Start Date End Date Taking? Authorizing Provider  calcium carbonate (TUMS EX) 750 MG chewable tablet Chew 3 tablets by mouth 2 (two) times daily. LUNCH and SUPPER    [provider]  dorzolamide (TRUSOPT) 2 % ophthalmic solution Place 1 drop into the right eye 2 (two) times daily.     [provider]  ferrous sulfate (IRON SUPPLEMENT) 325 (65 FE) MG tablet Take 325 mg by mouth 2 (two) times daily with a meal. LUNCH and SUPPER    [provider]  furosemide (LASIX) 80 MG tablet Take 80 mg by mouth daily.     [provider]  isosorbide mononitrate (IMDUR) 30 MG 24 hr tablet Take 3 tablets (90 mg total) by mouth daily. 09/22/17   Geradine Girt, DO  latanoprost (  XALATAN) 0.005 % ophthalmic solution Place 1 drop into both eyes at bedtime.     [provider]  metoprolol tartrate (LOPRESSOR) 100 MG tablet Take 100 mg by mouth 2 (two) times daily. LUNCH and BEDTIME    [provider]    No Known Allergies Review of Systems  Constitutional: Positive for appetite change and fatigue.  Neurological: Positive for weakness.    Physical Exam  Constitutional: He appears cachectic. He appears ill.  Generalized weakness, skin is warm, dry, intact   Cardiovascular: Normal rate.    Vital Signs: BP (!) 83/56   Pulse (!) 106   Temp (!) 97.5 F (36.4 C) (Oral)   Resp (!) 21   Ht '5\' 10"'$  (1.778 m)   Wt 75.6 kg (166 lb 10.7 oz)   SpO2 93%   BMI 23.91 kg/m  Pain Assessment: 0-10   Pain Score: 1    SpO2: SpO2: 93 % O2 Device:SpO2: 93 % O2 Flow Rate: .   IO: Intake/output summary:   Intake/Output Summary (Last 24 hours) at 11/23/2017 1445 Last data filed at 11/23/2017 0925 Gross per 24 hour  Intake 0 ml  Output 350 ml  Net -350 ml    LBM: Last BM Date: 11/23/17 Baseline Weight: Weight: 78 kg (171 lb 15.3 oz) Most recent weight: Weight: 75.6 kg (166 lb 10.7 oz)     Palliative Assessment/Data: PPS 40%     Time In: 1600 Time Out: 1715 Time Total: 75 min  Discussed with bedside nurse and case management.   Greater than 50%  of this time was spent counseling and coordinating care related to the above assessment and plan.  Signed by:  Alphonzo Grieve, AGNP-C Palliative Medicine Team  Phone: [phone] Fax: [phone]  Present for entire  visit above and agree  Wadie Lessen NP   Please contact Palliative Medicine Team phone at [phone] for questions and concerns.  For individual provider: See Shea Evans

## 2017-11-23 NOTE — Progress Notes (Signed)
Progress Note  Patient Name: Matthew Mays Date of Encounter: 11/23/2017  Primary Cardiologist: Dr Stanford Breed  Subjective   Woke up feeling mild CP and a little anxious. CP worsened>>5-6/10, denies SOB. RN gave ASA 81 mg x 4, w/ improvement.  SBP initially 80s during CP, improved. Morphine has not helped the pain.  As I spoke to him, he stated his pain was easing, refuses pain rx.  Inpatient Medications    Scheduled Meds: . aspirin  324 mg Oral Daily  . calcium carbonate  3 tablet Oral BID  . dorzolamide  1 drop Right Eye BID  . fentaNYL      . ferrous sulfate  325 mg Oral BID WC  . furosemide  80 mg Oral Daily  . heparin  5,000 Units Subcutaneous Q8H  . insulin aspart  0-5 Units Subcutaneous QHS  . insulin aspart  0-9 Units Subcutaneous TID WC  . isosorbide mononitrate  90 mg Oral Daily  . latanoprost  1 drop Both Eyes QHS  . metoprolol tartrate  100 mg Oral BID  . sodium bicarbonate  650 mg Oral BID   Continuous Infusions:  PRN Meds: acetaminophen, fentaNYL (SUBLIMAZE) injection, gi cocktail, morphine injection, nitroGLYCERIN, ondansetron (ZOFRAN) IV   Vital Signs    Vitals:   11/22/17 2130 11/22/17 2300 11/23/17 0354 11/23/17 0655  BP: (!) 113/59 (!) 99/56 (!) 94/54 (!) 83/56  Pulse: 96 93 (!) 106   Resp:  19 (!) 21   Temp:   (!) 97.5 F (36.4 C)   TempSrc:   Oral   SpO2:   95% 93%  Weight:   166 lb 10.7 oz (75.6 kg)   Height:        Intake/Output Summary (Last 24 hours) at 11/23/2017 2992 Last data filed at 11/22/2017 2029 Gross per 24 hour  Intake -  Output 350 ml  Net -350 ml     Physical Exam   GEN: moderate anxiety/discomfort, chronically ill Neck: no JVD Cardiac: Reg R&R, 2/6 SEM Respiratory: CTA bilaterally GI: soft, mod tender, urostomy in place MS: no edema Neuro:  no focal deficits Psych: anxious affect   Labs    Chemistry Recent Labs  Lab 11/21/17 1626 11/22/17 0428 11/23/17 0436  NA 134* 136 133*  K 5.3* 6.4* 5.0  CL 99*  104 104  CO2 13* 12* 10*  GLUCOSE 271* 144* 243*  BUN 153* 159* 153*  CREATININE 8.93* 9.06* 8.59*  CALCIUM 7.9* 7.5* 6.9*  GFRNONAA 5* 5* 5*  GFRAA 6* 6* 6*  ANIONGAP 22* 20* 19*     Hematology Recent Labs  Lab 11/21/17 0406 11/21/17 1626 11/21/17 1841  WBC 3.7* 10.7* 11.0*  RBC 5.60 2.86* 3.05*  HGB 17.2* 8.8* 9.4*  HCT 50.8 26.4* 28.1*  MCV 90.7 92.3 92.1  MCH 30.7 30.8 30.8  MCHC 33.9 33.3 33.5  RDW 17.6* 17.6* 17.7*  PLT 67* 177 173    Cardiac Enzymes Recent Labs  Lab 11/21/17 1832 11/21/17 2308 11/22/17 0051 11/22/17 0428  TROPONINI 1.83* 7.59* 7.19* 6.20*    Recent Labs  Lab 11/21/17 1710  TROPIPOC 0.34*       Radiology    No results found.   Patient Profile     77 y.o. male with ESRD (refuses dialysis), bladder cancer s/p urostomy (chronic bleeding requiring transfusion), HTN, NCB with NSTEMI.  Assessment & Plan    1 non-ST elevation myocardial infarction- - ECG today still w/ deep T wave inversions - plan pain management  w/ Ativan and fentanyl - continue ASA, BB, Imdur - no heparin due to bleeding from urostomy - pt has refused HD>>no cath - continue med rx - IM planning Palliative Care consult>>agree - DNR  2 end-stage renal disease- - he still refuses HD  3 no CODE BLUE  4 Hematuria w/ chronic blood loss - if this worsens on ASA>>will have to stop it  Generally poor prognosis. Dr Stanford Breed spoke w/ [name] (dtr) on 01/29 at pt's request  Cards will see again prn, but do not feel we have much to offer.   For questions or updates, please contact [place] Please consult [email or web address] for contact info under Cardiology/STEMI.      Signed, Rosaria Ferries, PA-C  11/23/2017, 7:22 AM   As above, patient seen and examined.  Patient complains of recurrent chest pain.  It is improving following morphine.  I again discussed options with him.  He is very clear that he would never consider dialysis.  Given severity of baseline  renal insufficiency any contrast would lead to dialysis.  Therefore cardiac catheterization is not an option.  He also has bleeding from his ostomy (and requires periodic transfusion) and even if he would agree to catheterization PCI may not be advisable as it would require dual anti-platelet therapy.  Patient again expresses desire for no CODE BLUE.  He does not want intubation, defibrillation or CPR.  Therefore would continue aspirin, nitrates and metoprolol.  We have not added IV heparin due to bleeding from urostomy.  We cannot advance cardiac medications as blood pressure is borderline.  Would treat with morphine as needed.  Would ask palliative care to evaluate.  His prognosis is poor.  We do not have much to offer at this point.  Please call with questions. Kirk Ruths, MD

## 2017-11-23 NOTE — Progress Notes (Addendum)
PROGRESS NOTE   Matthew Mays  QQV:[id number]    DOB: 02-Mar-1941    DOA: 11/21/2017  PCP: Clovia Cuff, MD   I have briefly reviewed patients previous medical [facility] in [facility].  Brief Narrative:  Matthew Mays a 77 y.o.malewith medical history significant ESRD not on HD, CAD, HTN, and bladder cancer s/p urostomy with persistent/recurrent hematuria; who presented with complaints of chest pain and diagnosednon-ST elevation myocardial.  Seen by Cardiology and now onaspirin, beta-blocker and isosorbide. Not on heparin because of bleeding from his Urostomy. Per Cardiology, not a candidate for cardiac catheterization due to certainty of contrast nephropathy following procedure. He also refuses cardiac catheterization.  Patient also states that he Recently fired Dr. Florene Glen was his nephrologist, as he kept insisting on starting hemodialysis. Patient also states that he has been transfusion dependent for the last several months because of his ongoing hematuria. He is a no CODE BLUE. He wants only medical therapy.  Palliative care medicine consulted.     Assessment & Plan:   Principal Problem:   NSTEMI (non-ST elevated myocardial infarction) ([place]) Active Problems:   ESRD (end stage renal disease) ([place])   Leukocytosis   Anemia due to chronic blood loss   Hematuria   NSTEMI:  - Patient presented with complaints of chest pain and initial troponin I 0.89>7.59>7.19 .  -No heparin added because of hematuria from his urostomy. -Cardiology was consulted, discussed with Dr. Stanford Breed.  Cardiac catheterization not an option because it will certainly drive him to HD which he does not want and he may require PCI and DAPT which cannot be given due to hematuria. -Continue aspirin, nitrates and metoprolol.  Medications cannot be advanced due to blood pressure limitations/hypotension. -Added morphine for pain 1/30. -Palliative care medicine consulted for goals of care and possible  hospice. -Extremely poor prognosis.  Leukocytosis: -Possibly stress response.  No clear focus of infection.  Stable.  ESRD: - Hecontinues torefuse to be on hemodialysiseven if it means possible death.Patient found to have BUN of 153 with creatinine 8.93 on admission.  - Continue sodium bicarb -HD was discussed again 1/30 and he declined.  Metabolic acidosis: - suspect uremia is likely cause of elevated anion gap -Patient not clinically encephalopathic and is quite coherent and able to make his decisions known very clearly and coherently.  Hyperglycemia: - Patient's initial glucose noted to be 271. Noprevious history of diabetes. Last hemoglobin A1c noted to be 5.7 on 09/20/2017. - Mildly fluctuating but reasonable inpatient control.  Anemiadue tochronic blood loss:  - Hemoglobin noted to be 9.4 on admission.  - Follow CBC in a.m. and transfuse if hemoglobin <9 g per DL.  Hematuria: Chronic. - PT/INR within normal limits. -Strict intake and output -If hematuria worsens then may have to stop aspirin.  Essential hypertension -continue metoprolol and isosorbide mononitrate.  Noted to have soft blood pressures.   DVT prophylaxis: SCDs.  Discontinued subcutaneous heparin due to hematuria. Code Status: DNR Family Communication: None at bedside Disposition: To be determined.?  Home with hospice versus hospice home.   Consultants:  Cardiology-signed off 1/30 Palliative care medicine-input pending.  Procedures:  None  Antimicrobials:  None   Subjective: Overnight events noted.  Reports that he had chest pain since 2 AM up to this morning.  It resolved after pain medications.  Reports that he does not get nursing attention as quickly as he would like.  Discussed with charge nurse.  Denies any other complaints.  States that he has  had issues with hematuria in his urostomy for almost 10 years.  States that he lives alone, does not use home oxygen,  ambulates with the help of a walker.  ROS: As above.  Objective:  Vitals:   11/22/17 2300 11/23/17 0354 11/23/17 0655 11/23/17 1459  BP: (!) 99/56 (!) 94/54 (!) 83/56 (!) 87/50  Pulse: 93 (!) 106  87  Resp: 19 (!) 21    Temp:  (!) 97.5 F (36.4 C)  (!) 97.4 F (36.3 C)  TempSrc:  Oral  Oral  SpO2:  95% 93% 100%  Weight:  75.6 kg (166 lb 10.7 oz)    Height:        Examination:  General exam: Elderly male, moderately built and frail, chronically ill looking, lying comfortably propped up in bed. Respiratory system: Clear to auscultation. Respiratory effort normal. Cardiovascular system: S1 & S2 heard, RRR. No JVD, murmurs, rubs, gallops or clicks. No pedal edema.  Telemetry personally reviewed: Sinus rhythm with BBB morphology. Gastrointestinal system: Abdomen is nondistended, soft and nontender. No organomegaly or masses felt. Normal bowel sounds heard.  Right lower quadrant Urostomy with bloody fluid. Central nervous system: Alert and oriented. No focal neurological deficits. Extremities: Symmetric 5 x 5 power. Skin: No rashes, lesions or ulcers Psychiatry: Judgement and insight appear normal. Mood & affect appropriate.     Data Reviewed: I have personally reviewed following labs and imaging studies  CBC: Recent Labs  Lab 11/17/17 0647 11/21/17 0406 11/21/17 1626 11/21/17 1841  WBC 8.5 3.7* 10.7* 11.0*  NEUTROABS  --  2.8 10.2* 9.4*  HGB 10.6* 17.2* 8.8* 9.4*  HCT 30.4* 50.8 26.4* 28.1*  MCV 88.1 90.7 92.3 92.1  PLT 105* 67* 177 734   Basic Metabolic Panel: Recent Labs  Lab 11/17/17 0647 11/21/17 0406 11/21/17 1626 11/22/17 0428 11/23/17 0436  NA 136 138 134* 136 133*  K 4.9 5.2* 5.3* 6.4* 5.0  CL 106 102 99* 104 104  CO2 12* 14* 13* 12* 10*  GLUCOSE 100* 93 271* 144* 243*  BUN 144* 147* 153* 159* 153*  CREATININE 8.75* 9.00* 8.93* 9.06* 8.59*  CALCIUM 7.5* 8.2* 7.9* 7.5* 6.9*   Coagulation Profile: Recent Labs  Lab 11/21/17 0406 11/21/17 1833    INR 1.04 0.95   Cardiac Enzymes: Recent Labs  Lab 11/21/17 0406 11/21/17 1832 11/21/17 2308 11/22/17 0051 11/22/17 0428  TROPONINI 0.06* 1.83* 7.59* 7.19* 6.20*    CBG: Recent Labs  Lab 11/22/17 1247 11/22/17 1636 11/22/17 2003 11/23/17 0804 11/23/17 1105  GLUCAP 188* 127* 157* 264* 147*    Recent Results (from the past 240 hour(s))  Urine culture     Status: Abnormal   Collection Time: 11/16/17  5:38 AM  Result Value Ref Range Status   Specimen Description URINE, CATHETERIZED  Final   Special Requests NONE  Final   Culture <10,000 COLONIES/mL INSIGNIFICANT GROWTH (A)  Final   Report Status 11/17/2017 FINAL  Final  Urine culture     Status: Abnormal   Collection Time: 11/21/17  4:10 AM  Result Value Ref Range Status   Specimen Description URINE, RANDOM  Final   Special Requests NONE  Final   Culture MULTIPLE SPECIES PRESENT, SUGGEST RECOLLECTION (A)  Final   Report Status 11/22/2017 FINAL  Final  MRSA PCR Screening     Status: None   Collection Time: 11/22/17  4:30 PM  Result Value Ref Range Status   MRSA by PCR NEGATIVE NEGATIVE Final    Comment:  The GeneXpert MRSA Assay (FDA approved for NASAL specimens only), is one component of a comprehensive MRSA colonization surveillance program. It is not intended to diagnose MRSA infection nor to guide or monitor treatment for MRSA infections.          Radiology Studies: No results found.      Scheduled Meds: . aspirin  324 mg Oral Daily  . calcium carbonate  3 tablet Oral BID  . dorzolamide  1 drop Right Eye BID  . ferrous sulfate  325 mg Oral BID WC  . furosemide  80 mg Oral Daily  . heparin  5,000 Units Subcutaneous Q8H  . insulin aspart  0-5 Units Subcutaneous QHS  . insulin aspart  0-9 Units Subcutaneous TID WC  . isosorbide mononitrate  90 mg Oral Daily  . latanoprost  1 drop Both Eyes QHS  . metoprolol tartrate  100 mg Oral BID  . sodium bicarbonate  650 mg Oral BID   Continuous  Infusions:   LOS: 1 day     Vernell Leep, MD, FACP, [facility]. [facility] Pager [phone]  If 7PM-7AM, please contact night-coverage [email or web address] Password [facility] 11/23/2017, 3:27 PM

## 2017-11-23 NOTE — Consult Note (Addendum)
   [facility] Inpatient Consult   11/23/2017  Matthew Mays 02-13-1941 [id number]    Patient screened for potential [facility] Care Management program due to multiple hospitalizations.   Chart reviewed. Primary Care MD is not a [facility] Provider. Noted patient is not undergoing HD either. Does not appear he sees [facility] nephrologist.  Spoke with inpatient RNCM. Palliative medicine team following. Noted prognosis is poor.   Marthenia Rolling, MSN-Ed, RN,BSN [facility] Liaison [phone]

## 2017-11-23 NOTE — Progress Notes (Signed)
Patient called complaining of 4/10 midsternal chest pain.  BP 83/56 MAP 66, Cardiology PA paged for further instruction.  This RN updated primary RN.

## 2017-11-24 LAB — CBC
HEMATOCRIT: 31.9 % — AB (ref 39.0–52.0)
HEMOGLOBIN: 10.8 g/dL — AB (ref 13.0–17.0)
MCH: 31.3 pg (ref 26.0–34.0)
MCHC: 33.9 g/dL (ref 30.0–36.0)
MCV: 92.5 fL (ref 78.0–100.0)
Platelets: 134 10*3/uL — ABNORMAL LOW (ref 150–400)
RBC: 3.45 MIL/uL — AB (ref 4.22–5.81)
RDW: 16.2 % — ABNORMAL HIGH (ref 11.5–15.5)
WBC: 10 10*3/uL (ref 4.0–10.5)

## 2017-11-24 LAB — GLUCOSE, CAPILLARY
Glucose-Capillary: 108 mg/dL — ABNORMAL HIGH (ref 65–99)
Glucose-Capillary: 110 mg/dL — ABNORMAL HIGH (ref 65–99)

## 2017-11-24 MED ORDER — FUROSEMIDE 80 MG PO TABS: 80.0000 mg | ORAL_TABLET | Freq: Every day | ORAL | 0 refills | Status: AC

## 2017-11-24 MED ORDER — ASPIRIN EC 325 MG PO TBEC: 325.0000 mg | DELAYED_RELEASE_TABLET | Freq: Every day | ORAL | 0 refills | Status: AC

## 2017-11-24 MED ORDER — SENNA 8.6 MG PO TABS: 2.0000 | ORAL_TABLET | Freq: Every day | ORAL | 0 refills | Status: AC | PRN

## 2017-11-24 MED ORDER — SODIUM BICARBONATE 650 MG PO TABS: 1300.0000 mg | ORAL_TABLET | Freq: Two times a day (BID) | ORAL | 0 refills | Status: AC

## 2017-11-24 MED ORDER — POLYETHYLENE GLYCOL 3350 17 G PO PACK: 17.0000 g | PACK | Freq: Every day | ORAL | 0 refills | Status: AC

## 2017-11-24 MED ORDER — ISOSORBIDE MONONITRATE ER 30 MG PO TB24: 90.0000 mg | ORAL_TABLET | Freq: Every day | ORAL | 0 refills | Status: AC

## 2017-11-24 MED ORDER — METOPROLOL TARTRATE 100 MG PO TABS: 100.0000 mg | ORAL_TABLET | Freq: Two times a day (BID) | ORAL | 0 refills | Status: AC

## 2017-11-24 MED ORDER — MORPHINE SULFATE (CONCENTRATE) 10 MG/0.5ML PO SOLN
5.0000 mg | ORAL | 0 refills | Status: AC | PRN
Start: 2017-11-24 — End: ?

## 2017-11-24 MED ORDER — FERROUS SULFATE 325 (65 FE) MG PO TABS: 325.0000 mg | ORAL_TABLET | Freq: Two times a day (BID) | ORAL | 0 refills | Status: AC

## 2017-11-24 NOTE — Discharge Instructions (Signed)
Please get your medications reviewed and adjusted by your Primary MD. ° °Please request your Primary MD to go over all Hospital Tests and Procedure/Radiological results at the follow up, please get all Hospital records sent to your Prim MD by signing hospital release before you go home. ° °If you had Pneumonia of Lung problems at the Hospital: °Please get a 2 view Chest X ray done in 6-8 weeks after hospital discharge or sooner if instructed by your Primary MD. ° °If you have Congestive Heart Failure: °Please call your Cardiologist or Primary MD anytime you have any of the following symptoms:  °1) 3 pound weight gain in 24 hours or 5 pounds in 1 week  °2) shortness of breath, with or without a dry hacking cough  °3) swelling in the hands, feet or stomach  °4) if you have to sleep on extra pillows at night in order to breathe ° °Follow cardiac low salt diet and 1.5 lit/day fluid restriction. ° °If you have diabetes °Accuchecks 4 times/day, Once in AM empty stomach and then before each meal. °Log in all results and show them to your primary doctor at your next visit. °If any glucose reading is under 80 or above 300 call your primary MD immediately. ° °If you have Seizure/Convulsions/Epilepsy: °Please do not drive, operate heavy machinery, participate in activities at heights or participate in high speed sports until you have seen by Primary MD or a Neurologist and advised to do so again. ° °If you had Gastrointestinal Bleeding: °Please ask your Primary MD to check a complete blood count within one week of discharge or at your next visit. Your endoscopic/colonoscopic biopsies that are pending at the time of discharge, will also need to followed by your Primary MD. ° °Get Medicines reviewed and adjusted. °Please take all your medications with you for your next visit with your Primary MD ° °Please request your Primary MD to go over all hospital tests and procedure/radiological results at the follow up, please ask your  Primary MD to get all Hospital records sent to his/her office. ° °If you experience worsening of your admission symptoms, develop shortness of breath, life threatening emergency, suicidal or homicidal thoughts you must seek medical attention immediately by calling 911 or calling your MD immediately  if symptoms less severe. ° °You must read complete instructions/literature along with all the possible adverse reactions/side effects for all the Medicines you take and that have been prescribed to you. Take any new Medicines after you have completely understood and accpet all the possible adverse reactions/side effects.  ° °Do not drive or operate heavy machinery when taking Pain medications.  ° °Do not take more than prescribed Pain, Sleep and Anxiety Medications ° °Special Instructions: If you have smoked or chewed Tobacco  in the last 2 yrs please stop smoking, stop any regular Alcohol  and or any Recreational drug use. ° °Wear Seat belts while driving. ° °Please note °You were cared for by a hospitalist during your hospital stay. If you have any questions about your discharge medications or the care you received while you were in the hospital after you are discharged, you can call the unit and asked to speak with the hospitalist on call if the hospitalist that took care of you is not available. Once you are discharged, your primary care physician will handle any further medical issues. Please note that NO REFILLS for any discharge medications will be authorized once you are discharged, as it is imperative that you   return to your primary care physician (or establish a relationship with a primary care physician if you do not have one) for your aftercare needs so that they can reassess your need for medications and monitor your lab values. ° °You can reach the hospitalist office at phone [phone] or fax [phone] °  °If you do not have a primary care physician, you can call [phone] for a physician  referral. ° °Non-ST Segment Elevation Heart Attack °A heart attack (myocardial infarction) happens when some of the heart muscle is injured or dies because it does not get enough oxygen. A non-ST segment elevation heart attack is a type of heart attack. It happens when the body does not get enough oxygen because an artery carrying blood to the heart muscles (coronary artery) becomes partly or temporarily blocked. This type of heart attack is usually less severe than the type of heart attack in which a coronary artery becomes completely blocked. °What are the causes? °The most common cause of this condition is a blocked coronary artery. A coronary artery can become blocked from a gradual buildup of cholesterol, fat, and plaque. A blood clot can form over the plaque and block blood flow. °What increases the risk? °This condition is more likely to develop in: °· Smokers. °· Males. °· Older adults. °· Overweight and obese adults. °· People with high blood pressure (hypertension), high cholesterol, or diabetes. °· People with a family history of heart disease. °· People who do not get enough exercise. °· People who are under a lot of stress. °· People who drink too much alcohol. °· People who use illegal street drugs that increase the heart rate, such as cocaine and methamphetamines. ° °What are the signs or symptoms? °Symptoms of this condition include: °· Chest pain or a feeling of pressure in the chest. It may feel like something is crushing or squeezing the chest. °· Discomfort in the upper back or in the area between the shoulder blades. °· Upper back pain. °· Tingling in the hands and arms. °· Shortness of breath. °· Heartburn or indigestion. °· Sudden cold sweats. °· Unexplained sweating. °· Sudden lightheadedness. °· Unexplained feelings of nervousness or anxiety. °· Feeling of tiredness, or not feeling well. ° °How is this diagnosed? °This condition is diagnosed based on a person's signs and symptoms and a  physical exam. You may also have tests done, including: °· Blood tests. °· A chest X-ray. °· An test to measure the electrical activity of the heart (electrocardiogram). °· A test that uses sound waves to produce a picture of the heart (echocardiogram). °· A test to look at the heart arteries (coronary angiogram). ° °If you are still having chest pain after 12-24 hours, or if your health care providers think your heart is at risk, you may have a procedure called cardiac catheterization. In this procedure, a long, thin tube is inserted into an artery in your groin and moved up to the arteries in your heart. This procedure helps your health care provider figure out the source of the problem. °How is this treated? °This condition may be treated with: °· Bed rest in the hospital. °· Medicines to relieve chest pain. °· Medicines to protect the heart. °· If you have a blockage, a procedure in which the artery is opened (angioplasty) and a stent is placed to keep the artery open. ° °After initial treatment you may need to take medicine to: °· Keep your blood from clotting too easily. °· Control your blood   pressure. °· Lower your cholesterol. °· Control abnormal heart rhythms (arrhythmias). ° °Follow these instructions at home: °· Take medicines only as directed by your health care provider. °· Do not take the following medicines unless your health care provider approves: °? Nonsteroidal anti-inflammatory drugs (NSAIDs), such as ibuprofen, naproxen, or celecoxib. °? Vitamin supplements that contain vitamin A, vitamin E, or both. °? Hormone replacement therapy that contains estrogen with or without progestin. °· Make lifestyle changes as directed by your health care provider. These may include: °? Using no tobacco products, including cigarettes, chewing tobacco, and electronic cigarettes. If you are struggling to quit, ask your health care provider for help. °? Exercising as directed by your health care provider. Ask for a  list of activities that are safe for you. °? Eating a heart-healthy diet. Work with a registered dietitian to learn healthy eating options. °? Maintaining a healthy weight. °? Managing other medical conditions, like diabetes. °? Reducing stress. °? Limiting how much alcohol you drink as directed by your health care provider. °Get help right away if: °· You have any symptoms of this condition. °This information is not intended to replace advice given to you by your health care provider. Make sure you discuss any questions you have with your health care provider. °Document Released: 05/10/2005 Document Revised: 03/18/2016 Document Reviewed: 09/18/2014 °Elsevier Interactive Patient Education © 2018 [employer] ° °

## 2017-11-24 NOTE — Care Management Note (Signed)
Case Management Note  Patient Details  Name: Kais Monje Mcdanel MRN: [id number] Date of Birth: 05-Jul-1941  Subjective/Objective: Pt presented for Chest Pain-  N-Stemi. Hx of ESRD, CAD and Bladder Cancer. PTA from home with the support of friends. Plan will be to return home with Hospice Services via [facility]. Per pt he has DME rw, 3n1, shower chair, wheelchair and hospital bed. No Oxygen needs at this time. Per pt he has transportation home via car.                   Action/Plan: CM did discuss Hospice Choice and he has utilized [facility]. He wants to continue to work with agency. CM did make referral with Liaison Bambi and SOC to begin within 24-48 hours post d/c. Pt uses [facility] [place] and [place] that delivered to patient. [place] [facility] so pt will need to use [facility] for medications.    Expected Discharge Date:                  Expected Discharge Plan:  Home w Hospice Care  In-House Referral:  NA  Discharge planning Services  CM Consult  Post Acute Care Choice:  Hospice Choice offered to:  Patient  DME Arranged:  N/A DME Agency:  NA  HH Arranged:  RN [place] Agency:  [place]  Status of Service:  Completed, signed off  If discussed at [place] of Stay Meetings, dates discussed:    Additional Comments:  Bethena Roys, RN 11/24/2017, 11:04 AM

## 2017-11-24 NOTE — Progress Notes (Signed)
Palliative Care Daily Progress Note   S: Medical record reviewed, followed up with patient and Dr. Algis Liming regarding patient's condition and disposition.  B: Code Status: DNR  Family/Support: Daughters have been in contact via phone and his caregivers/neighbors Manuela Schwartz and Corbin Ade have been in contact and awaiting for patient to contact him once he is ready to be discharged home to their 24 hr care.    A: Patient's current condition is stable for transport home via private vehicle. We have reviewed his current plan once he returns home. Patient verbalizes understanding of the need for 24 hour care in addition to his requested Hospice in home care [facility]). Case Management has been in contact with Bambi to make sure these services are set-up and have appropriate contact for patient upon arrival back to his home.  Patient Symptom Assessment Flowsheet has been completed.  Family is onboard with him returning home and his daughters have been in contact with the [facility] family to coordinate his home care needs.  Discharge Planning: Patient verbalized understanding of contacting Dr. Earnestine Mealing once he returns home to coordinate his home medical needs in collaboration with his hospice services.   R: Patient verbalized to Palliative Care Team he is satisfied with his disposition decisions with hospice and appreciates our provided services during his hospitalization. Again he has private caregivers of his choice that has been verified and will be caring for him 24/7 Manuela Schwartz & Corbin Ade) through their homecare services.   Time In: 0800 Time Out:0835 Total Time: 8 Min.  Plan was discussed with Dr. Lavella Lemons and bedside nurse.   Greater than 50%  of this time was spent counseling and coordinating care related to the above assessment and plan.   Athena "Alda Lea, [place] Palliative Medicine Team  Phone: [phone] Fax: [phone]  This NP was present for above assessment  and agree     Wadie Lessen NP

## 2017-11-24 NOTE — Progress Notes (Signed)
Patient received discharge information and acknowledged understanding of it. Patient IV was removed. Per patient, legal guardian aware that patient is being discharged today.

## 2017-11-24 NOTE — Discharge Summary (Signed)
Physician Discharge Summary  Matthew Mays CZY:[id number] DOB: October 15, 1941  PCP: Matthew Cuff, MD  Admit date: 11/21/2017 Discharge date: 11/24/2017  Recommendations for Outpatient Follow-up:  1. Matthew Mays, PCP in 5 days with repeat labs (CBC & Renal panel). 2. May consider outpatient Urology consultation for evaluation and management of hematuria.  Home Health: Patient is being discharged with community Home care and hospice. Equipment/Devices: As per hospice.  Discharge Condition: Improved.  However his overall condition is extremely guarded with poor prognosis with and at risk for rapid decline including death.  Patient and his daughter have been made aware of his situation. CODE STATUS: DNR Diet recommendation: Heart healthy diet.  Discharge Diagnoses:  Principal Problem:   NSTEMI (non-ST elevated myocardial infarction) ([place]) Active Problems:   ESRD (end stage renal disease) ([place])   Leukocytosis   Anemia due to chronic blood loss   Hematuria   DNR (do not resuscitate)   Brief Summary: Matthew Mays a 77 y.o.malewith medical history significant ESRD not on HD, CAD, HTN, and bladder cancer s/p urostomy with persistent/recurrent hematuria; who presented with complaints of chest pain and diagnosednon-ST elevation myocardial.  Seen by Cardiology and now onaspirin, beta-blocker and isosorbide. Not on heparin because of bleeding from his Urostomy. Per Cardiology, not a candidate for cardiac catheterization due to certainty of contrast nephropathy following procedure. He also refuses cardiac catheterization.  Patient also states that he Recently fired Matthew Mays was his nephrologist, as he kept insisting on starting hemodialysis. Patient also states that he has been transfusion dependent for the last several months because of his ongoing hematuria. He is a no CODE BLUE. He wants only medical therapy.  Palliative care medicine consulted.     Assessment & Plan:    NSTEMI:  - Patient presented with complaints of chest pain and initial troponin I 0.89>7.59>7.19.  -No heparin added because of hematuria from his urostomy. -Cardiology was consulted.  Cardiac catheterization not an option because it will certainly drive him to HD which he does not want and he may require PCI and DAPT which cannot be given due to hematuria. -Continue aspirin, nitrates and metoprolol.  Medications cannot be advanced due to blood pressure limitations/hypotension. -Added morphine for pain 1/30. -Extremely poor prognosis. -Reports that this chest pain is better controlled with pain medications. -Palliative care team evaluated the patient.  I discussed with them.  At this time patient wishes to continue current plan of care and treat the treatable.  They assisted with arranging home hospice at discharge.  Leukocytosis: -Possibly stress response.  No clear focus of infection.  Stable.  ESRD: - Hecontinues torefuse to be on hemodialysiseven if it means possible death.Patient found to have BUN of 153 with creatinine 8.93 on admission.  - Continue sodium bicarb, increased to 1300 mg twice daily at discharge. - HD was discussed again 1/30 and he declined.  Metabolic acidosis: - suspect uremia is likely cause of elevated anion gap -Patient not clinically encephalopathic and is quite coherent and able to make his decisions known very clearly and coherently.  Hyperglycemia: - Patient's initial glucose noted to be 271. Noprevious history of diabetes. Last hemoglobin A1c noted to be 5.7 on 09/20/2017. - Mildly fluctuating but reasonable inpatient control.  Anemiadue tochronic blood loss:  - Hemoglobin noted to be 9.4 on admission.  This dropped down to 7.7 on 1/30.  He was transfused 2 units of PRBCs and his hemoglobin has increased to 10.8. -Patient wishes to continue to have periodic  lab work done as outpatient and blood transfusions as needed.  He has been  advised to discuss with his home hospice team as well as his PCP regarding lab draws and possibly go to the cancer center for transfusion needs.  He verbalized understanding. - Follow CBC transfuse if hemoglobin <9 g per DL.  Hematuria: Intermittent and chronic. - PT/INR within normal limits. -Strict intake and output -Patient reported that he has had intermittent hematuria for several years and started back again about 2 weeks ago.  Etiology of hematuria apparently unclear and reportedly has had extensive workup.  He has history of bladder cancer. -May consider outpatient urology consultation to further evaluate as deemed necessary.  Essential hypertension -continue metoprolol and isosorbide mononitrate.  Noted to have soft blood pressures.  Thrombocytopenia -?  Related to blood transfusion.  Follow CBCs in a few days as outpatient.   Consultants:  Cardiology-signed off 1/30 Palliative care medicine-input pending.  Procedures:  None   Discharge Instructions  Discharge Instructions    Call MD for:  difficulty breathing, headache or visual disturbances   Complete by:  As directed    Call MD for:  extreme fatigue   Complete by:  As directed    Call MD for:  persistant dizziness or light-headedness   Complete by:  As directed    Call MD for:  severe uncontrolled pain   Complete by:  As directed    Diet - low sodium heart healthy   Complete by:  As directed    Increase activity slowly   Complete by:  As directed        Medication List    TAKE these medications   aspirin EC 325 MG tablet Take 1 tablet (325 mg total) by mouth daily.   calcium carbonate 750 MG chewable tablet Commonly known as:  TUMS EX Chew 3 tablets by mouth 2 (two) times daily. LUNCH and SUPPER   dorzolamide 2 % ophthalmic solution Commonly known as:  TRUSOPT Place 1 drop into the right eye 2 (two) times daily.   ferrous sulfate 325 (65 FE) MG tablet Commonly known as:  IRON  SUPPLEMENT Take 1 tablet (325 mg total) by mouth 2 (two) times daily with a meal. LUNCH and SUPPER   furosemide 80 MG tablet Commonly known as:  LASIX Take 1 tablet (80 mg total) by mouth daily.   isosorbide mononitrate 30 MG 24 hr tablet Commonly known as:  IMDUR Take 3 tablets (90 mg total) by mouth daily.   latanoprost 0.005 % ophthalmic solution Commonly known as:  XALATAN Place 1 drop into both eyes at bedtime.   metoprolol tartrate 100 MG tablet Commonly known as:  LOPRESSOR Take 1 tablet (100 mg total) by mouth 2 (two) times daily. What changed:  additional instructions   morphine CONCENTRATE 10 MG/0.5ML Soln concentrated solution Take 0.25 mLs (5 mg total) by mouth every 4 (four) hours as needed for moderate pain, severe pain, anxiety or shortness of breath.   polyethylene glycol packet Commonly known as:  MIRALAX Take 17 g by mouth daily.   senna 8.6 MG Tabs tablet Commonly known as:  SENOKOT Take 2 tablets (17.2 mg total) by mouth daily as needed for mild constipation or moderate constipation.   sodium bicarbonate 650 MG tablet Take 2 tablets (1,300 mg total) by mouth 2 (two) times daily. What changed:  how much to take      Follow-up Information    Matthew Cuff, MD. Schedule an appointment as soon  as possible for a visit in 5 day(s).   Specialty:  Internal Medicine Why:  To be seen with repeat labs (CBC & Renal panel). Contact information: [address] [place] [address] [phone]          No Known Allergies    Procedures/Studies: Ct Abdomen Pelvis Wo Contrast  Result Date: 11/16/2017 CLINICAL DATA:  Recent kidney infection. Gross hematuria. History of prostate cancer. EXAM: CT ABDOMEN AND PELVIS WITHOUT CONTRAST TECHNIQUE: Multidetector CT imaging of the abdomen and pelvis was performed following the standard protocol without IV contrast. COMPARISON:  CT 07/21/2012 FINDINGS: Lower chest: No acute abnormality. Dense coronary artery and  valvular calcifications. Calcifications within the left ventricle likely involving the papillary muscles and may be related to prior infarct. No effusions. Hepatobiliary: No focal hepatic abnormality. Gallbladder unremarkable. Pancreas: No focal abnormality or ductal dilatation. Spleen: No focal abnormality.  Normal size. Adrenals/Urinary Tract: Bilateral renal atrophy and cortical thinning. Numerous low-density lesions within the kidneys bilaterally, likely small cysts. High-density material noted within the right renal collecting system, pelvis and ureter, likely hematuria. No hydronephrosis. Right lower quadrant ileal conduit noted. Prior cystectomy. Adrenal glands unremarkable. Stomach/Bowel: Sigmoid and descending colonic diverticulosis. No active diverticulitis. Stomach and small bowel decompressed, unremarkable. Vascular/Lymphatic: Diffuse aortic, iliac, and branch vessel calcifications. No adenopathy. Reproductive: No visible focal abnormality. Other: No free fluid or free air. Musculoskeletal: No acute bony abnormality. IMPRESSION: Right lower quadrant ileal conduit noted. There is no hydronephrosis. High-density urine noted within the right renal collecting system and renal pelvis as well as the right ureter compatible with hematuria. Exact cause not visualized. No visible stones. Extensive renovascular calcifications. Sigmoid diverticulosis.  No active diverticulitis. Severe arterial atherosclerosis. Electronically Signed   By: Rolm Baptise M.D.   On: 11/16/2017 09:26   Dg Chest 2 View  Result Date: 11/21/2017 CLINICAL DATA:  Mid chest pain tonight. EXAM: CHEST  2 VIEW COMPARISON:  11/16/2017 FINDINGS: Mild hyperinflation. Normal heart size and pulmonary vascularity. No focal airspace disease or consolidation in the lungs. No blunting of costophrenic angles. No pneumothorax. Mediastinal contours appear intact. Diffuse calcification of the aorta. IMPRESSION: No evidence of active pulmonary disease.  Hyperinflation suggesting emphysema. Prominent aortic atherosclerosis. Electronically Signed   By: Lucienne Capers M.D.   On: 11/21/2017 04:32   Dg Chest 2 View  Result Date: 11/16/2017 CLINICAL DATA:  Initial evaluation for acute upper chest pain. EXAM: CHEST  2 VIEW COMPARISON:  Prior radiograph from 11/11/2017. FINDINGS: Mild cardiomegaly, stable. Mediastinal silhouette within normal limits. Extensive aortic atherosclerosis again noted. Extensive peripheral calcifications noted as well. Lungs normally inflated. Mild bibasilar scattered bibasilar scarring and/or fibrosis. No focal infiltrates. No pulmonary edema or pleural effusion. No pneumothorax. No acute osseous abnormality.  Osteopenia noted. IMPRESSION: 1. Stable appearance of the chest with no active cardiopulmonary disease identified. 2. Extensive calcific aortic atherosclerosis. Electronically Signed   By: Jeannine Boga M.D.   On: 11/16/2017 05:25   Dg Chest 2 View  Result Date: 11/11/2017 CLINICAL DATA:  Chest pain EXAM: CHEST  2 VIEW COMPARISON:  November 04, 2017 FINDINGS: There is slight scarring in the lung bases. There is no edema or consolidation. The heart size and pulmonary vascularity are normal. No adenopathy. There is aortic atherosclerosis. There is extensive brachial and axillary artery calcification bilaterally. There is degenerative change in the thoracic spine. IMPRESSION: Extensive aortic atherosclerosis. Peripheral arterial vascular calcification bilaterally also noted. No edema or consolidation.  Mild bibasilar scarring. Aortic Atherosclerosis (ICD10-I70.0). Electronically Signed  By: Lowella Grip III M.D.   On: 11/11/2017 11:32   Dg Chest 2 View  Result Date: 11/04/2017 CLINICAL DATA:  Chest pain. EXAM: CHEST  2 VIEW COMPARISON:  And 10/21/2017. FINDINGS: Mediastinum and hilar structures normal. Mild cardiomegaly with normal pulmonary vascularity. No focal infiltrate. Mild left base subsegmental atelectasis.  Vascular calcification noted. IMPRESSION: Low lung volumes with mild basilar atelectasis. No acute cardiopulmonary disease. Electronically Signed   By: Marcello Moores  Register   On: 11/04/2017 13:22      Subjective: Patient reports that pain medications did help his chest pain which is much better controlled.  No chest pain reported this morning.  Denies dyspnea.  Status post 2 units PRBCs yesterday.  Discharge Exam:  Vitals:   11/24/17 0241 11/24/17 0355 11/24/17 0403 11/24/17 1239  BP: (!) 90/54 (!) 100/59 (!) 95/56 (!) 108/59  Pulse: 95 95 96 (!) 101  Resp: (!) 24 (!) 23 (!) 23   Temp: (!) 97.4 F (36.3 C)  (!) 97.3 F (36.3 C)   TempSrc: Oral  Oral   SpO2: 95% 95% 94%   Weight:   78.6 kg (173 lb 4.5 oz)   Height:        General exam: Elderly male, moderately built and frail, chronically ill looking, lying comfortably propped up in bed.  Does not appear in any distress. Respiratory system: Clear to auscultation. Respiratory effort normal. Cardiovascular system: S1 & S2 heard, RRR. No JVD, murmurs, rubs, gallops or clicks. No pedal edema.  Telemetry personally reviewed: Sinus rhythm with BBB morphology - ST low 100s.. Gastrointestinal system: Abdomen is nondistended, soft and nontender. No organomegaly or masses felt. Normal bowel sounds heard.  Right lower quadrant Urostomy with bloody fluid. Central nervous system: Alert and oriented. No focal neurological deficits. Extremities: Symmetric 5 x 5 power. Skin: No rashes, lesions or ulcers Psychiatry: Judgement and insight appear normal. Mood & affect appropriate.       The results of significant diagnostics from this hospitalization (including imaging, microbiology, ancillary and laboratory) are listed below for reference.     Microbiology: Recent Results (from the past 240 hour(s))  Urine culture     Status: Abnormal   Collection Time: 11/16/17  5:38 AM  Result Value Ref Range Status   Specimen Description URINE, CATHETERIZED   Final   Special Requests NONE  Final   Culture <10,000 COLONIES/mL INSIGNIFICANT GROWTH (A)  Final   Report Status 11/17/2017 FINAL  Final  Urine culture     Status: Abnormal   Collection Time: 11/21/17  4:10 AM  Result Value Ref Range Status   Specimen Description URINE, RANDOM  Final   Special Requests NONE  Final   Culture MULTIPLE SPECIES PRESENT, SUGGEST RECOLLECTION (A)  Final   Report Status 11/22/2017 FINAL  Final  MRSA PCR Screening     Status: None   Collection Time: 11/22/17  4:30 PM  Result Value Ref Range Status   MRSA by PCR NEGATIVE NEGATIVE Final    Comment:        The GeneXpert MRSA Assay (FDA approved for NASAL specimens only), is one component of a comprehensive MRSA colonization surveillance program. It is not intended to diagnose MRSA infection nor to guide or monitor treatment for MRSA infections.      Labs: CBC: Recent Labs  Lab 11/21/17 0406 11/21/17 1626 11/21/17 1841 11/23/17 1541 11/24/17 0511  WBC 3.7* 10.7* 11.0* 10.3 10.0  NEUTROABS 2.8 10.2* 9.4*  --   --   HGB  17.2* 8.8* 9.4* 7.7* 10.8*  HCT 50.8 26.4* 28.1* 23.4* 31.9*  MCV 90.7 92.3 92.1 91.8 92.5  PLT 67* 177 173 177 102*   Basic Metabolic Panel: Recent Labs  Lab 11/21/17 0406 11/21/17 1626 11/22/17 0428 11/23/17 0436  NA 138 134* 136 133*  K 5.2* 5.3* 6.4* 5.0  CL 102 99* 104 104  CO2 14* 13* 12* 10*  GLUCOSE 93 271* 144* 243*  BUN 147* 153* 159* 153*  CREATININE 9.00* 8.93* 9.06* 8.59*  CALCIUM 8.2* 7.9* 7.5* 6.9*    BNP (last 3 results) Recent Labs    03/16/17 0259 07/21/17 1416  BNP 680.7* 761.6*   Cardiac Enzymes: Recent Labs  Lab 11/21/17 0406 11/21/17 1832 11/21/17 2308 11/22/17 0051 11/22/17 0428  TROPONINI 0.06* 1.83* 7.59* 7.19* 6.20*   CBG: Recent Labs  Lab 11/23/17 1105 11/23/17 1556 11/23/17 2128 11/24/17 0730 11/24/17 1126  GLUCAP 147* 86 101* 108* 110*   Urinalysis    Component Value Date/Time   COLORURINE RED (A) 11/21/2017  0410   APPEARANCEUR TURBID (A) 11/21/2017 0410   LABSPEC  11/21/2017 0410    TEST NOT REPORTED DUE TO COLOR INTERFERENCE OF URINE PIGMENT   PHURINE  11/21/2017 0410    TEST NOT REPORTED DUE TO COLOR INTERFERENCE OF URINE PIGMENT   GLUCOSEU (A) 11/21/2017 0410    TEST NOT REPORTED DUE TO COLOR INTERFERENCE OF URINE PIGMENT   HGBUR (A) 11/21/2017 0410    TEST NOT REPORTED DUE TO COLOR INTERFERENCE OF URINE PIGMENT   BILIRUBINUR (A) 11/21/2017 0410    TEST NOT REPORTED DUE TO COLOR INTERFERENCE OF URINE PIGMENT   KETONESUR (A) 11/21/2017 0410    TEST NOT REPORTED DUE TO COLOR INTERFERENCE OF URINE PIGMENT   PROTEINUR (A) 11/21/2017 0410    TEST NOT REPORTED DUE TO COLOR INTERFERENCE OF URINE PIGMENT   NITRITE (A) 11/21/2017 0410    TEST NOT REPORTED DUE TO COLOR INTERFERENCE OF URINE PIGMENT   LEUKOCYTESUR (A) 11/21/2017 0410    TEST NOT REPORTED DUE TO COLOR INTERFERENCE OF URINE PIGMENT   Discussed in detail with patient's daughter/legal guardian.  Updated care and answered questions.   Time coordinating discharge: Over 30 minutes  SIGNED:  Vernell Leep, MD, FACP, [facility]. [facility] Pager [phone]  If 7PM-7AM, please contact night-coverage [email or web address] Password [email or web address] 11/24/2017, 1:35 PM

## 2017-11-25 LAB — TYPE AND SCREEN
ABO/RH(D): A POS
Antibody Screen: NEGATIVE
Unit division: 0
Unit division: 0

## 2017-11-25 LAB — BPAM RBC
BLOOD PRODUCT EXPIRATION DATE: 201902142359
Blood Product Expiration Date: 201902132359
ISSUE DATE / TIME: 201901302134
ISSUE DATE / TIME: 201901310212
UNIT TYPE AND RH: 6200
Unit Type and Rh: 6200

## 2017-11-26 DIAGNOSIS — C675 Malignant neoplasm of bladder neck: Secondary | ICD-10-CM | POA: Diagnosis not present

## 2017-11-26 DIAGNOSIS — N186 End stage renal disease: Secondary | ICD-10-CM | POA: Diagnosis not present

## 2017-12-23 DEATH — deceased

## 2018-09-03 IMAGING — DX DG CHEST 2V
3 series · 3 of 3 positions shown · non-contrast
Comparison: 09/20/2017 chest radiograph

CLINICAL DATA: 76 y/o  M; chest pain and shortness of breath.

EXAM:
CHEST  2 VIEW

[chest lat]
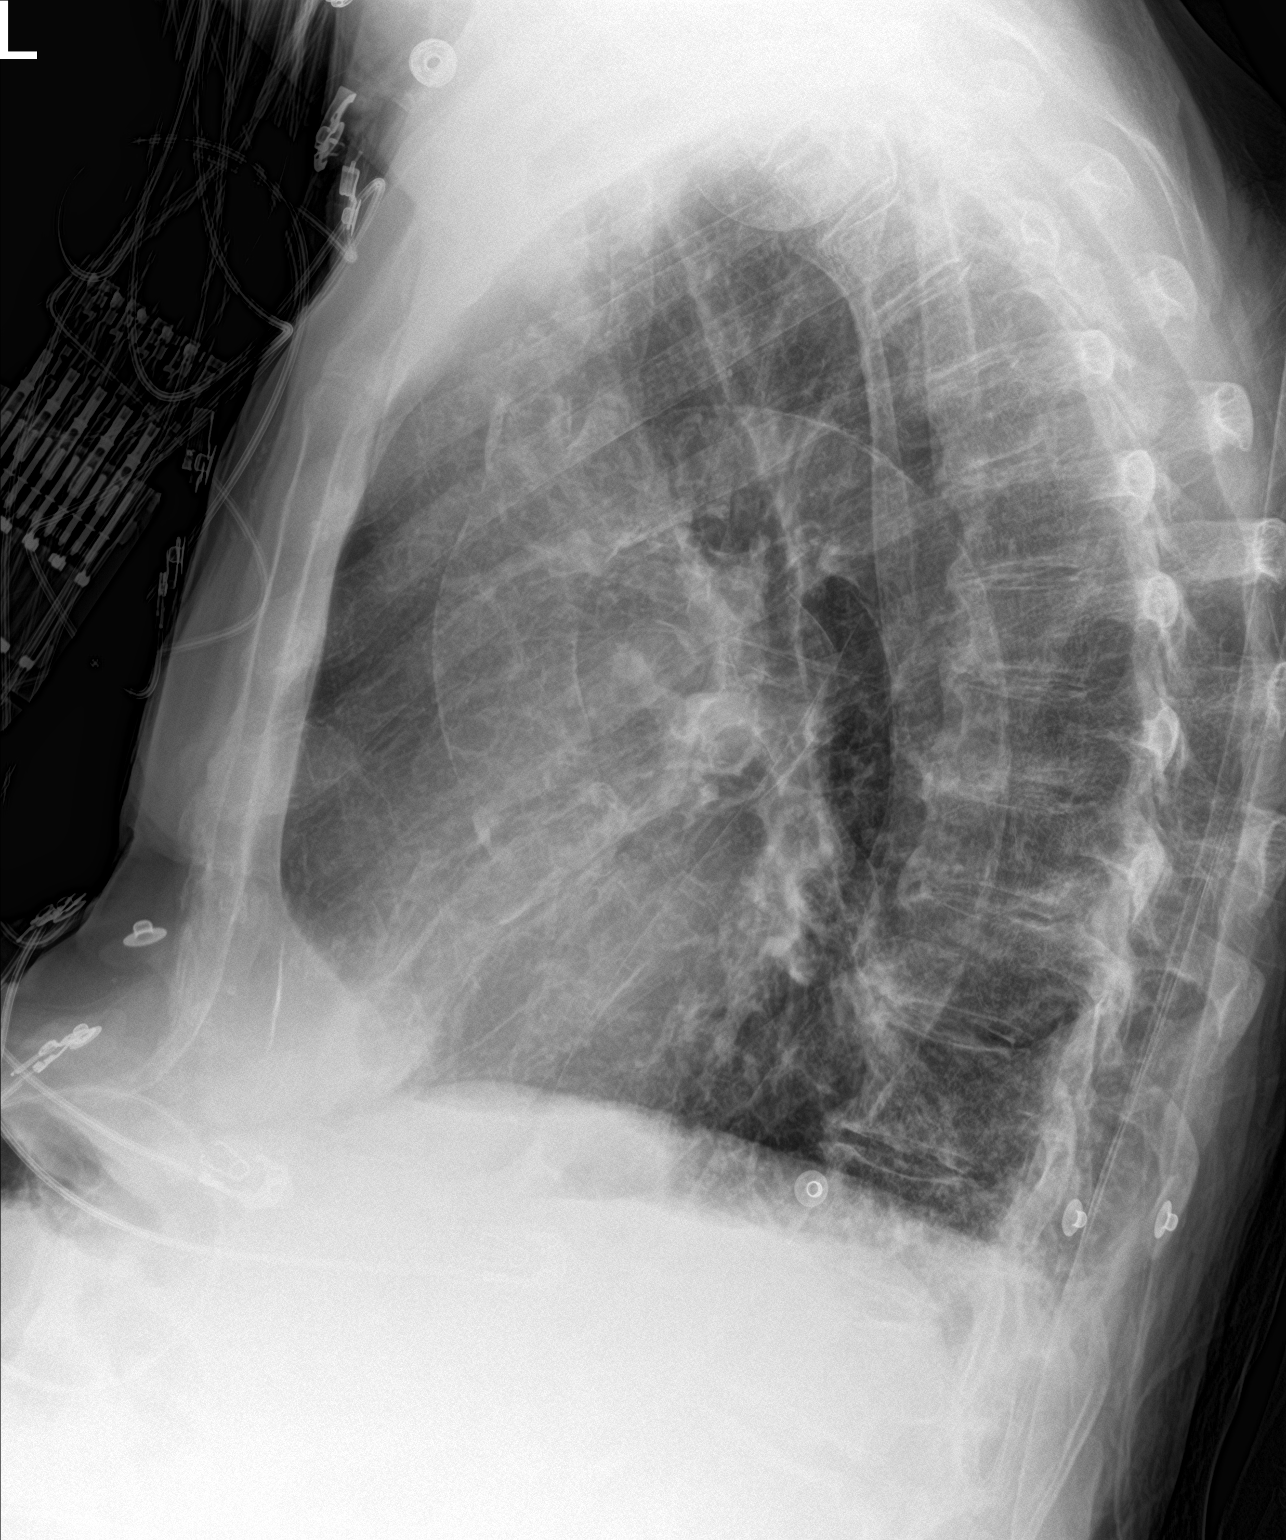

[chest ap (1 of 2)]
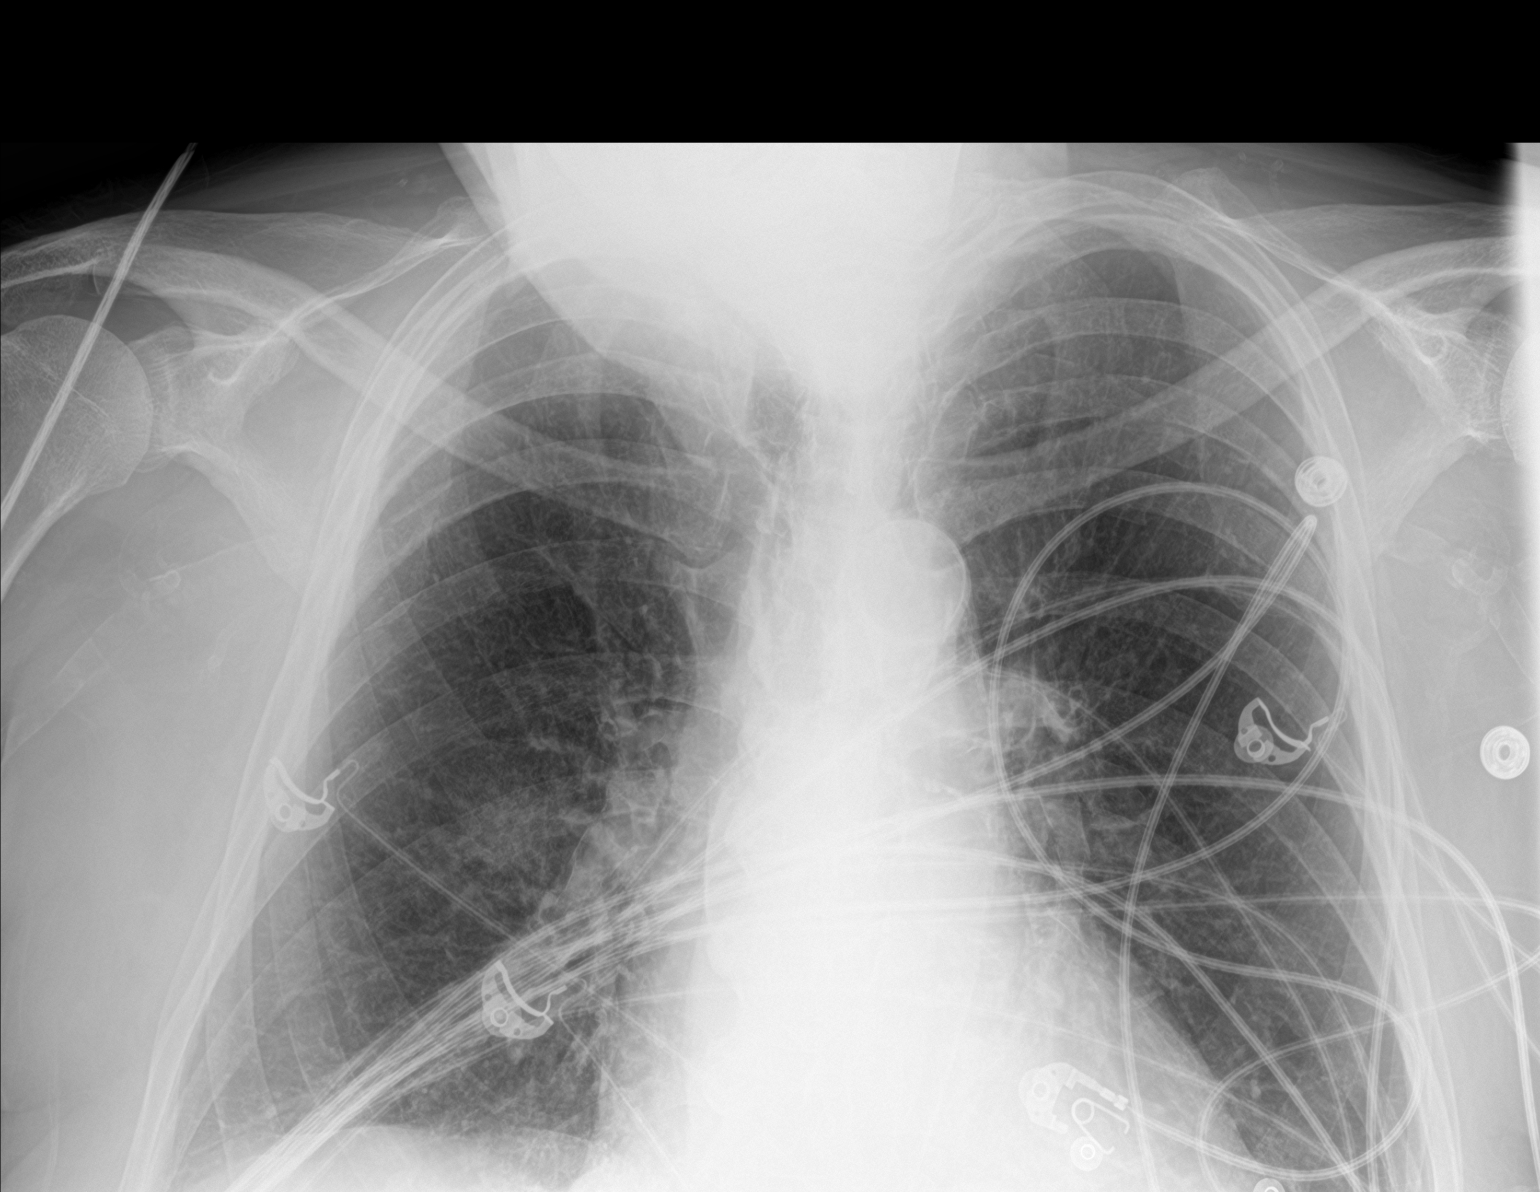

[chest ap (2 of 2)]
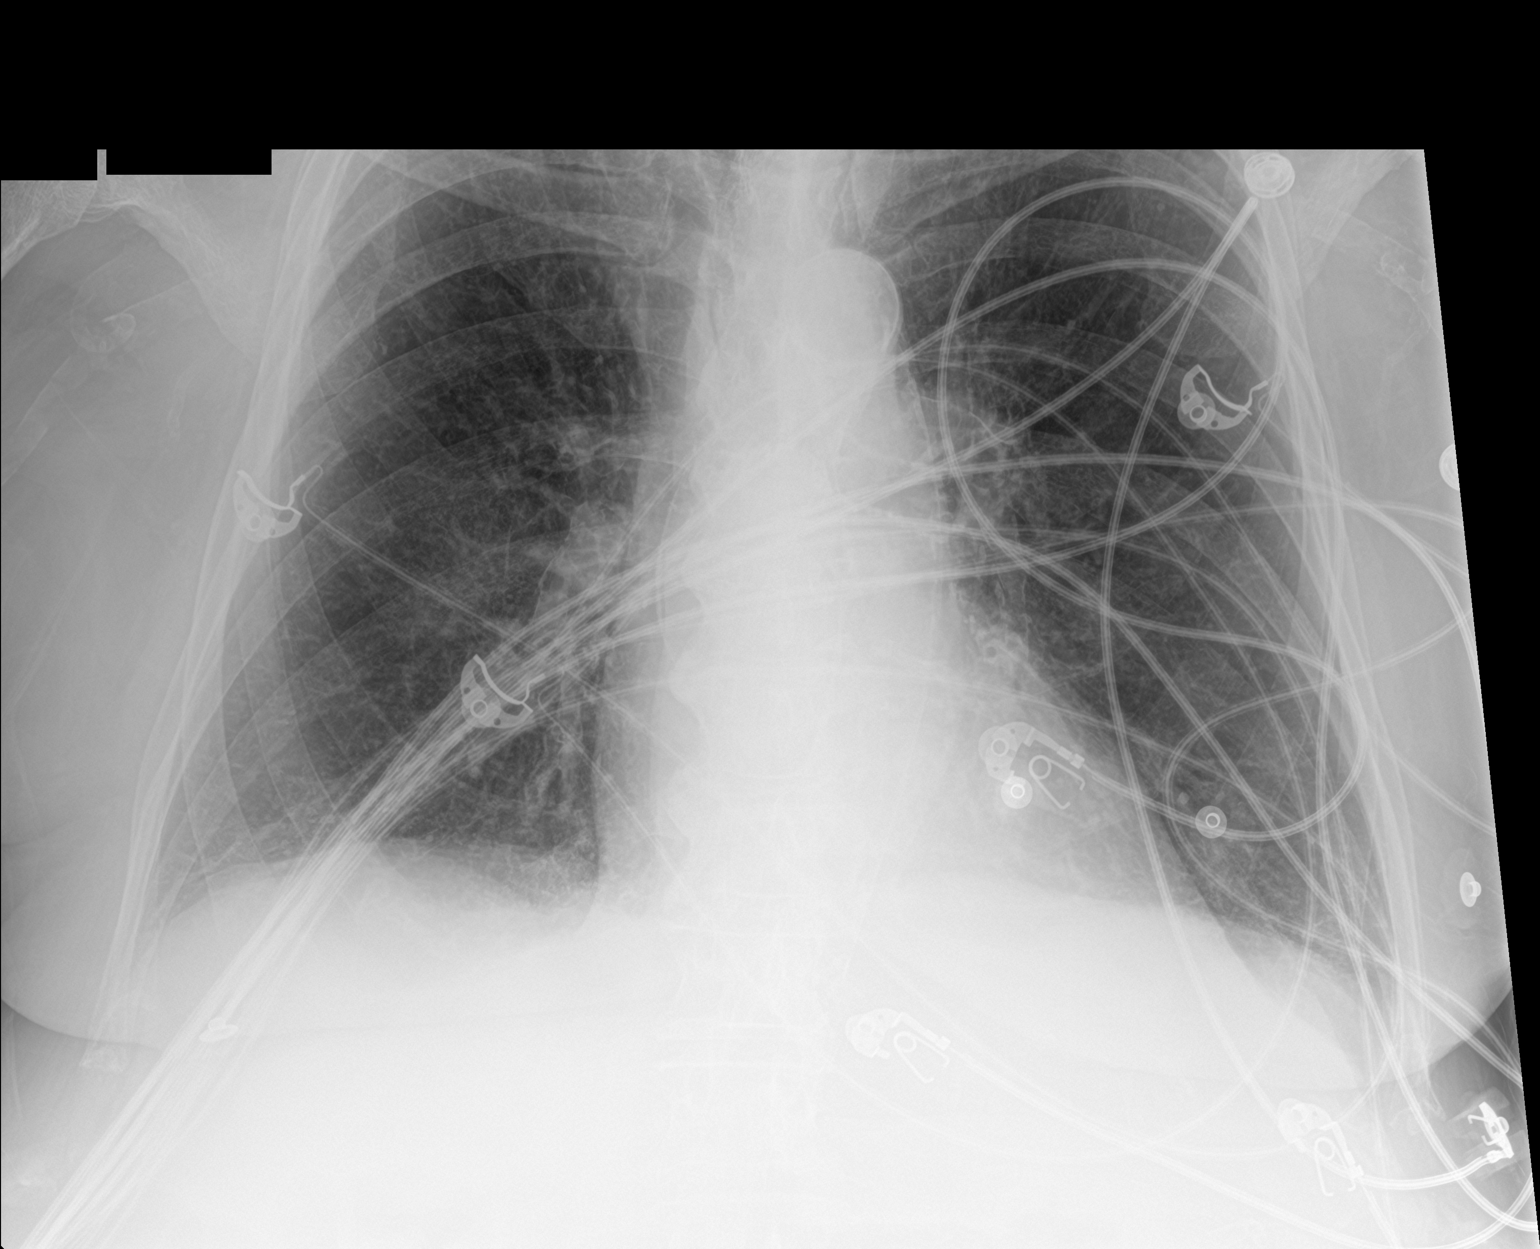

[3 of 3 positions shown; findings below may reference images not displayed]

FINDINGS: Normal cardiac silhouette. Aortic atherosclerosis with
calcification. No focal consolidation. No pleural effusion or
pneumothorax. No acute osseous abnormality is evident.
IMPRESSION: No acute pulmonary process identified.

By: Savio Locklear M.D.

## 2018-09-29 IMAGING — CR DG CHEST 2V
2 series · 2 of 2 positions shown · non-contrast
Comparison: Prior radiograph from 11/11/2017.

CLINICAL DATA: Initial evaluation for acute upper chest pain.

EXAM:
CHEST  2 VIEW

[chest lat]
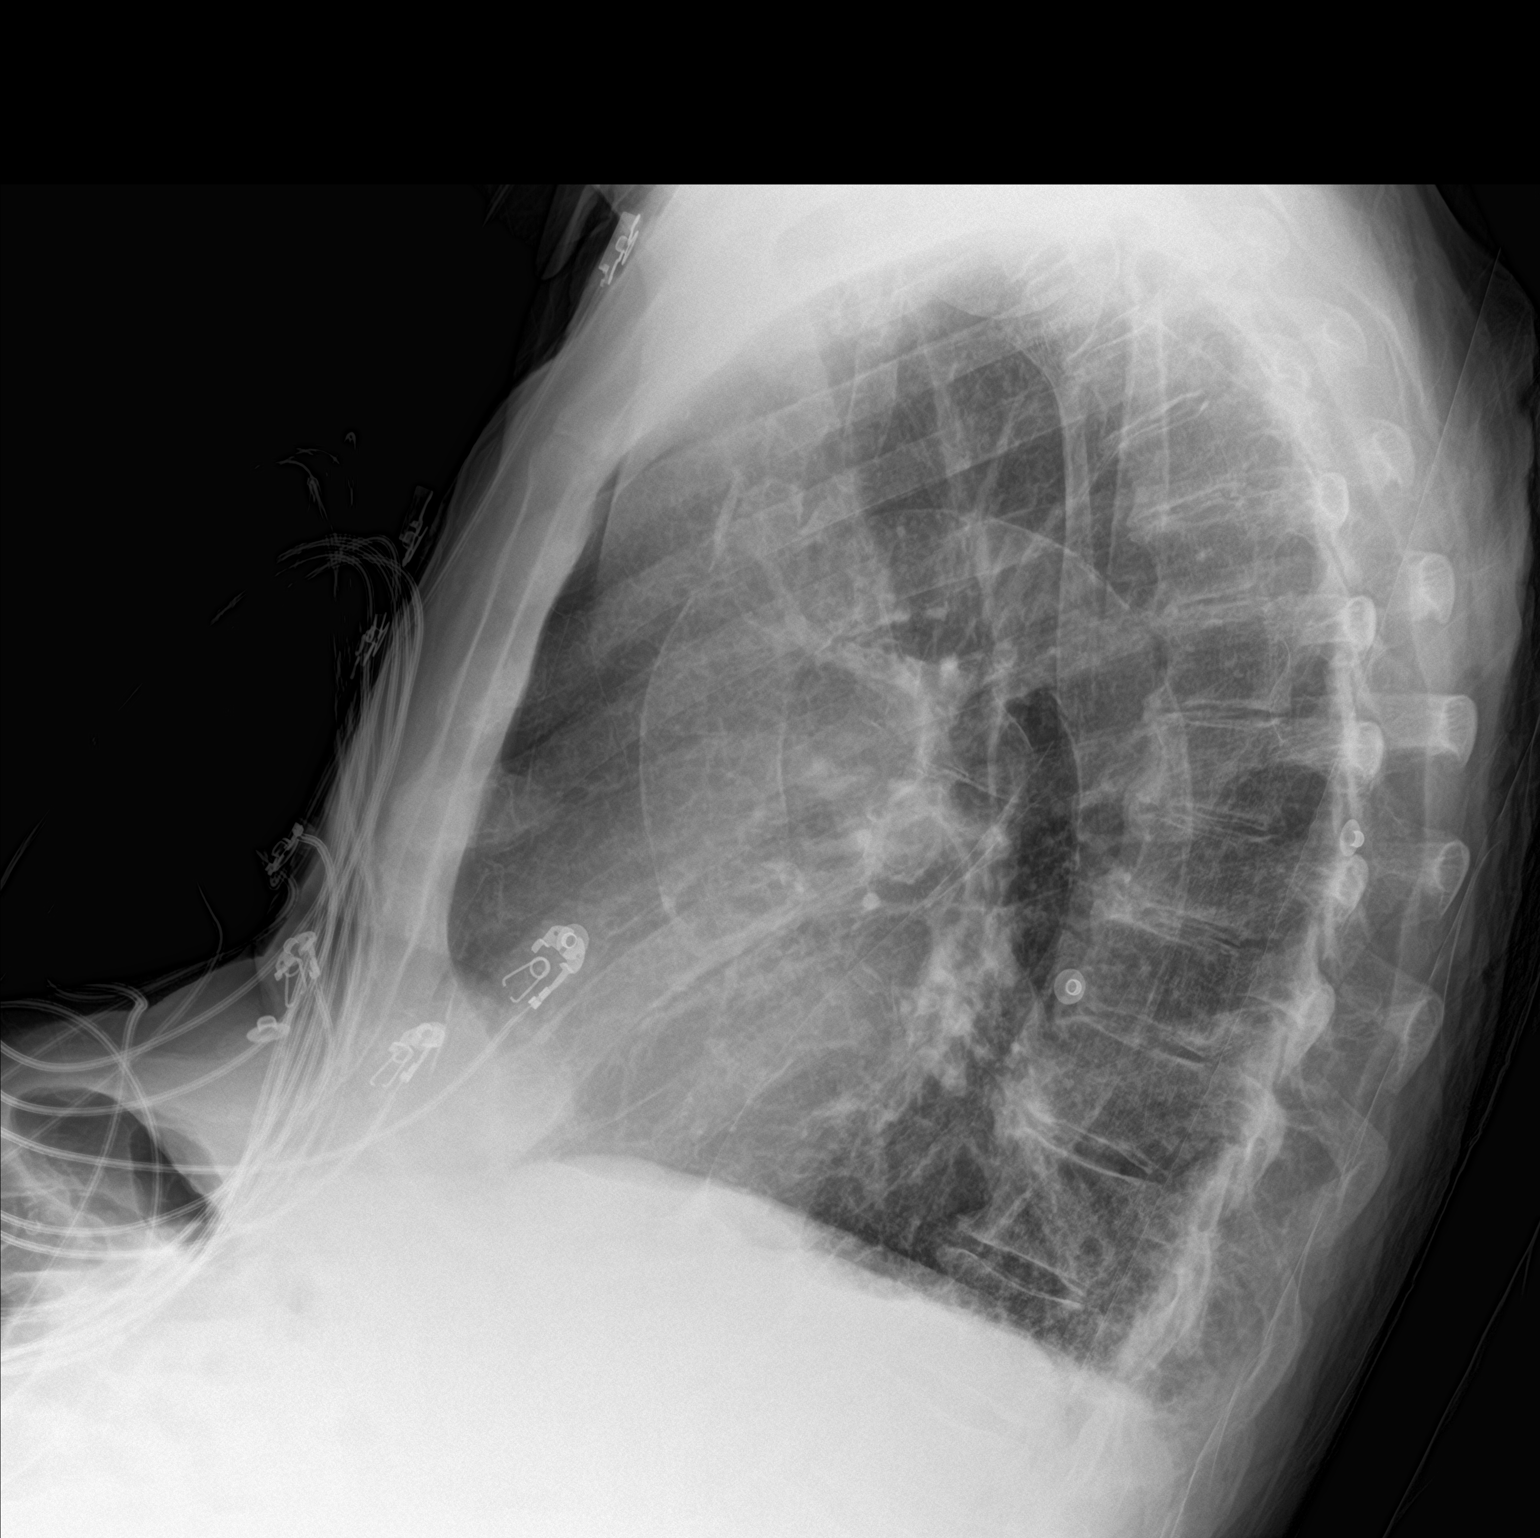

[chest ap]
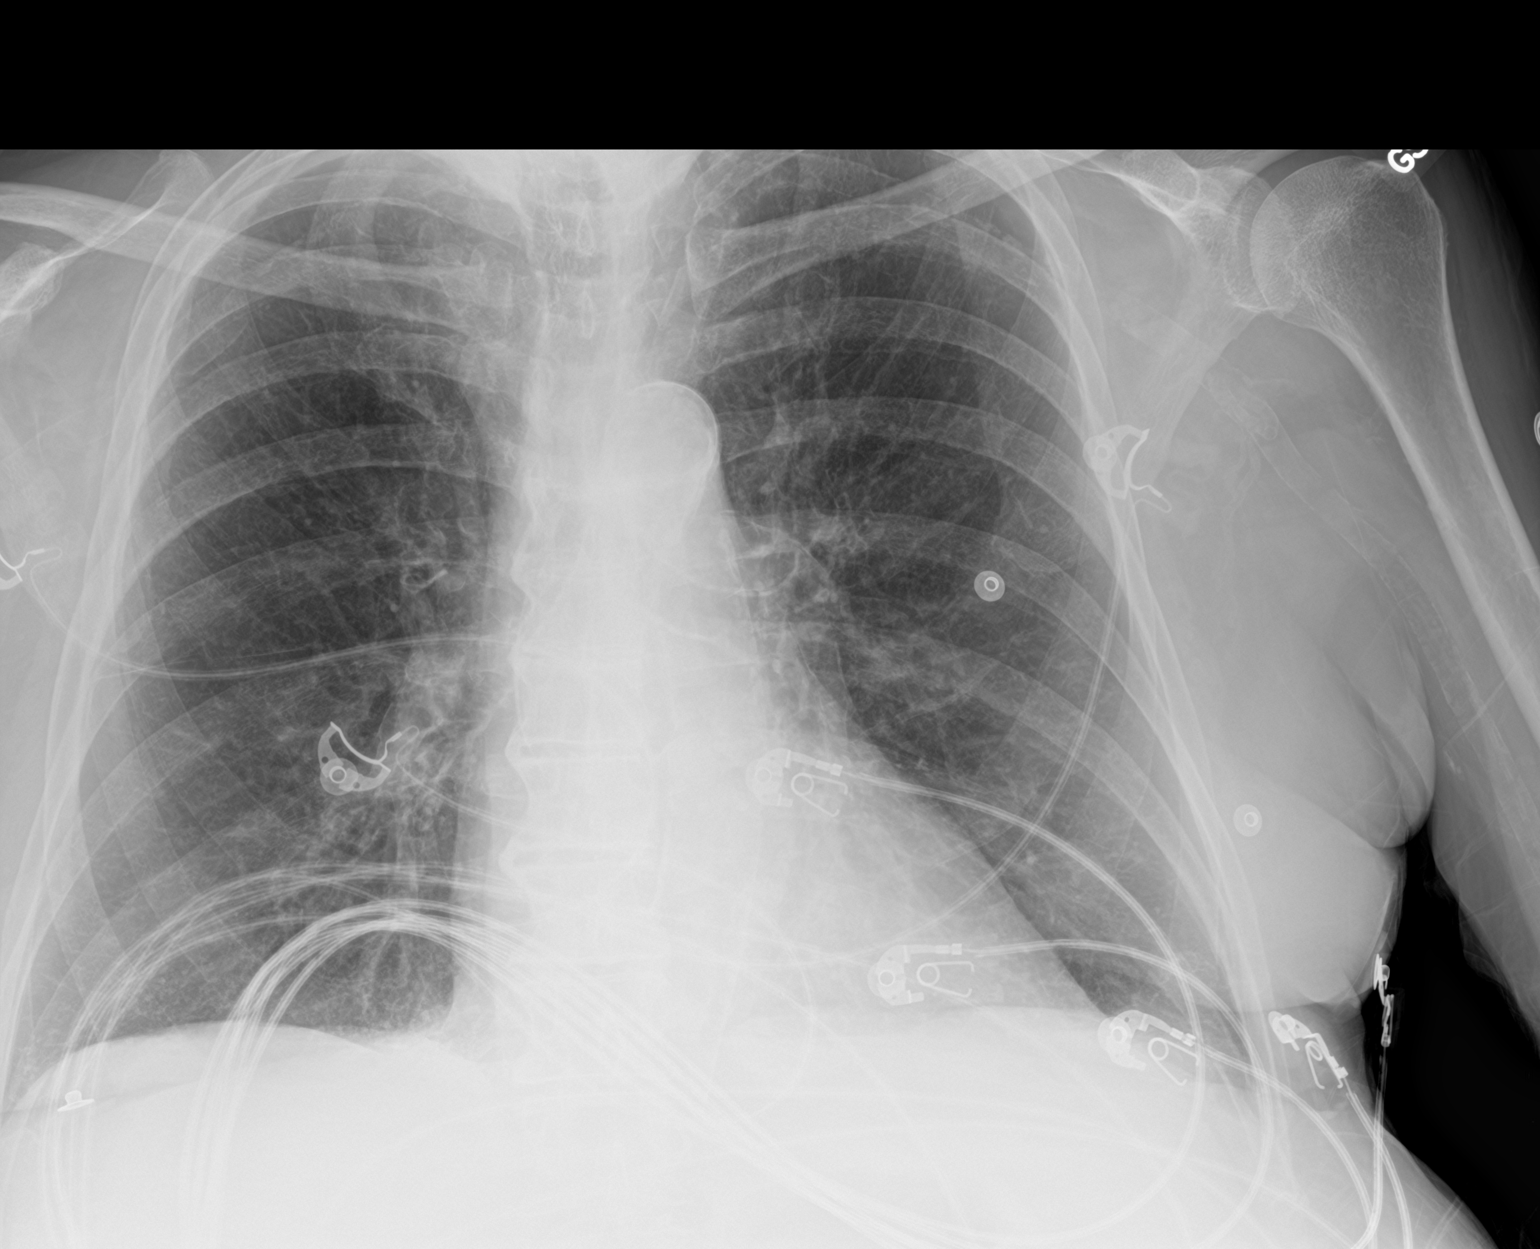

[2 of 2 positions shown; findings below may reference images not displayed]

FINDINGS: Mild cardiomegaly, stable. Mediastinal silhouette within normal
limits. Extensive aortic atherosclerosis again noted. Extensive
peripheral calcifications noted as well.

Lungs normally inflated. Mild bibasilar scattered bibasilar scarring
and/or fibrosis.. No focal infiltrates. No pulmonary edema or
pleural effusion. No pneumothorax.

No acute osseous abnormality.  Osteopenia noted.
IMPRESSION: 1. Stable appearance of the chest with no active cardiopulmonary
disease identified.
2. Extensive calcific aortic atherosclerosis.

## 2018-10-04 IMAGING — CR DG CHEST 2V
2 series · 2 of 2 positions shown · non-contrast
Comparison: 11/16/2017

CLINICAL DATA: Mid chest pain tonight.

EXAM:
CHEST  2 VIEW

[chest lat]
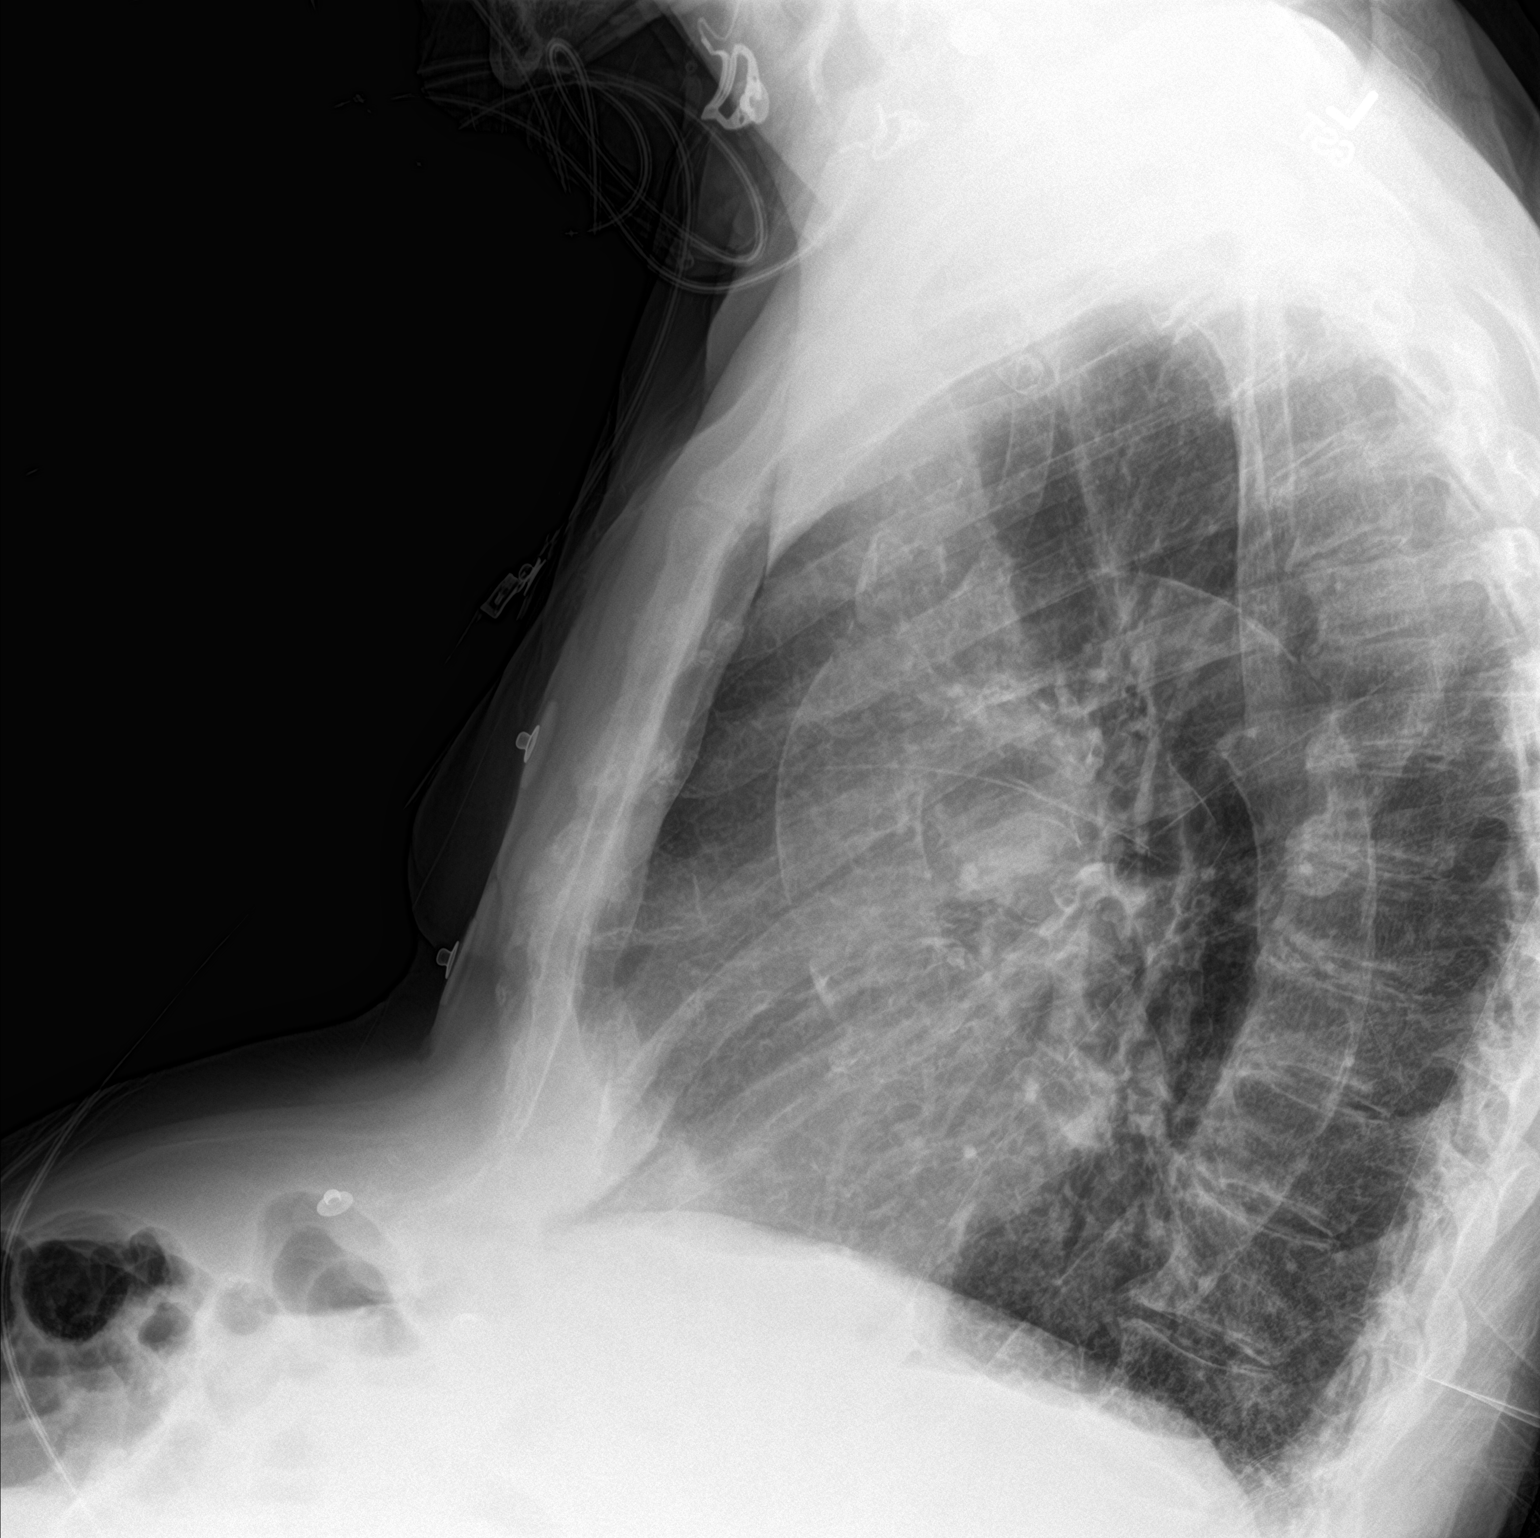

[chest ap]
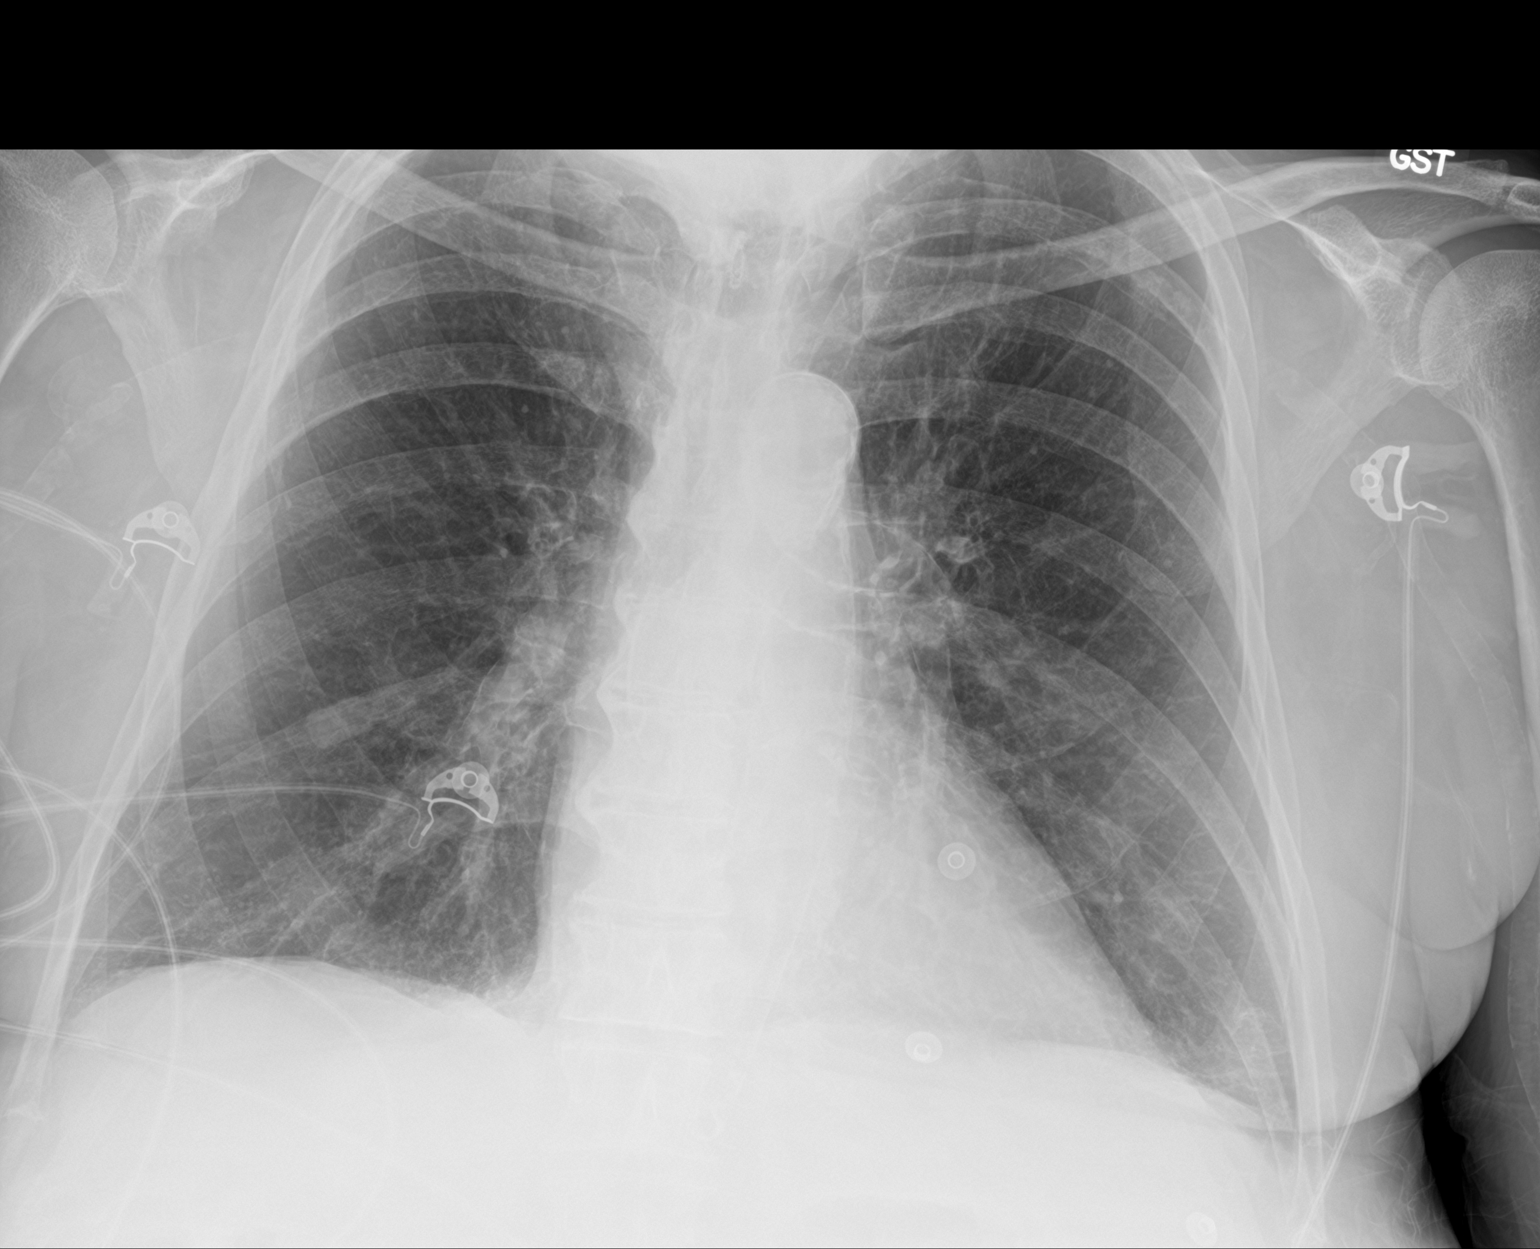

[2 of 2 positions shown; findings below may reference images not displayed]

FINDINGS: Mild hyperinflation. Normal heart size and pulmonary vascularity. No
focal airspace disease or consolidation in the lungs. No blunting of
costophrenic angles. No pneumothorax. Mediastinal contours appear
intact. Diffuse calcification of the aorta.
IMPRESSION: No evidence of active pulmonary disease. Hyperinflation suggesting
emphysema. Prominent aortic atherosclerosis.
